# Patient Record
Sex: Female | Born: 1935 | Race: White | Hispanic: No | Smoking: Former smoker
Health system: Southern US, Community
[De-identification: ages and names within clinical notes are randomized; demographics above are authoritative.]

## PROBLEM LIST (undated history)

## (undated) DIAGNOSIS — I639 Cerebral infarction, unspecified: Secondary | ICD-10-CM

## (undated) DIAGNOSIS — E032 Hypothyroidism due to medicaments and other exogenous substances: Secondary | ICD-10-CM

## (undated) DIAGNOSIS — H409 Unspecified glaucoma: Secondary | ICD-10-CM

## (undated) DIAGNOSIS — I10 Essential (primary) hypertension: Secondary | ICD-10-CM

## (undated) DIAGNOSIS — I4891 Unspecified atrial fibrillation: Principal | ICD-10-CM

## (undated) DIAGNOSIS — C801 Malignant (primary) neoplasm, unspecified: Secondary | ICD-10-CM

## (undated) DIAGNOSIS — E079 Disorder of thyroid, unspecified: Secondary | ICD-10-CM

## (undated) HISTORY — PX: THYROIDECTOMY: SHX17

## (undated) HISTORY — DX: Unspecified glaucoma: H40.9

## (undated) HISTORY — PX: ABDOMINAL HYSTERECTOMY: SHX81

## (undated) HISTORY — DX: Unspecified atrial fibrillation: I48.91

## (undated) HISTORY — PX: APPENDECTOMY: SHX54

## (undated) HISTORY — DX: Hypothyroidism due to medicaments and other exogenous substances: E03.2

## (undated) HISTORY — PX: CHOLECYSTECTOMY: SHX55

## (undated) HISTORY — PX: VARICOSE VEIN SURGERY: SHX832

---

## 1998-04-12 ENCOUNTER — Ambulatory Visit (HOSPITAL_COMMUNITY): Admission: RE | Admit: 1998-04-12 | Discharge: 1998-04-12 | Payer: Self-pay | Admitting: Neurosurgery

## 1998-10-30 ENCOUNTER — Emergency Department (HOSPITAL_COMMUNITY): Admission: EM | Admit: 1998-10-30 | Discharge: 1998-10-30 | Payer: Self-pay | Admitting: Emergency Medicine

## 1998-10-30 ENCOUNTER — Encounter: Payer: Self-pay | Admitting: Emergency Medicine

## 1999-03-22 ENCOUNTER — Encounter: Payer: Self-pay | Admitting: Neurosurgery

## 1999-03-22 ENCOUNTER — Ambulatory Visit (HOSPITAL_COMMUNITY): Admission: RE | Admit: 1999-03-22 | Discharge: 1999-03-22 | Payer: Self-pay | Admitting: Neurosurgery

## 2000-05-02 ENCOUNTER — Other Ambulatory Visit: Admission: RE | Admit: 2000-05-02 | Discharge: 2000-05-02 | Payer: Self-pay | Admitting: Internal Medicine

## 2000-05-04 ENCOUNTER — Encounter: Payer: Self-pay | Admitting: Neurosurgery

## 2000-05-04 ENCOUNTER — Ambulatory Visit (HOSPITAL_COMMUNITY): Admission: RE | Admit: 2000-05-04 | Discharge: 2000-05-04 | Payer: Self-pay | Admitting: Neurosurgery

## 2001-03-18 ENCOUNTER — Ambulatory Visit (HOSPITAL_COMMUNITY): Admission: RE | Admit: 2001-03-18 | Discharge: 2001-03-18 | Payer: Self-pay | Admitting: Gastroenterology

## 2001-03-18 ENCOUNTER — Encounter (INDEPENDENT_AMBULATORY_CARE_PROVIDER_SITE_OTHER): Payer: Self-pay | Admitting: Specialist

## 2001-05-28 ENCOUNTER — Encounter: Payer: Self-pay | Admitting: Neurosurgery

## 2001-05-28 ENCOUNTER — Ambulatory Visit (HOSPITAL_COMMUNITY): Admission: RE | Admit: 2001-05-28 | Discharge: 2001-05-28 | Payer: Self-pay | Admitting: Neurosurgery

## 2001-10-14 DIAGNOSIS — I639 Cerebral infarction, unspecified: Secondary | ICD-10-CM

## 2001-10-14 HISTORY — DX: Cerebral infarction, unspecified: I63.9

## 2002-02-18 ENCOUNTER — Inpatient Hospital Stay (HOSPITAL_COMMUNITY)
Admission: AD | Admit: 2002-02-18 | Discharge: 2002-03-24 | Payer: Self-pay | Admitting: Physical Medicine & Rehabilitation

## 2002-02-27 ENCOUNTER — Encounter: Payer: Self-pay | Admitting: Physical Medicine & Rehabilitation

## 2002-03-03 ENCOUNTER — Encounter: Payer: Self-pay | Admitting: Physical Medicine & Rehabilitation

## 2002-03-17 ENCOUNTER — Encounter: Payer: Self-pay | Admitting: Physical Medicine & Rehabilitation

## 2002-03-18 ENCOUNTER — Encounter: Payer: Self-pay | Admitting: Physical Medicine & Rehabilitation

## 2002-04-02 ENCOUNTER — Encounter: Payer: Self-pay | Admitting: Physical Medicine & Rehabilitation

## 2002-04-02 ENCOUNTER — Ambulatory Visit (HOSPITAL_COMMUNITY)
Admission: RE | Admit: 2002-04-02 | Discharge: 2002-04-02 | Payer: Self-pay | Admitting: Physical Medicine & Rehabilitation

## 2002-05-21 ENCOUNTER — Ambulatory Visit (HOSPITAL_COMMUNITY): Admission: RE | Admit: 2002-05-21 | Discharge: 2002-05-21 | Payer: Self-pay | Admitting: Neurosurgery

## 2002-05-21 ENCOUNTER — Encounter: Payer: Self-pay | Admitting: Neurosurgery

## 2002-09-16 ENCOUNTER — Encounter: Admission: RE | Admit: 2002-09-16 | Discharge: 2002-09-16 | Payer: Self-pay | Admitting: Internal Medicine

## 2002-09-16 ENCOUNTER — Encounter: Payer: Self-pay | Admitting: Internal Medicine

## 2003-06-06 ENCOUNTER — Encounter: Payer: Self-pay | Admitting: Neurosurgery

## 2003-06-06 ENCOUNTER — Ambulatory Visit (HOSPITAL_COMMUNITY): Admission: RE | Admit: 2003-06-06 | Discharge: 2003-06-06 | Payer: Self-pay | Admitting: Neurosurgery

## 2005-06-05 ENCOUNTER — Ambulatory Visit (HOSPITAL_COMMUNITY): Admission: RE | Admit: 2005-06-05 | Discharge: 2005-06-05 | Payer: Self-pay | Admitting: Neurosurgery

## 2007-06-02 ENCOUNTER — Encounter: Admission: RE | Admit: 2007-06-02 | Discharge: 2007-06-02 | Payer: Self-pay | Admitting: General Surgery

## 2009-09-12 ENCOUNTER — Ambulatory Visit (HOSPITAL_COMMUNITY): Admission: RE | Admit: 2009-09-12 | Discharge: 2009-09-13 | Payer: Self-pay | Admitting: Ophthalmology

## 2010-11-04 ENCOUNTER — Encounter: Payer: Self-pay | Admitting: Neurosurgery

## 2011-01-16 LAB — CBC
HCT: 42.9 % (ref 36.0–46.0)
MCV: 92.5 fL (ref 78.0–100.0)
Platelets: 247 10*3/uL (ref 150–400)
RDW: 12.8 % (ref 11.5–15.5)

## 2011-01-16 LAB — BASIC METABOLIC PANEL
BUN: 22 mg/dL (ref 6–23)
GFR calc non Af Amer: >60 mL/min (ref >60)
Glucose, Bld: 107 mg/dL — ABNORMAL HIGH (ref 70–99)
Potassium: 4.4 mEq/L (ref 3.5–5.1)

## 2011-03-01 NOTE — Consult Note (Signed)
[place]. [facility]  Patient:    Jillian Cantrell, Jillian Cantrell Visit Number: [id number] MRN: [id number]          Service Type: [facility] Location: 4000 [id number] Attending Physician:  Herold Harms Dictated by:   Georges Lynch Darrelyn Hillock, M.D. Proc. Date: 02/28/02 Admit Date:  02/18/2002                            Consultation Report  REASON FOR CONSULTATION:  I was called to see this patient today by Dr. Thomasena Edis from rehab on Feb 28, 2002. Apparently, she had a CVA that involved her right side. She had pain in her right shoulder. An x-ray was taken that showed a probable nondisplaced fracture of the humeral neck.  PAST MEDICAL HISTORY:  The past history is all well documented on the chart.  PHYSICAL EXAMINATION:  GENERAL:  From the orthopedic standpoint, she is unable to speak. Her husband is there to help [place] interpret as far as her problems.  RIGHT UPPER EXTREMITY:  She complained of pain in her right shoulder. She was unable to move her right upper extremity about. Her circulation in her hand is fine. In the right shoulder exam per se, there are no masses or tumors noted. No signs of any gross hematomas or swelling.  IMAGING STUDIES:  X-rays were suspicious for a nondisplaced humeral neck fracture on the right.  IMPRESSION:  A nondisplaced humeral neck fracture on the right.  TREATMENT:  Keep her in a shoulder mobilizer that she will need to be kept in for a few weeks, and we will follow her. Dictated by:   Georges Lynch Darrelyn Hillock, M.D. Attending Physician:  Herold Harms DD:  02/28/02 TD:  03/02/02 Job: [id number]

## 2011-03-01 NOTE — Discharge Summary (Signed)
[place]. [facility]  Patient:    Jillian Cantrell, Jillian Cantrell Visit Number: [id number] MRN: [id number]          Service Type: [facility] Location: 4000 [id number] Attending Physician:  Herold Harms Dictated by:   Mcarthur Rossetti. Angiulli, P.A. Admit Date:  02/18/2002 Disc. Date: 03/24/02   CC:         Georges Lynch. Darrelyn Hillock, M.D.   Discharge Summary  DISCHARGE DIAGNOSES: 1. 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 2. Aphasia with dysphasia secondary to cerebrovascular accident. 3. Hypertension. 4. Right humeral neck fracture. 5. Urinary tract infection, resolved. 6. Hypothyroidism. 7. Gastroesophageal reflux disease. 8. History of meningioma of the brain. 9. Osteoarthritis.  HISTORY OF PRESENT ILLNESS:  A 77 year old white female from [place], [place] admitted May 3 to [facility] in [place] with right-sided weakness.  The patient and husband had been repairing a beach home to rent for the summer.  On evaluation, cranial CT scan was negative for infarction or hemorrhage.  There was a question of an old stroke.  Follow-up cranial CT scan with left basal ganglia infarction, thalamic infarction.  She was placed on intravenous heparin.  Carotid duplex with 40% ICA stenosis. Echocardiogram with no thrombus.  Maintained on Plavix and subcutaneous Lovenox.  Modified Barium swallow showed aspiration of liquids and a nasogastric feeding was placed.  She was also on a pureed, honey-thick liquid diet.  She was placed on intravenous Cipro for a positive urinalysis.  Blood pressures monitored.  She was maximum assistance for sit to stand.  Latest chemistry was unremarkable.  She did have some complaints of right shoulder pain, where initial x-rays by report were negative.  She was admitted for comprehensive rehabilitation program.  PAST MEDICAL HISTORY: 1. Hypertension. 2. Hypothyroidism. 3. Gastroesophageal reflux disease. 4. History of  thyroid carcinoma, which she had no treatment per her son. 5. History of meningioma of the brain per son.  There was no surgery. She had    been seen by Dr. Ronaldo Miyamoto L. Cabbell of Neurosurgery at [facility] in    the past. 6. Osteoarthritis.  ALLERGIES:  IVP dye.  SOCIAL HISTORY:  She quit smoking 40 years ago, no alcohol.  Lives with husband in [place], [place].  Independent prior to admission.  One level home, four steps to entry.  Local son and daughter, can assist as needed on discharge.  MEDICATIONS:  (Prior to admission.) 1. Synthroid. 2. Hydrochlorothiazide. 3. Celebrex. 4. Remeron. 5. Ziac. 6. Aciphex.  HOSPITAL COURSE:  The patient with progressive ______ and rehabilitation services with therapies initiated on a b.i.d. basis.  The following issues were followed during patients ______ .  Pertaining to [name] left cerebrovascular accident remains stable.  Right-sided weakness with slow progress.  She continued on Plavix therapy.  Subcutaneous Lovenox was used for deep vein thrombosis throughout her rehabilitation course.  This was discontinued at time of discharge.  She was aphasic due to cerebrovascular accident.  She was able to use some simple words, answer yes, no appropriately.  She was essentially showing expressive aphasia.  She was followed by speech therapy for both her speech difficulties, as well as swallowing difficulties.  She was initially on nasogastric feeding tubes for aspiration secondary to cerebrovascular accident.  Her diet was slowly advanced.  Her nasogastric tube had been removed. She was on intravenous fluids for a short time to maintain hydration.  The latest modified Barium swallow on June 5 as her diet  was now advanced to a regular consistency with a nectar-thick liquids.  Intravenous have since been discontinued.  Follow-up chemistry showed the patient to be maintained with hydration.  She would receive follow-up speech  therapy for ongoing swallowing assessments.  Her blood pressures remained controlled throughout her rehabilitation course on a combination of Ziac and Vasotec.  There was no orthostatic changes.  She had completed a course of Cipro upon her admission to rehabilitation service for an E.coli urinary tract infection.  Follow-up urinalysis was negative. Ongoing complaints of right shoulder pain.  Initial x-rays at [facility] were negative.  Follow-up films at [facility] did reveal a nondisplaced right humeral neck fracture.  Orthopedic services were consulted on May 18, Dr. Windy Fast A. Gioffre, advisement of conservative care with shoulder sling.  Follow-up films March 17, 2002 showed healing of nondisplaced right humeral neck fracture.  She would follow up with orthopedic services for consideration of gentle range of motion.  This would be assessed as outpatient.  She continued her hormone supplement for hypothyroidism.  She had no gastrointestinal complaints.  She was maintained on Protonix with a history of gastroesophageal reflux disease.  Ongoing osteoarthritis, rheumatoid arthritis, low doses of Vioxx had been resumed.  Overall, for her functional mobility, she was minimum to moderate assist in all areas of activities of daily living of dressing and grooming.  She was maximum assist with a large base quad cane for her ambulation.  Supervision to minimal assist for her wheelchair mobility.  Full family teaching was completed.  Day passes went well with family.  Home therapies would continue to be addressed, as well as follow-up at the outpatient rehabilitation center with Dr. Rande Brunt. Collins.  A Foley catheter tube had remained in place until patients mobility was improved.  This was later removed with routine voiding schedules.  It was advised to family the need for routine skin checks.  Latest laboratories June 6 showed a sodium 141, potassium 4.0, BUN 17, creatinine 0.7,  hemoglobin 13.4, hematocrit 38.2.  DISCHARGE MEDICATIONS:  1. Plavix 75 mg daily. 2. Ziac 2.5-6.25 daily. 3. Vasotec 5 mg daily. 4. Synthroid 175 mcg daily. 5. Protonix 40 mg daily. 6. Zocor 20 mg daily. 7. Vioxx 12.5 mg daily. 8. Ensure pudding three times daily. 9. Tylenol as needed.  ACTIVITY:  As tolerated with assistance for safety.  DIET:  Regular consistency, nectar-thick liquids with chin tuck.  SPECIAL INSTRUCTIONS:  The patient should follow up with Dr. Georges Lynch. Gioffre of Orthopedic Services for nondisplaced right humeral neck fracture with shoulder sling in place for consideration for gentle range of motion.  The patient will see Dr. Reuel Boom L. Collins at the Outpatient Rehabilitation Service, [facility] [phone] with appointment to be made.  She should be maintained on current diet as noted above with follow-up speech therapy for repeat swallowing evaluation.  The patient was discharged to a skilled nursing facility on March 24, 2002. Dictated by:   Mcarthur Rossetti. Angiulli, P.A. Attending Physician:  Herold Harms DD:  03/23/02 TD:  03/23/02 Job: [id number]

## 2011-07-01 ENCOUNTER — Encounter (INDEPENDENT_AMBULATORY_CARE_PROVIDER_SITE_OTHER): Payer: Medicare Other | Admitting: Ophthalmology

## 2011-07-01 DIAGNOSIS — H35039 Hypertensive retinopathy, unspecified eye: Secondary | ICD-10-CM

## 2011-07-01 DIAGNOSIS — H35349 Macular cyst, hole, or pseudohole, unspecified eye: Secondary | ICD-10-CM

## 2011-07-01 DIAGNOSIS — H43819 Vitreous degeneration, unspecified eye: Secondary | ICD-10-CM

## 2011-10-22 DIAGNOSIS — H409 Unspecified glaucoma: Secondary | ICD-10-CM | POA: Diagnosis not present

## 2011-10-22 DIAGNOSIS — H4011X0 Primary open-angle glaucoma, stage unspecified: Secondary | ICD-10-CM | POA: Diagnosis not present

## 2011-12-13 DIAGNOSIS — E039 Hypothyroidism, unspecified: Secondary | ICD-10-CM | POA: Diagnosis not present

## 2011-12-13 DIAGNOSIS — E785 Hyperlipidemia, unspecified: Secondary | ICD-10-CM | POA: Diagnosis not present

## 2011-12-13 DIAGNOSIS — I69959 Hemiplegia and hemiparesis following unspecified cerebrovascular disease affecting unspecified side: Secondary | ICD-10-CM | POA: Diagnosis not present

## 2011-12-13 DIAGNOSIS — I10 Essential (primary) hypertension: Secondary | ICD-10-CM | POA: Diagnosis not present

## 2012-02-26 DIAGNOSIS — H409 Unspecified glaucoma: Secondary | ICD-10-CM | POA: Diagnosis not present

## 2012-02-26 DIAGNOSIS — H4011X0 Primary open-angle glaucoma, stage unspecified: Secondary | ICD-10-CM | POA: Diagnosis not present

## 2012-03-30 ENCOUNTER — Encounter (INDEPENDENT_AMBULATORY_CARE_PROVIDER_SITE_OTHER): Payer: Medicare Other | Admitting: Ophthalmology

## 2012-03-30 DIAGNOSIS — H43819 Vitreous degeneration, unspecified eye: Secondary | ICD-10-CM

## 2012-03-30 DIAGNOSIS — H35379 Puckering of macula, unspecified eye: Secondary | ICD-10-CM | POA: Diagnosis not present

## 2012-03-30 DIAGNOSIS — I10 Essential (primary) hypertension: Secondary | ICD-10-CM

## 2012-03-30 DIAGNOSIS — H35359 Cystoid macular degeneration, unspecified eye: Secondary | ICD-10-CM | POA: Diagnosis not present

## 2012-03-30 DIAGNOSIS — H35039 Hypertensive retinopathy, unspecified eye: Secondary | ICD-10-CM | POA: Diagnosis not present

## 2012-05-06 DIAGNOSIS — H16229 Keratoconjunctivitis sicca, not specified as Sjogren's, unspecified eye: Secondary | ICD-10-CM | POA: Diagnosis not present

## 2012-05-06 DIAGNOSIS — H5200 Hypermetropia, unspecified eye: Secondary | ICD-10-CM | POA: Diagnosis not present

## 2012-05-06 DIAGNOSIS — H04129 Dry eye syndrome of unspecified lacrimal gland: Secondary | ICD-10-CM | POA: Diagnosis not present

## 2012-05-06 DIAGNOSIS — H52229 Regular astigmatism, unspecified eye: Secondary | ICD-10-CM | POA: Diagnosis not present

## 2012-05-14 DIAGNOSIS — N6489 Other specified disorders of breast: Secondary | ICD-10-CM | POA: Diagnosis not present

## 2012-06-05 DIAGNOSIS — C73 Malignant neoplasm of thyroid gland: Secondary | ICD-10-CM | POA: Diagnosis not present

## 2012-06-05 DIAGNOSIS — E785 Hyperlipidemia, unspecified: Secondary | ICD-10-CM | POA: Diagnosis not present

## 2012-06-05 DIAGNOSIS — I10 Essential (primary) hypertension: Secondary | ICD-10-CM | POA: Diagnosis not present

## 2012-06-05 DIAGNOSIS — R82998 Other abnormal findings in urine: Secondary | ICD-10-CM | POA: Diagnosis not present

## 2012-06-12 DIAGNOSIS — E785 Hyperlipidemia, unspecified: Secondary | ICD-10-CM | POA: Diagnosis not present

## 2012-06-12 DIAGNOSIS — I10 Essential (primary) hypertension: Secondary | ICD-10-CM | POA: Diagnosis not present

## 2012-06-12 DIAGNOSIS — E039 Hypothyroidism, unspecified: Secondary | ICD-10-CM | POA: Diagnosis not present

## 2012-06-12 DIAGNOSIS — Z0000 Encounter for general adult medical examination without abnormal findings: Secondary | ICD-10-CM | POA: Diagnosis not present

## 2012-06-16 DIAGNOSIS — Z23 Encounter for immunization: Secondary | ICD-10-CM | POA: Diagnosis not present

## 2012-06-16 DIAGNOSIS — Z1212 Encounter for screening for malignant neoplasm of rectum: Secondary | ICD-10-CM | POA: Diagnosis not present

## 2012-07-01 DIAGNOSIS — Z23 Encounter for immunization: Secondary | ICD-10-CM | POA: Diagnosis not present

## 2012-07-27 DIAGNOSIS — L02619 Cutaneous abscess of unspecified foot: Secondary | ICD-10-CM | POA: Diagnosis not present

## 2012-08-07 DIAGNOSIS — L84 Corns and callosities: Secondary | ICD-10-CM | POA: Diagnosis not present

## 2012-08-12 DIAGNOSIS — H52229 Regular astigmatism, unspecified eye: Secondary | ICD-10-CM | POA: Diagnosis not present

## 2012-08-12 DIAGNOSIS — Z961 Presence of intraocular lens: Secondary | ICD-10-CM | POA: Diagnosis not present

## 2012-08-12 DIAGNOSIS — H524 Presbyopia: Secondary | ICD-10-CM | POA: Diagnosis not present

## 2012-08-12 DIAGNOSIS — H5231 Anisometropia: Secondary | ICD-10-CM | POA: Diagnosis not present

## 2012-08-14 ENCOUNTER — Ambulatory Visit (INDEPENDENT_AMBULATORY_CARE_PROVIDER_SITE_OTHER): Payer: Medicare Other | Admitting: *Deleted

## 2012-08-14 DIAGNOSIS — I6350 Cerebral infarction due to unspecified occlusion or stenosis of unspecified cerebral artery: Secondary | ICD-10-CM

## 2012-08-14 DIAGNOSIS — Z8673 Personal history of transient ischemic attack (TIA), and cerebral infarction without residual deficits: Secondary | ICD-10-CM

## 2012-08-14 DIAGNOSIS — R9389 Abnormal findings on diagnostic imaging of other specified body structures: Secondary | ICD-10-CM

## 2012-10-01 DIAGNOSIS — H52229 Regular astigmatism, unspecified eye: Secondary | ICD-10-CM | POA: Diagnosis not present

## 2012-10-01 DIAGNOSIS — H5200 Hypermetropia, unspecified eye: Secondary | ICD-10-CM | POA: Diagnosis not present

## 2012-10-01 DIAGNOSIS — H524 Presbyopia: Secondary | ICD-10-CM | POA: Diagnosis not present

## 2012-10-01 DIAGNOSIS — H4011X0 Primary open-angle glaucoma, stage unspecified: Secondary | ICD-10-CM | POA: Diagnosis not present

## 2012-12-09 DIAGNOSIS — E039 Hypothyroidism, unspecified: Secondary | ICD-10-CM | POA: Diagnosis not present

## 2012-12-09 DIAGNOSIS — E785 Hyperlipidemia, unspecified: Secondary | ICD-10-CM | POA: Diagnosis not present

## 2012-12-09 DIAGNOSIS — I10 Essential (primary) hypertension: Secondary | ICD-10-CM | POA: Diagnosis not present

## 2012-12-09 DIAGNOSIS — Z1331 Encounter for screening for depression: Secondary | ICD-10-CM | POA: Diagnosis not present

## 2012-12-28 ENCOUNTER — Ambulatory Visit (INDEPENDENT_AMBULATORY_CARE_PROVIDER_SITE_OTHER): Payer: Medicare Other | Admitting: Ophthalmology

## 2012-12-28 DIAGNOSIS — H35379 Puckering of macula, unspecified eye: Secondary | ICD-10-CM

## 2012-12-28 DIAGNOSIS — H35359 Cystoid macular degeneration, unspecified eye: Secondary | ICD-10-CM

## 2012-12-28 DIAGNOSIS — H43819 Vitreous degeneration, unspecified eye: Secondary | ICD-10-CM

## 2012-12-28 DIAGNOSIS — I10 Essential (primary) hypertension: Secondary | ICD-10-CM

## 2012-12-28 DIAGNOSIS — H35039 Hypertensive retinopathy, unspecified eye: Secondary | ICD-10-CM

## 2013-02-02 DIAGNOSIS — H409 Unspecified glaucoma: Secondary | ICD-10-CM | POA: Diagnosis not present

## 2013-02-02 DIAGNOSIS — H4011X0 Primary open-angle glaucoma, stage unspecified: Secondary | ICD-10-CM | POA: Diagnosis not present

## 2013-05-05 DIAGNOSIS — I10 Essential (primary) hypertension: Secondary | ICD-10-CM | POA: Diagnosis not present

## 2013-05-05 DIAGNOSIS — Z6826 Body mass index (BMI) 26.0-26.9, adult: Secondary | ICD-10-CM | POA: Diagnosis not present

## 2013-05-05 DIAGNOSIS — R599 Enlarged lymph nodes, unspecified: Secondary | ICD-10-CM | POA: Diagnosis not present

## 2013-05-17 DIAGNOSIS — N6459 Other signs and symptoms in breast: Secondary | ICD-10-CM | POA: Diagnosis not present

## 2013-06-01 DIAGNOSIS — H409 Unspecified glaucoma: Secondary | ICD-10-CM | POA: Diagnosis not present

## 2013-06-01 DIAGNOSIS — H4011X0 Primary open-angle glaucoma, stage unspecified: Secondary | ICD-10-CM | POA: Diagnosis not present

## 2013-06-09 DIAGNOSIS — E785 Hyperlipidemia, unspecified: Secondary | ICD-10-CM | POA: Diagnosis not present

## 2013-06-09 DIAGNOSIS — I10 Essential (primary) hypertension: Secondary | ICD-10-CM | POA: Diagnosis not present

## 2013-06-09 DIAGNOSIS — R82998 Other abnormal findings in urine: Secondary | ICD-10-CM | POA: Diagnosis not present

## 2013-06-09 DIAGNOSIS — E039 Hypothyroidism, unspecified: Secondary | ICD-10-CM | POA: Diagnosis not present

## 2013-06-16 DIAGNOSIS — Z1212 Encounter for screening for malignant neoplasm of rectum: Secondary | ICD-10-CM | POA: Diagnosis not present

## 2013-06-16 DIAGNOSIS — I10 Essential (primary) hypertension: Secondary | ICD-10-CM | POA: Diagnosis not present

## 2013-06-16 DIAGNOSIS — M1990 Unspecified osteoarthritis, unspecified site: Secondary | ICD-10-CM | POA: Diagnosis not present

## 2013-06-16 DIAGNOSIS — E785 Hyperlipidemia, unspecified: Secondary | ICD-10-CM | POA: Diagnosis not present

## 2013-06-16 DIAGNOSIS — E039 Hypothyroidism, unspecified: Secondary | ICD-10-CM | POA: Diagnosis not present

## 2013-06-16 DIAGNOSIS — I69959 Hemiplegia and hemiparesis following unspecified cerebrovascular disease affecting unspecified side: Secondary | ICD-10-CM | POA: Diagnosis not present

## 2013-06-16 DIAGNOSIS — Z6826 Body mass index (BMI) 26.0-26.9, adult: Secondary | ICD-10-CM | POA: Diagnosis not present

## 2013-06-16 DIAGNOSIS — Z0000 Encounter for general adult medical examination without abnormal findings: Secondary | ICD-10-CM | POA: Diagnosis not present

## 2013-06-24 DIAGNOSIS — Z1212 Encounter for screening for malignant neoplasm of rectum: Secondary | ICD-10-CM | POA: Diagnosis not present

## 2013-07-29 DIAGNOSIS — Z23 Encounter for immunization: Secondary | ICD-10-CM | POA: Diagnosis not present

## 2013-10-09 DIAGNOSIS — L259 Unspecified contact dermatitis, unspecified cause: Secondary | ICD-10-CM | POA: Diagnosis not present

## 2013-10-19 DIAGNOSIS — H4010X0 Unspecified open-angle glaucoma, stage unspecified: Secondary | ICD-10-CM | POA: Diagnosis not present

## 2013-10-19 DIAGNOSIS — Z961 Presence of intraocular lens: Secondary | ICD-10-CM | POA: Diagnosis not present

## 2013-10-19 DIAGNOSIS — H4011X0 Primary open-angle glaucoma, stage unspecified: Secondary | ICD-10-CM | POA: Diagnosis not present

## 2013-11-22 DIAGNOSIS — I10 Essential (primary) hypertension: Secondary | ICD-10-CM | POA: Diagnosis not present

## 2013-11-22 DIAGNOSIS — R040 Epistaxis: Secondary | ICD-10-CM | POA: Diagnosis not present

## 2013-12-22 DIAGNOSIS — E039 Hypothyroidism, unspecified: Secondary | ICD-10-CM | POA: Diagnosis not present

## 2013-12-22 DIAGNOSIS — I10 Essential (primary) hypertension: Secondary | ICD-10-CM | POA: Diagnosis not present

## 2013-12-22 DIAGNOSIS — I69959 Hemiplegia and hemiparesis following unspecified cerebrovascular disease affecting unspecified side: Secondary | ICD-10-CM | POA: Diagnosis not present

## 2013-12-22 DIAGNOSIS — M1990 Unspecified osteoarthritis, unspecified site: Secondary | ICD-10-CM | POA: Diagnosis not present

## 2013-12-22 DIAGNOSIS — Z6825 Body mass index (BMI) 25.0-25.9, adult: Secondary | ICD-10-CM | POA: Diagnosis not present

## 2013-12-22 DIAGNOSIS — E785 Hyperlipidemia, unspecified: Secondary | ICD-10-CM | POA: Diagnosis not present

## 2013-12-22 DIAGNOSIS — Z1331 Encounter for screening for depression: Secondary | ICD-10-CM | POA: Diagnosis not present

## 2013-12-29 ENCOUNTER — Ambulatory Visit (INDEPENDENT_AMBULATORY_CARE_PROVIDER_SITE_OTHER): Payer: Self-pay | Admitting: Ophthalmology

## 2014-01-04 DIAGNOSIS — H52229 Regular astigmatism, unspecified eye: Secondary | ICD-10-CM | POA: Diagnosis not present

## 2014-01-04 DIAGNOSIS — H35379 Puckering of macula, unspecified eye: Secondary | ICD-10-CM | POA: Diagnosis not present

## 2014-01-04 DIAGNOSIS — H524 Presbyopia: Secondary | ICD-10-CM | POA: Diagnosis not present

## 2014-01-04 DIAGNOSIS — Z961 Presence of intraocular lens: Secondary | ICD-10-CM | POA: Diagnosis not present

## 2014-01-04 DIAGNOSIS — H5210 Myopia, unspecified eye: Secondary | ICD-10-CM | POA: Diagnosis not present

## 2014-02-17 ENCOUNTER — Ambulatory Visit (INDEPENDENT_AMBULATORY_CARE_PROVIDER_SITE_OTHER): Payer: Medicare Other | Admitting: Ophthalmology

## 2014-02-17 DIAGNOSIS — H35379 Puckering of macula, unspecified eye: Secondary | ICD-10-CM | POA: Diagnosis not present

## 2014-02-17 DIAGNOSIS — H43819 Vitreous degeneration, unspecified eye: Secondary | ICD-10-CM | POA: Diagnosis not present

## 2014-02-17 DIAGNOSIS — H35039 Hypertensive retinopathy, unspecified eye: Secondary | ICD-10-CM

## 2014-02-17 DIAGNOSIS — I10 Essential (primary) hypertension: Secondary | ICD-10-CM | POA: Diagnosis not present

## 2014-03-25 DIAGNOSIS — Z6826 Body mass index (BMI) 26.0-26.9, adult: Secondary | ICD-10-CM | POA: Diagnosis not present

## 2014-03-25 DIAGNOSIS — R0789 Other chest pain: Secondary | ICD-10-CM | POA: Diagnosis not present

## 2014-03-25 DIAGNOSIS — I69959 Hemiplegia and hemiparesis following unspecified cerebrovascular disease affecting unspecified side: Secondary | ICD-10-CM | POA: Diagnosis not present

## 2014-04-28 DIAGNOSIS — H409 Unspecified glaucoma: Secondary | ICD-10-CM | POA: Diagnosis not present

## 2014-04-28 DIAGNOSIS — H4011X0 Primary open-angle glaucoma, stage unspecified: Secondary | ICD-10-CM | POA: Diagnosis not present

## 2014-05-18 DIAGNOSIS — Z803 Family history of malignant neoplasm of breast: Secondary | ICD-10-CM | POA: Diagnosis not present

## 2014-05-18 DIAGNOSIS — Z1231 Encounter for screening mammogram for malignant neoplasm of breast: Secondary | ICD-10-CM | POA: Diagnosis not present

## 2014-06-01 DIAGNOSIS — M1990 Unspecified osteoarthritis, unspecified site: Secondary | ICD-10-CM | POA: Diagnosis not present

## 2014-06-01 DIAGNOSIS — H9190 Unspecified hearing loss, unspecified ear: Secondary | ICD-10-CM | POA: Diagnosis not present

## 2014-06-01 DIAGNOSIS — I10 Essential (primary) hypertension: Secondary | ICD-10-CM | POA: Diagnosis not present

## 2014-06-01 DIAGNOSIS — H6120 Impacted cerumen, unspecified ear: Secondary | ICD-10-CM | POA: Diagnosis not present

## 2014-06-01 DIAGNOSIS — Z6826 Body mass index (BMI) 26.0-26.9, adult: Secondary | ICD-10-CM | POA: Diagnosis not present

## 2014-06-14 DIAGNOSIS — R82998 Other abnormal findings in urine: Secondary | ICD-10-CM | POA: Diagnosis not present

## 2014-06-14 DIAGNOSIS — E785 Hyperlipidemia, unspecified: Secondary | ICD-10-CM | POA: Diagnosis not present

## 2014-06-14 DIAGNOSIS — E039 Hypothyroidism, unspecified: Secondary | ICD-10-CM | POA: Diagnosis not present

## 2014-06-14 DIAGNOSIS — I10 Essential (primary) hypertension: Secondary | ICD-10-CM | POA: Diagnosis not present

## 2014-06-24 DIAGNOSIS — I10 Essential (primary) hypertension: Secondary | ICD-10-CM | POA: Diagnosis not present

## 2014-06-24 DIAGNOSIS — I69959 Hemiplegia and hemiparesis following unspecified cerebrovascular disease affecting unspecified side: Secondary | ICD-10-CM | POA: Diagnosis not present

## 2014-06-24 DIAGNOSIS — Z0000 Encounter for general adult medical examination without abnormal findings: Secondary | ICD-10-CM | POA: Diagnosis not present

## 2014-06-24 DIAGNOSIS — E039 Hypothyroidism, unspecified: Secondary | ICD-10-CM | POA: Diagnosis not present

## 2014-06-24 DIAGNOSIS — Z6826 Body mass index (BMI) 26.0-26.9, adult: Secondary | ICD-10-CM | POA: Diagnosis not present

## 2014-06-24 DIAGNOSIS — E785 Hyperlipidemia, unspecified: Secondary | ICD-10-CM | POA: Diagnosis not present

## 2014-06-24 DIAGNOSIS — M1990 Unspecified osteoarthritis, unspecified site: Secondary | ICD-10-CM | POA: Diagnosis not present

## 2014-06-24 DIAGNOSIS — Z23 Encounter for immunization: Secondary | ICD-10-CM | POA: Diagnosis not present

## 2014-07-12 DIAGNOSIS — Z23 Encounter for immunization: Secondary | ICD-10-CM | POA: Diagnosis not present

## 2014-08-30 DIAGNOSIS — K432 Incisional hernia without obstruction or gangrene: Secondary | ICD-10-CM | POA: Diagnosis not present

## 2014-08-30 DIAGNOSIS — Z6826 Body mass index (BMI) 26.0-26.9, adult: Secondary | ICD-10-CM | POA: Diagnosis not present

## 2014-08-30 DIAGNOSIS — W19XXXA Unspecified fall, initial encounter: Secondary | ICD-10-CM | POA: Diagnosis not present

## 2014-11-14 ENCOUNTER — Ambulatory Visit: Payer: Medicare Other | Admitting: Podiatry

## 2014-11-23 DIAGNOSIS — H4011X2 Primary open-angle glaucoma, moderate stage: Secondary | ICD-10-CM | POA: Diagnosis not present

## 2014-12-21 DIAGNOSIS — Z6826 Body mass index (BMI) 26.0-26.9, adult: Secondary | ICD-10-CM | POA: Diagnosis not present

## 2014-12-21 DIAGNOSIS — M1990 Unspecified osteoarthritis, unspecified site: Secondary | ICD-10-CM | POA: Diagnosis not present

## 2014-12-21 DIAGNOSIS — E785 Hyperlipidemia, unspecified: Secondary | ICD-10-CM | POA: Diagnosis not present

## 2014-12-21 DIAGNOSIS — I69959 Hemiplegia and hemiparesis following unspecified cerebrovascular disease affecting unspecified side: Secondary | ICD-10-CM | POA: Diagnosis not present

## 2014-12-21 DIAGNOSIS — I10 Essential (primary) hypertension: Secondary | ICD-10-CM | POA: Diagnosis not present

## 2014-12-21 DIAGNOSIS — E039 Hypothyroidism, unspecified: Secondary | ICD-10-CM | POA: Diagnosis not present

## 2014-12-21 DIAGNOSIS — Z1389 Encounter for screening for other disorder: Secondary | ICD-10-CM | POA: Diagnosis not present

## 2015-01-07 ENCOUNTER — Emergency Department (HOSPITAL_COMMUNITY): Payer: Medicare Other

## 2015-01-07 ENCOUNTER — Encounter (HOSPITAL_COMMUNITY): Payer: Self-pay | Admitting: *Deleted

## 2015-01-07 ENCOUNTER — Emergency Department (HOSPITAL_COMMUNITY)
Admission: EM | Admit: 2015-01-07 | Discharge: 2015-01-07 | Disposition: A | Discharge status: Home / Self Care | Payer: Medicare Other | Attending: Emergency Medicine | Admitting: Emergency Medicine

## 2015-01-07 DIAGNOSIS — S59901A Unspecified injury of right elbow, initial encounter: Secondary | ICD-10-CM | POA: Diagnosis not present

## 2015-01-07 DIAGNOSIS — E079 Disorder of thyroid, unspecified: Secondary | ICD-10-CM | POA: Insufficient documentation

## 2015-01-07 DIAGNOSIS — Z87891 Personal history of nicotine dependence: Secondary | ICD-10-CM | POA: Diagnosis not present

## 2015-01-07 DIAGNOSIS — S3991XA Unspecified injury of abdomen, initial encounter: Secondary | ICD-10-CM | POA: Diagnosis not present

## 2015-01-07 DIAGNOSIS — Y9389 Activity, other specified: Secondary | ICD-10-CM | POA: Diagnosis not present

## 2015-01-07 DIAGNOSIS — S3993XA Unspecified injury of pelvis, initial encounter: Secondary | ICD-10-CM | POA: Diagnosis not present

## 2015-01-07 DIAGNOSIS — Z85850 Personal history of malignant neoplasm of thyroid: Secondary | ICD-10-CM | POA: Insufficient documentation

## 2015-01-07 DIAGNOSIS — Z7982 Long term (current) use of aspirin: Secondary | ICD-10-CM | POA: Insufficient documentation

## 2015-01-07 DIAGNOSIS — Y998 Other external cause status: Secondary | ICD-10-CM | POA: Diagnosis not present

## 2015-01-07 DIAGNOSIS — S301XXA Contusion of abdominal wall, initial encounter: Secondary | ICD-10-CM | POA: Insufficient documentation

## 2015-01-07 DIAGNOSIS — Z79899 Other long term (current) drug therapy: Secondary | ICD-10-CM | POA: Insufficient documentation

## 2015-01-07 DIAGNOSIS — Z791 Long term (current) use of non-steroidal anti-inflammatories (NSAID): Secondary | ICD-10-CM | POA: Diagnosis not present

## 2015-01-07 DIAGNOSIS — E876 Hypokalemia: Secondary | ICD-10-CM | POA: Diagnosis not present

## 2015-01-07 DIAGNOSIS — Z8673 Personal history of transient ischemic attack (TIA), and cerebral infarction without residual deficits: Secondary | ICD-10-CM | POA: Insufficient documentation

## 2015-01-07 DIAGNOSIS — Y9289 Other specified places as the place of occurrence of the external cause: Secondary | ICD-10-CM | POA: Insufficient documentation

## 2015-01-07 DIAGNOSIS — I10 Essential (primary) hypertension: Secondary | ICD-10-CM | POA: Insufficient documentation

## 2015-01-07 DIAGNOSIS — R109 Unspecified abdominal pain: Secondary | ICD-10-CM

## 2015-01-07 DIAGNOSIS — Z7902 Long term (current) use of antithrombotics/antiplatelets: Secondary | ICD-10-CM | POA: Insufficient documentation

## 2015-01-07 DIAGNOSIS — S2231XA Fracture of one rib, right side, initial encounter for closed fracture: Secondary | ICD-10-CM | POA: Diagnosis not present

## 2015-01-07 DIAGNOSIS — S299XXA Unspecified injury of thorax, initial encounter: Secondary | ICD-10-CM | POA: Diagnosis present

## 2015-01-07 DIAGNOSIS — W1839XA Other fall on same level, initial encounter: Secondary | ICD-10-CM | POA: Diagnosis not present

## 2015-01-07 DIAGNOSIS — W19XXXA Unspecified fall, initial encounter: Secondary | ICD-10-CM

## 2015-01-07 DIAGNOSIS — R1031 Right lower quadrant pain: Secondary | ICD-10-CM | POA: Diagnosis not present

## 2015-01-07 DIAGNOSIS — M25521 Pain in right elbow: Secondary | ICD-10-CM | POA: Diagnosis not present

## 2015-01-07 HISTORY — DX: Disorder of thyroid, unspecified: E07.9

## 2015-01-07 HISTORY — DX: Essential (primary) hypertension: I10

## 2015-01-07 HISTORY — DX: Cerebral infarction, unspecified: I63.9

## 2015-01-07 HISTORY — DX: Malignant (primary) neoplasm, unspecified: C80.1

## 2015-01-07 LAB — COMPREHENSIVE METABOLIC PANEL
ALT: 14 U/L (ref 0–35)
AST: 18 U/L (ref 0–37)
Albumin: 3.6 g/dL (ref 3.5–5.2)
Alkaline Phosphatase: 105 U/L (ref 39–117)
Anion gap: 10 (ref 5–15)
BUN: 33 mg/dL — ABNORMAL HIGH (ref 6–23)
CHLORIDE: 99 mmol/L (ref 96–112)
CO2: 30 mmol/L (ref 19–32)
CREATININE: 1.08 mg/dL (ref 0.50–1.10)
Calcium: 8.9 mg/dL (ref 8.4–10.5)
GFR calc non Af Amer: 45 mL/min — ABNORMAL LOW (ref >90)
GFR, EST AFRICAN AMERICAN: 53 mL/min — AB (ref >90)
Glucose, Bld: 97 mg/dL (ref 70–99)
POTASSIUM: 3 mmol/L — AB (ref 3.5–5.1)
SODIUM: 139 mmol/L (ref 135–145)
Total Bilirubin: 0.7 mg/dL (ref 0.3–1.2)
Total Protein: 6.4 g/dL (ref 6.0–8.3)

## 2015-01-07 LAB — CBC WITH DIFFERENTIAL/PLATELET
Basophils Absolute: 0 10*3/uL (ref 0.0–0.1)
Basophils Relative: 0 % (ref 0–1)
EOS ABS: 0.2 10*3/uL (ref 0.0–0.7)
Eosinophils Relative: 2 % (ref 0–5)
HCT: 39.5 % (ref 36.0–46.0)
Hemoglobin: 13.4 g/dL (ref 12.0–15.0)
LYMPHS ABS: 1.9 10*3/uL (ref 0.7–4.0)
Lymphocytes Relative: 20 % (ref 12–46)
MCH: 31.5 pg (ref 26.0–34.0)
MCHC: 33.9 g/dL (ref 30.0–36.0)
MCV: 92.9 fL (ref 78.0–100.0)
MONO ABS: 1.1 10*3/uL — AB (ref 0.1–1.0)
Monocytes Relative: 11 % (ref 3–12)
NEUTROS PCT: 67 % (ref 43–77)
Neutro Abs: 6.3 10*3/uL (ref 1.7–7.7)
PLATELETS: 246 10*3/uL (ref 150–400)
RBC: 4.25 MIL/uL (ref 3.87–5.11)
RDW: 13.2 % (ref 11.5–15.5)
WBC: 9.5 10*3/uL (ref 4.0–10.5)

## 2015-01-07 MED ORDER — POTASSIUM CHLORIDE CRYS ER 20 MEQ PO TBCR: 40.0000 meq | EXTENDED_RELEASE_TABLET | Freq: Once | ORAL | Status: DC

## 2015-01-07 MED ORDER — IOHEXOL 300 MG/ML  SOLN
100.0000 mL | Freq: Once | INTRAMUSCULAR | Status: AC | PRN
  Administered 2015-01-07: 80 mL via INTRAVENOUS

## 2015-01-07 MED ORDER — TRAMADOL HCL 50 MG PO TABS: 50.0000 mg | ORAL_TABLET | Freq: Four times a day (QID) | ORAL | Status: DC | PRN

## 2015-01-07 NOTE — ED Provider Notes (Signed)
CSN: [id number]     Arrival date & time 01/07/15  1412 History   First MD Initiated Contact with Patient 01/07/15 1500     Chief Complaint  Patient presents with  . Fall  . Rib Pain      (Consider location/radiation/quality/duration/timing/severity/associated sxs/prior Treatment) HPI Comments: Patient with a history of CVA with some residual right sided weakness presents today with a chief complaint of right lower rib pain.  Husband reports that she fell 10 days ago.  Patient reports that her feet didn't move and got caught causing her to fall.  She hit the right side of her chest and the right side of her abdomen on a piano bench when she fell.  She reports that the pain began 2-3 days after the fall.  Pain is worse with movement.  She does not feel that the pain is any worse with taking a deep breath.  She has not taken anything for pain prior to arrival.  Both patient and husband do not think that she hit her head.  No LOC.   Husband reports that he has not noticed any increased confusion.  No SOB, headache, cough, nausea, vomiting, vision changes, neck pain, back pain, or hematuria.  She is currently on Plavix and 81 mg ASA daily.    The history is provided by the patient.    Past Medical History  Diagnosis Date  . Stroke   . Hypertension   . Thyroid disease   . Cancer     thyroid cancer s/p thyroidectomy   Past Surgical History  Procedure Laterality Date  . Cholecystectomy    . Abdominal hysterectomy    . Appendectomy    . Varicose vein surgery    . Thyroidectomy     No family history on file. History  Substance Use Topics  . Smoking status: Former Smoker    Quit date: 10/14/1968  . Smokeless tobacco: Not on file  . Alcohol Use: No   OB History    No data available     Review of Systems  All other systems reviewed and are negative.     Allergies  Codeine  Home Medications   Prior to Admission medications   Medication Sig Start Date End Date Taking?  Authorizing Provider  aspirin 81 MG EC tablet Take 81 mg by mouth daily. Swallow whole.   Yes Historical Provider, MD  bisoprolol-hydrochlorothiazide (ZIAC) 2.5-6.25 MG per tablet Take 1 tablet by mouth daily.   Yes Historical Provider, MD  celecoxib (CELEBREX) 200 MG capsule Take 200 mg by mouth daily.   Yes Historical Provider, MD  clopidogrel (PLAVIX) 75 MG tablet Take 75 mg by mouth daily.   Yes Historical Provider, MD  enalapril (VASOTEC) 5 MG tablet Take 5 mg by mouth daily.   Yes Historical Provider, MD  furosemide (LASIX) 40 MG tablet Take 40 mg by mouth daily.   Yes Historical Provider, MD  levothyroxine (SYNTHROID, LEVOTHROID) 175 MCG tablet Take 175 mcg by mouth daily before breakfast.   Yes Historical Provider, MD  potassium chloride SA (K-DUR,KLOR-CON) 20 MEQ tablet Take 20 mEq by mouth daily.   Yes Historical Provider, MD  simvastatin (ZOCOR) 20 MG tablet Take 20 mg by mouth daily.   Yes Historical Provider, MD   BP 111/57 mmHg  Pulse 68  Temp(Src) 97.8 F (36.6 C) (Oral)  Resp 16  SpO2 97% Physical Exam  Constitutional: She appears well-developed and well-nourished.  HENT:  Head: Normocephalic and atraumatic.  Mouth/Throat: Oropharynx  is clear and moist.  Eyes: EOM are normal. Pupils are equal, round, and reactive to light.  Neck: Normal range of motion. Neck supple.  Cardiovascular: Normal rate, regular rhythm and normal heart sounds.   Pulmonary/Chest: Effort normal and breath sounds normal. She exhibits tenderness.  Tenderness to palpation of the right lower ribs both anteriorly and posteriorly  Abdominal: Soft. Bowel sounds are normal. She exhibits no distension and no mass. There is no tenderness. There is no rebound and no guarding.  Large ecchymosis of the right flank area extending to the area of the anterior lateral right lower ribs  Musculoskeletal: Normal range of motion.  Large ecchymosis of the right elbow.  Mild pain with movement of the right elbow   Neurological: She is alert. No cranial nerve deficit or sensory deficit.  Right UE weakness, which she reports is baseline.    Skin: Skin is warm and dry.  Psychiatric: She has a normal mood and affect.  Nursing note and vitals reviewed.   ED Course  Procedures (including critical care time) Labs Review Labs Reviewed - No data to display  Imaging Review Dg Elbow Complete Right  01/07/2015   CLINICAL DATA:  81 year old female with history of trauma from a fall on Wednesday or Thursday night (2-3 days ago), with injury to the right arm.  EXAM: RIGHT ELBOW - COMPLETE 3+ VIEW  COMPARISON:  No priors.  FINDINGS: Multiple views of the right elbow demonstrate no acute displaced fracture, subluxation, dislocation, or soft tissue abnormality.  IMPRESSION: No acute radiographic abnormality of the right elbow.   Electronically Signed   By: Vinnie Langton M.D.   On: 01/07/2015 17:03   Ct Chest W Contrast  01/07/2015   CLINICAL DATA:  Fall. Question right rib fracture. Right side hematoma. Fall 4 days ago.  EXAM: CT CHEST, ABDOMEN, AND PELVIS WITH CONTRAST  TECHNIQUE: Multidetector CT imaging of the chest, abdomen and pelvis was performed following the standard protocol during bolus administration of intravenous contrast.  CONTRAST:  57mL OMNIPAQUE IOHEXOL 300 MG/ML  SOLN  COMPARISON:  None.  FINDINGS: CT CHEST FINDINGS  Mediastinum/Nodes: Mildly prominent AP window lymph nodes. Index node has a short axis diameter of 15 mm on image 22. Small and borderline sized left hilar lymph nodes with index node having a short axis diameter of 9 mm on image 29. Heart is upper limits normal in size. Coronary artery and aortic calcifications. No aneurysm. No pericardial effusion.  Lungs/Pleura: Dependent atelectasis bilaterally. Trace right pleural effusion. No pneumothorax.  Musculoskeletal: There is a right lateral ninth rib fracture. No additional acute bony abnormality.  CT ABDOMEN PELVIS FINDINGS  Hepatobiliary: No  biliary ductal dilatation or focal hepatic lesion. Prior cholecystectomy  Pancreas: No focal abnormality or ductal dilatation.  Spleen: Multiple low-density lesions throughout the spleen. These are nonspecific. The largest index lesion measures 2.1 cm posteriorly on image 50.  Adrenals/Urinary Tract: No focal renal or adrenal abnormality. No hydronephrosis. Urinary bladder is unremarkable.  Stomach/Bowel: Diverticulosis in the sigmoid colon and descending colon. No active diverticulitis. Stomach and small bowel are decompressed. 3.5 cm duodenal diverticulum in the region of pancreatic head. Appendix is visualized and unremarkable.  Vascular/Lymphatic: Aortic and iliac calcifications without aneurysm.  Reproductive: Prior hysterectomy.  No adnexal masses.  Other: No free fluid or free air.  Musculoskeletal: No focal bone lesion or acute bony abnormality.  IMPRESSION: Right lateral ninth rib fracture. No associated pneumothorax. Trace right pleural effusion and dependent atelectasis.  Numerous small low-density  lesions throughout the spleen, nonspecific. Differential considerations include benign processes such as hemangiomas, lymphangiomas, hamartomas, as well as malignant lesions such as lymphoma or metastases. In the absence of a cancer history and evidence of disease elsewhere, these are most likely benign.  Left colonic diverticulosis.   Electronically Signed   By: Rolm Baptise M.D.   On: 01/07/2015 17:46   Ct Abdomen Pelvis W Contrast  01/07/2015   CLINICAL DATA:  Fall. Question right rib fracture. Right side hematoma. Fall 4 days ago.  EXAM: CT CHEST, ABDOMEN, AND PELVIS WITH CONTRAST  TECHNIQUE: Multidetector CT imaging of the chest, abdomen and pelvis was performed following the standard protocol during bolus administration of intravenous contrast.  CONTRAST:  21mL OMNIPAQUE IOHEXOL 300 MG/ML  SOLN  COMPARISON:  None.  FINDINGS: CT CHEST FINDINGS  Mediastinum/Nodes: Mildly prominent AP window lymph nodes.  Index node has a short axis diameter of 15 mm on image 22. Small and borderline sized left hilar lymph nodes with index node having a short axis diameter of 9 mm on image 29. Heart is upper limits normal in size. Coronary artery and aortic calcifications. No aneurysm. No pericardial effusion.  Lungs/Pleura: Dependent atelectasis bilaterally. Trace right pleural effusion. No pneumothorax.  Musculoskeletal: There is a right lateral ninth rib fracture. No additional acute bony abnormality.  CT ABDOMEN PELVIS FINDINGS  Hepatobiliary: No biliary ductal dilatation or focal hepatic lesion. Prior cholecystectomy  Pancreas: No focal abnormality or ductal dilatation.  Spleen: Multiple low-density lesions throughout the spleen. These are nonspecific. The largest index lesion measures 2.1 cm posteriorly on image 50.  Adrenals/Urinary Tract: No focal renal or adrenal abnormality. No hydronephrosis. Urinary bladder is unremarkable.  Stomach/Bowel: Diverticulosis in the sigmoid colon and descending colon. No active diverticulitis. Stomach and small bowel are decompressed. 3.5 cm duodenal diverticulum in the region of pancreatic head. Appendix is visualized and unremarkable.  Vascular/Lymphatic: Aortic and iliac calcifications without aneurysm.  Reproductive: Prior hysterectomy.  No adnexal masses.  Other: No free fluid or free air.  Musculoskeletal: No focal bone lesion or acute bony abnormality.  IMPRESSION: Right lateral ninth rib fracture. No associated pneumothorax. Trace right pleural effusion and dependent atelectasis.  Numerous small low-density lesions throughout the spleen, nonspecific. Differential considerations include benign processes such as hemangiomas, lymphangiomas, hamartomas, as well as malignant lesions such as lymphoma or metastases. In the absence of a cancer history and evidence of disease elsewhere, these are most likely benign.  Left colonic diverticulosis.   Electronically Signed   By: Rolm Baptise M.D.    On: 01/07/2015 17:46     EKG Interpretation None      MDM   Final diagnoses:  None   Patient presents today with a chief complaint of right lower rib pain.  She reports that she fell ten days ago and hit her right side on a piano bench.  Fall was mechanical.  No signs of head injury on exam.  No spinal tenderness.   Patient with a large area of ecchymosis of the right flank area extending to the right lower ribs.  CT chest showing fracture of the right lateral 9th rib.  No pneumothorax.  No signs of Pneumonia.  CT ab/pelvis showing small low density lesions throughout the spleen.  Discussed results with the patient.  Elbow xray also negative.  Patient stable for discharge.  Patient instructed to follow up with PCP.  Discharged with pain medications.      Hyman Bible, PA-C 01/07/15 2245  Evelina Bucy, MD  01/07/15 2309 

## 2015-01-07 NOTE — ED Notes (Signed)
Pt reports mechanical fall either Wed or Thurs night. [place] backwards into a piano bench on her R side, pain with movement, no additional pain with deep breathing. Large bruise noted to R flank area. Pt's spouse reports the area appeared swollen this am. Pt denies hitting head with fall. Had to have grandson come over to help her up.

## 2015-01-17 DIAGNOSIS — H4011X1 Primary open-angle glaucoma, mild stage: Secondary | ICD-10-CM | POA: Diagnosis not present

## 2015-01-17 DIAGNOSIS — H3531 Nonexudative age-related macular degeneration: Secondary | ICD-10-CM | POA: Diagnosis not present

## 2015-03-08 ENCOUNTER — Ambulatory Visit (INDEPENDENT_AMBULATORY_CARE_PROVIDER_SITE_OTHER): Payer: Medicare Other | Admitting: Ophthalmology

## 2015-03-08 DIAGNOSIS — H35033 Hypertensive retinopathy, bilateral: Secondary | ICD-10-CM

## 2015-03-08 DIAGNOSIS — I10 Essential (primary) hypertension: Secondary | ICD-10-CM

## 2015-03-08 DIAGNOSIS — H43812 Vitreous degeneration, left eye: Secondary | ICD-10-CM | POA: Diagnosis not present

## 2015-03-08 DIAGNOSIS — H35372 Puckering of macula, left eye: Secondary | ICD-10-CM | POA: Diagnosis not present

## 2015-06-20 DIAGNOSIS — R8290 Unspecified abnormal findings in urine: Secondary | ICD-10-CM | POA: Diagnosis not present

## 2015-06-20 DIAGNOSIS — Q150 Congenital glaucoma: Secondary | ICD-10-CM | POA: Diagnosis not present

## 2015-06-20 DIAGNOSIS — N390 Urinary tract infection, site not specified: Secondary | ICD-10-CM | POA: Diagnosis not present

## 2015-06-20 DIAGNOSIS — H4011X2 Primary open-angle glaucoma, moderate stage: Secondary | ICD-10-CM | POA: Diagnosis not present

## 2015-06-20 DIAGNOSIS — I10 Essential (primary) hypertension: Secondary | ICD-10-CM | POA: Diagnosis not present

## 2015-06-20 DIAGNOSIS — E039 Hypothyroidism, unspecified: Secondary | ICD-10-CM | POA: Diagnosis not present

## 2015-06-26 DIAGNOSIS — E039 Hypothyroidism, unspecified: Secondary | ICD-10-CM | POA: Diagnosis not present

## 2015-06-26 DIAGNOSIS — M1990 Unspecified osteoarthritis, unspecified site: Secondary | ICD-10-CM | POA: Diagnosis not present

## 2015-06-26 DIAGNOSIS — I129 Hypertensive chronic kidney disease with stage 1 through stage 4 chronic kidney disease, or unspecified chronic kidney disease: Secondary | ICD-10-CM | POA: Diagnosis not present

## 2015-06-26 DIAGNOSIS — E785 Hyperlipidemia, unspecified: Secondary | ICD-10-CM | POA: Diagnosis not present

## 2015-06-26 DIAGNOSIS — Z23 Encounter for immunization: Secondary | ICD-10-CM | POA: Diagnosis not present

## 2015-06-26 DIAGNOSIS — Z0000 Encounter for general adult medical examination without abnormal findings: Secondary | ICD-10-CM | POA: Diagnosis not present

## 2015-06-26 DIAGNOSIS — I10 Essential (primary) hypertension: Secondary | ICD-10-CM | POA: Diagnosis not present

## 2015-06-26 DIAGNOSIS — N183 Chronic kidney disease, stage 3 (moderate): Secondary | ICD-10-CM | POA: Diagnosis not present

## 2015-06-26 DIAGNOSIS — I69959 Hemiplegia and hemiparesis following unspecified cerebrovascular disease affecting unspecified side: Secondary | ICD-10-CM | POA: Diagnosis not present

## 2015-06-26 DIAGNOSIS — Z6826 Body mass index (BMI) 26.0-26.9, adult: Secondary | ICD-10-CM | POA: Diagnosis not present

## 2015-08-04 DIAGNOSIS — Z1212 Encounter for screening for malignant neoplasm of rectum: Secondary | ICD-10-CM | POA: Diagnosis not present

## 2015-10-10 DIAGNOSIS — Z872 Personal history of diseases of the skin and subcutaneous tissue: Secondary | ICD-10-CM | POA: Diagnosis not present

## 2015-10-10 DIAGNOSIS — Z6824 Body mass index (BMI) 24.0-24.9, adult: Secondary | ICD-10-CM | POA: Diagnosis not present

## 2015-10-10 DIAGNOSIS — L700 Acne vulgaris: Secondary | ICD-10-CM | POA: Diagnosis not present

## 2015-10-15 DIAGNOSIS — I4891 Unspecified atrial fibrillation: Secondary | ICD-10-CM

## 2015-10-15 HISTORY — DX: Unspecified atrial fibrillation: I48.91

## 2015-11-01 ENCOUNTER — Other Ambulatory Visit (HOSPITAL_COMMUNITY): Payer: Medicare Other

## 2015-11-01 ENCOUNTER — Inpatient Hospital Stay (HOSPITAL_COMMUNITY): Payer: Medicare Other

## 2015-11-01 ENCOUNTER — Emergency Department (HOSPITAL_COMMUNITY): Payer: Medicare Other

## 2015-11-01 ENCOUNTER — Ambulatory Visit (HOSPITAL_COMMUNITY): Payer: Medicare Other

## 2015-11-01 ENCOUNTER — Encounter (HOSPITAL_COMMUNITY): Payer: Self-pay | Admitting: Emergency Medicine

## 2015-11-01 ENCOUNTER — Inpatient Hospital Stay (HOSPITAL_COMMUNITY)
Admission: EM | Admit: 2015-11-01 | Discharge: 2015-11-07 | DRG: 378 | Disposition: A | Discharge status: Skilled Nursing Facility | Payer: Medicare Other | Attending: Internal Medicine | Admitting: Internal Medicine

## 2015-11-01 DIAGNOSIS — D62 Acute posthemorrhagic anemia: Secondary | ICD-10-CM | POA: Diagnosis not present

## 2015-11-01 DIAGNOSIS — R41841 Cognitive communication deficit: Secondary | ICD-10-CM | POA: Diagnosis not present

## 2015-11-01 DIAGNOSIS — I4891 Unspecified atrial fibrillation: Secondary | ICD-10-CM | POA: Diagnosis not present

## 2015-11-01 DIAGNOSIS — I69311 Memory deficit following cerebral infarction: Secondary | ICD-10-CM | POA: Diagnosis not present

## 2015-11-01 DIAGNOSIS — Z9181 History of falling: Secondary | ICD-10-CM

## 2015-11-01 DIAGNOSIS — E890 Postprocedural hypothyroidism: Secondary | ICD-10-CM | POA: Diagnosis present

## 2015-11-01 DIAGNOSIS — M25512 Pain in left shoulder: Secondary | ICD-10-CM | POA: Diagnosis not present

## 2015-11-01 DIAGNOSIS — Z85850 Personal history of malignant neoplasm of thyroid: Secondary | ICD-10-CM | POA: Diagnosis not present

## 2015-11-01 DIAGNOSIS — R5381 Other malaise: Secondary | ICD-10-CM | POA: Diagnosis present

## 2015-11-01 DIAGNOSIS — W19XXXD Unspecified fall, subsequent encounter: Secondary | ICD-10-CM | POA: Diagnosis not present

## 2015-11-01 DIAGNOSIS — K921 Melena: Secondary | ICD-10-CM | POA: Diagnosis not present

## 2015-11-01 DIAGNOSIS — R29818 Other symptoms and signs involving the nervous system: Secondary | ICD-10-CM | POA: Diagnosis not present

## 2015-11-01 DIAGNOSIS — W19XXXA Unspecified fall, initial encounter: Secondary | ICD-10-CM

## 2015-11-01 DIAGNOSIS — Z806 Family history of leukemia: Secondary | ICD-10-CM | POA: Diagnosis not present

## 2015-11-01 DIAGNOSIS — Z87891 Personal history of nicotine dependence: Secondary | ICD-10-CM | POA: Diagnosis not present

## 2015-11-01 DIAGNOSIS — E785 Hyperlipidemia, unspecified: Secondary | ICD-10-CM | POA: Diagnosis present

## 2015-11-01 DIAGNOSIS — M79672 Pain in left foot: Secondary | ICD-10-CM | POA: Diagnosis not present

## 2015-11-01 DIAGNOSIS — E861 Hypovolemia: Secondary | ICD-10-CM | POA: Diagnosis present

## 2015-11-01 DIAGNOSIS — R29898 Other symptoms and signs involving the musculoskeletal system: Secondary | ICD-10-CM | POA: Diagnosis not present

## 2015-11-01 DIAGNOSIS — I9589 Other hypotension: Secondary | ICD-10-CM | POA: Diagnosis not present

## 2015-11-01 DIAGNOSIS — M25561 Pain in right knee: Secondary | ICD-10-CM | POA: Diagnosis not present

## 2015-11-01 DIAGNOSIS — I69351 Hemiplegia and hemiparesis following cerebral infarction affecting right dominant side: Secondary | ICD-10-CM

## 2015-11-01 DIAGNOSIS — Z8673 Personal history of transient ischemic attack (TIA), and cerebral infarction without residual deficits: Secondary | ICD-10-CM

## 2015-11-01 DIAGNOSIS — K922 Gastrointestinal hemorrhage, unspecified: Secondary | ICD-10-CM | POA: Diagnosis present

## 2015-11-01 DIAGNOSIS — R627 Adult failure to thrive: Secondary | ICD-10-CM | POA: Diagnosis not present

## 2015-11-01 DIAGNOSIS — K625 Hemorrhage of anus and rectum: Principal | ICD-10-CM | POA: Diagnosis present

## 2015-11-01 DIAGNOSIS — B9620 Unspecified Escherichia coli [E. coli] as the cause of diseases classified elsewhere: Secondary | ICD-10-CM | POA: Diagnosis not present

## 2015-11-01 DIAGNOSIS — E039 Hypothyroidism, unspecified: Secondary | ICD-10-CM | POA: Diagnosis not present

## 2015-11-01 DIAGNOSIS — I959 Hypotension, unspecified: Secondary | ICD-10-CM

## 2015-11-01 DIAGNOSIS — R1032 Left lower quadrant pain: Secondary | ICD-10-CM | POA: Diagnosis not present

## 2015-11-01 DIAGNOSIS — D509 Iron deficiency anemia, unspecified: Secondary | ICD-10-CM | POA: Diagnosis not present

## 2015-11-01 DIAGNOSIS — K5730 Diverticulosis of large intestine without perforation or abscess without bleeding: Secondary | ICD-10-CM | POA: Diagnosis present

## 2015-11-01 DIAGNOSIS — I481 Persistent atrial fibrillation: Secondary | ICD-10-CM | POA: Diagnosis not present

## 2015-11-01 DIAGNOSIS — E876 Hypokalemia: Secondary | ICD-10-CM | POA: Diagnosis not present

## 2015-11-01 DIAGNOSIS — M7732 Calcaneal spur, left foot: Secondary | ICD-10-CM | POA: Diagnosis not present

## 2015-11-01 DIAGNOSIS — T148 Other injury of unspecified body region: Secondary | ICD-10-CM | POA: Diagnosis not present

## 2015-11-01 DIAGNOSIS — R1311 Dysphagia, oral phase: Secondary | ICD-10-CM | POA: Diagnosis not present

## 2015-11-01 DIAGNOSIS — I480 Paroxysmal atrial fibrillation: Secondary | ICD-10-CM | POA: Diagnosis not present

## 2015-11-01 DIAGNOSIS — N390 Urinary tract infection, site not specified: Secondary | ICD-10-CM | POA: Diagnosis not present

## 2015-11-01 DIAGNOSIS — I10 Essential (primary) hypertension: Secondary | ICD-10-CM | POA: Diagnosis present

## 2015-11-01 DIAGNOSIS — R51 Headache: Secondary | ICD-10-CM | POA: Diagnosis not present

## 2015-11-01 DIAGNOSIS — M6281 Muscle weakness (generalized): Secondary | ICD-10-CM | POA: Diagnosis not present

## 2015-11-01 LAB — CBC WITH DIFFERENTIAL/PLATELET
Basophils Absolute: 0 10*3/uL (ref 0.0–0.1)
Basophils Relative: 1 %
EOS ABS: 0.1 10*3/uL (ref 0.0–0.7)
Eosinophils Relative: 1 %
HCT: 33.4 % — ABNORMAL LOW (ref 36.0–46.0)
HEMOGLOBIN: 11.5 g/dL — AB (ref 12.0–15.0)
LYMPHS ABS: 1.4 10*3/uL (ref 0.7–4.0)
Lymphocytes Relative: 16 %
MCH: 32.1 pg (ref 26.0–34.0)
MCHC: 34.4 g/dL (ref 30.0–36.0)
MCV: 93.3 fL (ref 78.0–100.0)
Monocytes Absolute: 0.5 10*3/uL (ref 0.1–1.0)
Monocytes Relative: 6 %
NEUTROS PCT: 76 %
Neutro Abs: 6.7 10*3/uL (ref 1.7–7.7)
Platelets: 188 10*3/uL (ref 150–400)
RBC: 3.58 MIL/uL — AB (ref 3.87–5.11)
RDW: 13.5 % (ref 11.5–15.5)
WBC: 8.7 10*3/uL (ref 4.0–10.5)

## 2015-11-01 LAB — MAGNESIUM: MAGNESIUM: 1.7 mg/dL (ref 1.7–2.4)

## 2015-11-01 LAB — CBC
HCT: 33.9 % — ABNORMAL LOW (ref 36.0–46.0)
HCT: 33.7 % — ABNORMAL LOW (ref 36.0–46.0)
HEMOGLOBIN: 11.5 g/dL — AB (ref 12.0–15.0)
Hemoglobin: 11.6 g/dL — ABNORMAL LOW (ref 12.0–15.0)
MCH: 31.7 pg (ref 26.0–34.0)
MCH: 31.1 pg (ref 26.0–34.0)
MCHC: 34.2 g/dL (ref 30.0–36.0)
MCHC: 34.1 g/dL (ref 30.0–36.0)
MCV: 92.6 fL (ref 78.0–100.0)
MCV: 91.1 fL (ref 78.0–100.0)
PLATELETS: 171 10*3/uL (ref 150–400)
PLATELETS: 181 10*3/uL (ref 150–400)
RBC: 3.66 MIL/uL — ABNORMAL LOW (ref 3.87–5.11)
RBC: 3.7 MIL/uL — AB (ref 3.87–5.11)
RDW: 13.9 % (ref 11.5–15.5)
RDW: 14.3 % (ref 11.5–15.5)
WBC: 11 10*3/uL — AB (ref 4.0–10.5)
WBC: 10.9 10*3/uL — ABNORMAL HIGH (ref 4.0–10.5)

## 2015-11-01 LAB — TROPONIN I: Troponin I: <0.03 ng/mL (ref <0.031)

## 2015-11-01 LAB — COMPREHENSIVE METABOLIC PANEL
ALBUMIN: 2.9 g/dL — AB (ref 3.5–5.0)
ALK PHOS: 72 U/L (ref 38–126)
ALT: 9 U/L — AB (ref 14–54)
AST: 17 U/L (ref 15–41)
Anion gap: 11 (ref 5–15)
BUN: 27 mg/dL — AB (ref 6–20)
CALCIUM: 9.3 mg/dL (ref 8.9–10.3)
CO2: 26 mmol/L (ref 22–32)
CREATININE: 0.95 mg/dL (ref 0.44–1.00)
Chloride: 106 mmol/L (ref 101–111)
GFR calc non Af Amer: 53 mL/min — ABNORMAL LOW (ref >60)
GLUCOSE: 122 mg/dL — AB (ref 65–99)
Potassium: 3.9 mmol/L (ref 3.5–5.1)
SODIUM: 143 mmol/L (ref 135–145)
Total Bilirubin: 0.4 mg/dL (ref 0.3–1.2)
Total Protein: 5.4 g/dL — ABNORMAL LOW (ref 6.5–8.1)

## 2015-11-01 LAB — T4, FREE: FREE T4: 1.41 ng/dL — AB (ref 0.61–1.12)

## 2015-11-01 LAB — TSH: TSH: 0.03 u[IU]/mL — AB (ref 0.350–4.500)

## 2015-11-01 LAB — I-STAT CG4 LACTIC ACID, ED: LACTIC ACID, VENOUS: 1.51 mmol/L (ref 0.5–2.0)

## 2015-11-01 LAB — MRSA PCR SCREENING: MRSA BY PCR: NEGATIVE

## 2015-11-01 LAB — POC OCCULT BLOOD, ED: Fecal Occult Bld: POSITIVE — AB

## 2015-11-01 LAB — HEMOGLOBIN AND HEMATOCRIT, BLOOD
HEMATOCRIT: 31.3 % — AB (ref 36.0–46.0)
Hemoglobin: 10.5 g/dL — ABNORMAL LOW (ref 12.0–15.0)

## 2015-11-01 LAB — GLUCOSE, CAPILLARY: GLUCOSE-CAPILLARY: 122 mg/dL — AB (ref 65–99)

## 2015-11-01 LAB — ABO/RH: ABO/RH(D): B POS

## 2015-11-01 LAB — PREPARE RBC (CROSSMATCH)

## 2015-11-01 MED ORDER — ACETAMINOPHEN 650 MG RE SUPP: 650.0000 mg | Freq: Four times a day (QID) | RECTAL | Status: DC | PRN

## 2015-11-01 MED ORDER — ONDANSETRON HCL 4 MG/2ML IJ SOLN: 4.0000 mg | Freq: Four times a day (QID) | INTRAMUSCULAR | Status: DC | PRN

## 2015-11-01 MED ORDER — SODIUM CHLORIDE 0.9 % IV SOLN: INTRAVENOUS | Status: DC

## 2015-11-01 MED ORDER — PANTOPRAZOLE SODIUM 40 MG IV SOLR: 40.0000 mg | Freq: Two times a day (BID) | INTRAVENOUS | Status: DC

## 2015-11-01 MED ORDER — SODIUM CHLORIDE 0.9 % IV BOLUS (SEPSIS)
500.0000 mL | Freq: Once | INTRAVENOUS | Status: AC
  Administered 2015-11-01: 500 mL via INTRAVENOUS

## 2015-11-01 MED ORDER — SODIUM CHLORIDE 0.9 % IJ SOLN
3.0000 mL | Freq: Two times a day (BID) | INTRAMUSCULAR | Status: DC
  Administered 2015-11-01 – 2015-11-06 (×10): 3 mL via INTRAVENOUS

## 2015-11-01 MED ORDER — SODIUM CHLORIDE 0.9 % IV SOLN
Freq: Once | INTRAVENOUS | Status: AC
  Administered 2015-11-01: 12:00:00 via INTRAVENOUS

## 2015-11-01 MED ORDER — ONDANSETRON HCL 4 MG PO TABS: 4.0000 mg | ORAL_TABLET | Freq: Four times a day (QID) | ORAL | Status: DC | PRN

## 2015-11-01 MED ORDER — SODIUM CHLORIDE 0.9 % IV SOLN
8.0000 mg/h | INTRAVENOUS | Status: DC
  Administered 2015-11-01 – 2015-11-02 (×2): 8 mg/h via INTRAVENOUS
  Filled 2015-11-01 (×5): qty 80

## 2015-11-01 MED ORDER — IOHEXOL 300 MG/ML  SOLN
100.0000 mL | Freq: Once | INTRAMUSCULAR | Status: AC | PRN
  Administered 2015-11-01: 100 mL via INTRAVENOUS

## 2015-11-01 MED ORDER — ACETAMINOPHEN 325 MG PO TABS
650.0000 mg | ORAL_TABLET | Freq: Four times a day (QID) | ORAL | Status: DC | PRN
  Administered 2015-11-01 – 2015-11-04 (×2): 650 mg via ORAL
  Filled 2015-11-01 (×3): qty 2

## 2015-11-01 MED ORDER — IOHEXOL 300 MG/ML  SOLN
25.0000 mL | Freq: Once | INTRAMUSCULAR | Status: AC | PRN
  Administered 2015-11-01: 25 mL via ORAL

## 2015-11-01 MED ORDER — SODIUM CHLORIDE 0.9 % IV SOLN
Freq: Once | INTRAVENOUS | Status: AC
  Administered 2015-11-01: 09:00:00 via INTRAVENOUS

## 2015-11-01 MED ORDER — NYSTATIN 100000 UNIT/GM EX POWD
Freq: Three times a day (TID) | CUTANEOUS | Status: DC
  Administered 2015-11-02 (×2): via TOPICAL
  Administered 2015-11-02: 1 g via TOPICAL
  Administered 2015-11-02 – 2015-11-07 (×10): via TOPICAL
  Filled 2015-11-01 (×2): qty 15

## 2015-11-01 MED ORDER — PANTOPRAZOLE SODIUM 40 MG IV SOLR
80.0000 mg | Freq: Once | INTRAVENOUS | Status: AC
  Administered 2015-11-01: 80 mg via INTRAVENOUS
  Filled 2015-11-01: qty 80

## 2015-11-01 NOTE — ED Notes (Signed)
Patient reports that she has been experiencing blood in stool since yesterday, but denies blood in urine.

## 2015-11-01 NOTE — ED Notes (Signed)
Dr. Merrell MD at bedside. 

## 2015-11-01 NOTE — ED Notes (Signed)
Dr. Betsey Holiday made aware of BP 88/61.  New order received for 58mL NS bolus.

## 2015-11-01 NOTE — Progress Notes (Signed)
Patient requested to use the bathroom. Patient informs writer that she is unable to use the bedpan stating, "nothing happens when I get on there. They have been letting me use the bathroom all day". Patient was educated on her current v/s and the risk of her blood pressure lowering and her rate increasing. Patient Son and Daughter are at the bedside and adamantly   Requesting she be allowed to at least try the bed bedside commode. When patient sat on the side of the bed her heart rate immediately increased to 140's, 150's and 160's. Patient was educated on the dangers and risk of her rate elevating as high as it was especially in her current condition. Patient then agreed to try and use bed pan. No stool noted. Baltazar Najjar NP phoned and updated. Will continue to monitor.

## 2015-11-01 NOTE — ED Notes (Signed)
Dr. Marily Memos made aware of BP 83/44.  Per Dr. Marily Memos continue to monitor pt.  Pt AOx4, denies pain, NAD noted at this time.

## 2015-11-01 NOTE — ED Notes (Signed)
Dr. Reather Converse made aware of output.

## 2015-11-01 NOTE — Progress Notes (Addendum)
NP to reassess pt to see if still appropriate for SDU. Discussed case prior to assessment with Dr. Marily Memos, [facility] admitting MD. Dr. Marily Memos reviewed chart/VS and thought pt was safe for SDU as her BP and HR were now improved.  NP to bedside. S: Pt says she feels better than when she arrived in ED. Denies CP, palpitations or dizziness. Family states last BM was around 3pm and RN concurs. Per RN, BPs have been 110-120s for past hour.  O: Frail, elderly WF in NAD, lying in bed. Non toxic appearing. Last temp 98.87F. BP 110/70. HR 100 Afib. RR 18. SaO2 97% on RA. She is alert. Card: irreg irreg rhythm, rate 95-100. Tele with Afib. Lungs: CTA with normal respiratory effort. Skin: pink, warm and dry. Neuro: non focal.  A/P: 1. GIB with hypotension on admission-BP has stabilized. No BM in 5 hours. Going to CT now. Asked RN to go with pt to CT and monitor BP/HR. RN to paged this NP should BP lower or HR > 120. RN also to page me vitals after CT is finished. Holding Plavix/ASA. q6hr CBCs or more often if rebleeds. Last Hgb 11.9.  2. New Afib-rate controlled. Cards has seen.  3. Remote CVA-plavix and asa on hold.  4. Hypotension on admit-improved. Holding antihypertensives.  RN, Herbert Spires, paged this NP with vital signs after CT finished. BP 106-120. HR 102.  Pt is stable for SDU bed. This NP called PCCM and discussed case and they agree pt is not an ICU candidate at this time. This information was relayed to Dr. Collene Mares of GI as well. Family updated. Will monitor closely.  Clance Boll, NP [facility] Update: CTA results back. Dr. Laurence Ferrari of IR paged. Discussed results. Per him, no bleed is seen at this time. Should pt rebleed or become unstable, we will get a RBC tag study and repage Dr. Laurence Ferrari. Pt is still free of bloody BM since around 3pm today.  KJKG, NP [facility] Update: Pt continues to be BM free. BP and HR have remained stable. O2 sat 97% on RA. Next Hgb due soon. KJKG, NP [facility] Update: BP 1 teens. HR <  100. No rectal bleeding or BMs this shift. Appears that lab didn't draw last CBC so ordered stat and is now in process. Reported to oncoming attending. KJKG, NP [facility]

## 2015-11-01 NOTE — Consult Note (Addendum)
UNASSIGNED PATIENT Reason for Consult: Rectal bleeding with hypotension. Referring Physician: ER MD  Jillian Cantrell is an 82 y.o. female.  HPI: Jillian Cantrell is a 82 year old white female, who was in her usual state of health till about 2 PM yesterday, when she started noticing bright red bleeding per rectum. Patient felt weak and dizzy and contacted her daughter and together they decided to see her PCP this morning. However, at about 3:30 this morning, after multiple episodes of rectal bleeding, sustained a fall thought to be a syncopal event and pressed her medical alert alarm to get the EMS to help her. She was hypotensive with a blood pressure of 80/40 when she was brought to the emergency room. She received 2 units of packed red blood cells and blood pressure has improved to 110/70. She's had some left lower quadrant crampy abdominal pain. She denies having any nausea vomiting fever chills dysphagia or odynophagia. She denies having any such symptoms in the past. She remotely remembers having a colonoscopy about 10 years ago but cannot remember who did her procedure. She also does not remember the findings of the colonoscopy. She's been on Plavix and Aspirin since her stroke in 2003 and also takes Celebrex for arthritis. She also has atrial fibrillation.Her husband continues to take care of her medications until he died in 04-Aug-2015. One of her daughters is trying to take over her medical care. She's had a headache since her fall but there is no injuries noted to her head   Past Medical History  Diagnosis Date  . Stroke ([place])   . Hypertension   . Thyroid disease   . Cancer [facility])     thyroid cancer s/p thyroidectomy   Past Surgical History  Procedure Laterality Date  . Cholecystectomy    . Abdominal hysterectomy    . Appendectomy    . Varicose vein surgery    . Thyroidectomy     Family History  Problem Relation Age of Onset  . Cancer Father     leukemia   Social History:   reports that she quit smoking about 47 years ago. She does not have any smokeless tobacco history on file. She reports that she does not drink alcohol or use illicit drugs.  Allergies:  Allergies  Allergen Reactions  . Codeine Hives   Medications: I have reviewed the patient's current medications.  Results for orders placed or performed during the hospital encounter of 11/01/15 (from the past 48 hour(s))  POC occult blood, ED     Status: Abnormal   Collection Time: 11/01/15  6:19 AM  Result Value Ref Range   Fecal Occult Bld POSITIVE (A) NEGATIVE  CBC with Differential/Platelet     Status: Abnormal   Collection Time: 11/01/15  6:51 AM  Result Value Ref Range   WBC 8.7 4.0 - 10.5 K/uL   RBC 3.58 (L) 3.87 - 5.11 MIL/uL   Hemoglobin 11.5 (L) 12.0 - 15.0 g/dL   HCT 33.4 (L) 36.0 - 46.0 %   MCV 93.3 78.0 - 100.0 fL   MCH 32.1 26.0 - 34.0 pg   MCHC 34.4 30.0 - 36.0 g/dL   RDW 13.5 11.5 - 15.5 %   Platelets 188 150 - 400 K/uL   Neutrophils Relative % 76 %   Neutro Abs 6.7 1.7 - 7.7 K/uL   Lymphocytes Relative 16 %   Lymphs Abs 1.4 0.7 - 4.0 K/uL   Monocytes Relative 6 %   Monocytes Absolute 0.5 0.1 -  1.0 K/uL   Eosinophils Relative 1 %   Eosinophils Absolute 0.1 0.0 - 0.7 K/uL   Basophils Relative 1 %   Basophils Absolute 0.0 0.0 - 0.1 K/uL  Comprehensive metabolic panel     Status: Abnormal   Collection Time: 11/01/15  6:51 AM  Result Value Ref Range   Sodium 143 135 - 145 mmol/L   Potassium 3.9 3.5 - 5.1 mmol/L   Chloride 106 101 - 111 mmol/L   CO2 26 22 - 32 mmol/L   Glucose, Bld 122 (H) 65 - 99 mg/dL   BUN 27 (H) 6 - 20 mg/dL   Creatinine, Ser 0.95 0.44 - 1.00 mg/dL   Calcium 9.3 8.9 - 10.3 mg/dL   Total Protein 5.4 (L) 6.5 - 8.1 g/dL   Albumin 2.9 (L) 3.5 - 5.0 g/dL   AST 17 15 - 41 U/L   ALT 9 (L) 14 - 54 U/L   Alkaline Phosphatase 72 38 - 126 U/L   Total Bilirubin 0.4 0.3 - 1.2 mg/dL   GFR calc non Af Amer 53 (L) >60 mL/min   GFR calc Af Amer >60 >60 mL/min     Comment: (NOTE) The eGFR has been calculated using the CKD EPI equation. This calculation has not been validated in all clinical situations. eGFR's persistently <60 mL/min signify possible Chronic Kidney Disease.    Anion gap 11 5 - 15  Type and screen     Status: None (Preliminary result)   Collection Time: 11/01/15  6:51 AM  Result Value Ref Range   ABO/RH(D) B POS    Antibody Screen NEG    Sample Expiration 11/04/2015    Unit Number [id number]    Blood Component Type RED CELLS,LR    Unit division 00    Status of Unit ISSUED    Transfusion Status OK TO TRANSFUSE    Crossmatch Result Compatible    Unit Number [id number]    Blood Component Type RED CELLS,LR    Unit division 00    Status of Unit ISSUED    Transfusion Status OK TO TRANSFUSE    Crossmatch Result Compatible   Troponin I     Status: None   Collection Time: 11/01/15  6:51 AM  Result Value Ref Range   Troponin I <0.03 <0.031 ng/mL    Comment:        NO INDICATION OF MYOCARDIAL INJURY.   ABO/Rh     Status: None   Collection Time: 11/01/15  6:51 AM  Result Value Ref Range   ABO/RH(D) B POS   I-Stat CG4 Lactic Acid, ED     Status: None   Collection Time: 11/01/15  9:30 AM  Result Value Ref Range   Lactic Acid, Venous 1.51 0.5 - 2.0 mmol/L  Prepare RBC     Status: None   Collection Time: 11/01/15 10:09 AM  Result Value Ref Range   Order Confirmation ORDER PROCESSED BY BLOOD BANK   Hemoglobin and hematocrit, blood     Status: Abnormal   Collection Time: 11/01/15 10:15 AM  Result Value Ref Range   Hemoglobin 10.5 (L) 12.0 - 15.0 g/dL   HCT 31.3 (L) 36.0 - 46.0 %  TSH     Status: Abnormal   Collection Time: 11/01/15 10:15 AM  Result Value Ref Range   TSH 0.030 (L) 0.350 - 4.500 uIU/mL  Magnesium     Status: None   Collection Time: 11/01/15 10:15 AM  Result Value Ref Range   Magnesium 1.7 1.7 -  2.4 mg/dL  T4, free     Status: Abnormal   Collection Time: 11/01/15 10:15 AM  Result Value Ref Range    Free T4 1.41 (H) 0.61 - 1.12 ng/dL   Ct Head Wo Contrast  11/01/2015  CLINICAL DATA:  Pain following fall EXAM: CT HEAD WITHOUT CONTRAST TECHNIQUE: Contiguous axial images were obtained from the base of the skull through the vertex without intravenous contrast. COMPARISON:  Brain MRI June 05, 2005 FINDINGS: Moderate diffuse atrophy is stable. There is a stable superior right frontal calcified meningioma without surrounding edema measuring 1.3 x 1.2 cm. There is a stable high left frontal calcified meningioma measuring 1.5 x 1.5 cm without surrounding edema. There is no new mass. There is no hemorrhage, extra-axial fluid collection, or midline shift. 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 This infarct extends to the posterior inferior left centrum semiovale, stable. There is patchy small vessel disease in the centra semiovale bilaterally, stable. There is evidence of prior infarct in the superomedial left basal ganglia region. No acute infarct evident. Bony calvarium appears intact. The mastoid air cells are clear. No intraorbital lesions. There is a retention cyst in the anterior left sphenoid sinus region. IMPRESSION: Stable bilateral vertex meningiomas, calcified, without surrounding edema. There is atrophy with prior infarct at the left frontal-parietal junction. There is small vessel disease in the centra semiovale bilaterally as well as prior infarct in the medial left basal ganglia region. No acute infarct evident. No hemorrhage. No edema. Small retention cyst left sphenoid sinus region. Electronically Signed   By: Lowella Grip III M.D.   On: 11/01/2015 09:35   Ct Abdomen Pelvis W Contrast  11/01/2015  CLINICAL DATA:  Fall this morning. Left lower quadrant pain. Rectal bleeding. Initial encounter. EXAM: CT ABDOMEN AND PELVIS WITH CONTRAST TECHNIQUE: Multidetector CT imaging of the abdomen and pelvis was performed using the standard protocol following  bolus administration of intravenous contrast. CONTRAST:  178m OMNIPAQUE IOHEXOL 300 MG/ML  SOLN COMPARISON:  01/07/2015 FINDINGS: Lower chest:  No acute findings. Hepatobiliary: No liver masses identified. Gallbladder is surgically absent. Mild dilatation of extrahepatic common bile duct is stable and likely related to prior cholecystectomy. Pancreas: No mass, inflammatory changes, or other significant abnormality. Spleen: No evidence of splenomegaly. Multiple low-attenuation lesions throughout the spleen show no significant change, with index lesion in the posterior splenic dome measuring 2.2 cm on image 19/series 201. Adrenals/Urinary Tract: No masses identified. No evidence of hydronephrosis. Stomach/Bowel: No evidence of obstruction, inflammatory process, or abnormal fluid collections. Severe diverticulosis again seen involving the descending and sigmoid colon, however there is no evidence of diverticulitis. Vascular/Lymphatic: No pathologically enlarged lymph nodes. No evidence of abdominal aortic aneurysm. Aortic atherosclerotic plaque noted. Reproductive: Prior hysterectomy noted. Adnexal regions are unremarkable in appearance. Other: No evidence of abdominal parenchymal organ injury or hemoperitoneum. Musculoskeletal: No suspicious bone lesions identified. No fractures identified. Advanced lumbar spine degenerative changes noted. IMPRESSION: No evidence of visceral injury, hemoperitoneum, or other acute findings. Stable multiple nonspecific low-attenuation splenic lesions, most likely benign in etiology. No evidence of splenomegaly or lymphadenopathy. Colonic diverticulosis. No radiographic evidence of diverticulitis. Electronically Signed   By: JEarle GellM.D.   On: 11/01/2015 09:42   Dg Knee Complete 4 Views Right  11/01/2015  CLINICAL DATA:  Pain following fall EXAM: RIGHT KNEE - COMPLETE 4+ VIEW COMPARISON:  None. FINDINGS: Frontal, lateral, and bilateral oblique views were obtained. There is no  demonstrable fracture or  dislocation. There is a small joint effusion. There is moderate narrowing of the patellofemoral joint. There is spurring in all compartments. No erosive change. IMPRESSION: Small joint effusion. 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 No fracture or dislocation. Electronically Signed   By: Lowella Grip III M.D.   On: 11/01/2015 08:32   Dg Foot Complete Left  11/01/2015  CLINICAL DATA:  Pain following fall EXAM: LEFT FOOT - COMPLETE 3+ VIEW COMPARISON:  None. FINDINGS: Frontal, oblique, and lateral views obtained. There is no apparent fracture or dislocation. There is narrowing of all MTP, PIP, and DIP joints. There is a cystic area in the lateral proximal fourth metatarsal which appears benign. There is spurring in the dorsal midfoot. There are posterior and inferior calcaneal spurs. There is mild pes cavus. IMPRESSION: Multilevel osteoarthritic change. Calcaneal spurs. Benign appearing cystic area in the lateral proximal fourth metatarsal. No acute fracture or dislocation. Mild pes cavus. Electronically Signed   By: Lowella Grip III M.D.   On: 11/01/2015 08:35   Review of Systems  Constitutional: Positive for malaise/fatigue and diaphoresis. Negative for fever, chills and weight loss.  HENT: Negative for congestion, ear discharge, ear pain, hearing loss, nosebleeds, sore throat and tinnitus.   Eyes: Negative.   Respiratory: Negative.   Cardiovascular: Negative.   Gastrointestinal: Positive for abdominal pain and blood in stool. Negative for heartburn, nausea, vomiting, diarrhea and constipation.  Genitourinary: Negative.   Musculoskeletal: Positive for joint pain.  Skin: Negative.   Neurological: Positive for dizziness, weakness and headaches.  Endo/Heme/Allergies: Bruises/bleeds easily.  Psychiatric/Behavioral: The patient is nervous/anxious.    Blood pressure 102/66, pulse 98, temperature 98.5 F (36.9 C), temperature source Oral, resp.  rate 20, height _0  (1.727 m), weight 74.844 kg (165 lb), SpO2 96 %. Physical Exam  Constitutional: She is oriented to person, place, and time. She appears well-developed and well-nourished.  HENT:  Head: Normocephalic and atraumatic.  Eyes: EOM are normal.  Neck: Normal range of motion. Neck supple.  Cardiovascular: Normal rate and regular rhythm.   Respiratory: Effort normal and breath sounds normal.  GI: Soft. Bowel sounds are normal. She exhibits no distension and no mass. There is tenderness. There is no rebound and no guarding.  Neurological: She is oriented to person, place, and time.  Skin: Skin is warm and dry.  Psychiatric: She has a normal mood and affect. Her behavior is normal. Judgment and thought content normal.   Assessment/Plan: 1) Massive GI hemorrhage-rectal bleeding severe hyportension and anemia resulting in a syncopal event; colonic diverticulosis on CT-Patient has had over 2500 mL of bleeding while in the ER. As per my discussion with Dr. Jacqulynn Cadet in interventional radiology plans are to proceed with a CTA first followed by an arteriogram to localize the source of bleeding and possibly plugged the site of bleeding. This has been discussed with the patient's family in great detail who are at her bedside. We will continue to monitor her symptoms serial CBCs and transfuse as needed. As per my discussion with the family medicine teaching service Dr. Payton Emerald, plans are to transfer to the ICU and get this ICU team involved in her care as well. All anticoagulants to be held for now.  2) History of cerebrovascular accident in the past/Atrial fibrillation-on Plavix and aspirin. 3) Hyperlipidemia/Hypertension.  4) Memory loss s/p stroke in 2003.,   Maritta Kief 11/01/2015, 4:15 PM

## 2015-11-01 NOTE — ED Notes (Signed)
Called CT, stated they will get her in before going upstairs

## 2015-11-01 NOTE — ED Provider Notes (Signed)
Patient's care signed out to follow-up CT scan and reassess. Patient is doing fairly well in the ER until she had a very large episode of dark bloody stool. Patient's heart rate increased, blood pressure borderline. Discussed with hospitalist regarding stepdown versus ICU. Plan to start with stepdown and less patient clinically worsens. Plan for GI consult once patient stable. 2 units of blood ordered discussed risk and benefits with the patient. Patient pale, general weak on exam, dry mucous membranes, no significant abdominal pain. AO. Spoke with GI consult.  Filed Vitals:   11/01/15 1200 11/01/15 1215  BP: 103/53 109/65  Pulse: 101 96  Temp:    Resp: 19 14    Labs Reviewed  CBC WITH DIFFERENTIAL/PLATELET - Abnormal; Notable for the following:    RBC 3.58 (*)    Hemoglobin 11.5 (*)    HCT 33.4 (*)    All other components within normal limits  COMPREHENSIVE METABOLIC PANEL - Abnormal; Notable for the following:    Glucose, Bld 122 (*)    BUN 27 (*)    Total Protein 5.4 (*)    Albumin 2.9 (*)    ALT 9 (*)    GFR calc non Af Amer 53 (*)    All other components within normal limits  POC OCCULT BLOOD, ED - Abnormal; Notable for the following:    Fecal Occult Bld POSITIVE (*)    All other components within normal limits  TROPONIN I  HEMOGLOBIN AND HEMATOCRIT, BLOOD  I-STAT CG4 LACTIC ACID, ED  TYPE AND SCREEN  ABO/RH  PREPARE RBC (CROSSMATCH)    CRITICAL CARE Performed by: Mariea Clonts   Total critical care time: 35 minutes  Critical care time was exclusive of separately billable procedures and treating other patients.  Critical care was necessary to treat or prevent imminent or life-threatening deterioration.  Critical care was time spent personally by me on the following activities: development of treatment plan with patient and/or surrogate as well as nursing, discussions with consultants, evaluation of patient's response to treatment, examination of patient,  obtaining history from patient or surrogate, ordering and performing treatments and interventions, ordering and review of laboratory studies, ordering and review of radiographic studies, pulse oximetry and re-evaluation of patient's condition.  GI bleed   Elnora Morrison, MD 11/01/15 1228

## 2015-11-01 NOTE — ED Notes (Signed)
Patient transported to CT 

## 2015-11-01 NOTE — Consult Note (Signed)
Reason for Consult: NEW a fib with acute GI bleed.    Referring Physician: Dr.  Marily Memos  PCP:  Geoffery Lyons, MD  Primary Cardiologist:NEW  Jillian Cantrell is an 82 y.o. female.    Chief Complaint: admitted after presented with fall today   HPI:  82 year old female, with hx of stroke, HTN and thyroid cancer- we have been asked to see for new a fib, rate is controlled.  Pt had been in usual state of health until yesterday and began with bloody bowel movements.  When she fell she was weak all over.  She states she did not pass out.  Her family found her on her back.  She denies any chest pain today or any day.  No history of atrial fib.  She lives alone but has help with ADLs and someone else does housekeeping.   Currently with aches in rt groin ? From fall.    H/H 11.5/33.4, troponin <0.03 CT of head and abd without acute process. Stools Heme+ ; knee and foot xrays without fracture.  EKG a fib rate controlled, old ant MI with Q waves in V1-2,  No acute process EKG from Dr. Jacquiline Doe office from 06/12/12 with Glade Stanford at 59-Q waves in ant leads present then.  Past Medical History  Diagnosis Date  . Stroke ([place])   . Hypertension   . Thyroid disease   . Cancer [facility])     thyroid cancer s/p thyroidectomy    Past Surgical History  Procedure Laterality Date  . Cholecystectomy    . Abdominal hysterectomy    . Appendectomy    . Varicose vein surgery    . Thyroidectomy      Family History  Problem Relation Age of Onset  . Cancer Father     leukemia   Social History:  reports that she quit smoking about 47 years ago. She does not have any smokeless tobacco history on file. She reports that she does not drink alcohol or use illicit drugs.  Allergies:  Allergies  Allergen Reactions  . Codeine Hives    OUTPATIENT MEDICATIONS: No current facility-administered medications on file prior to encounter.   Current Outpatient Prescriptions on File Prior to Encounter    Medication Sig Dispense Refill  . aspirin 81 MG EC tablet Take 81 mg by mouth daily. Swallow whole.    . bisoprolol-hydrochlorothiazide (ZIAC) 2.5-6.25 MG per tablet Take 1 tablet by mouth daily.    . celecoxib (CELEBREX) 200 MG capsule Take 200 mg by mouth daily.    . clopidogrel (PLAVIX) 75 MG tablet Take 75 mg by mouth daily.    . enalapril (VASOTEC) 5 MG tablet Take 5 mg by mouth daily.    . furosemide (LASIX) 40 MG tablet Take 40 mg by mouth as directed. Take 1 tablet daily for 3 days with potassium, then off for 3 days, then repeat with potassium    . levothyroxine (SYNTHROID, LEVOTHROID) 175 MCG tablet Take 175 mcg by mouth daily before breakfast.    . potassium chloride SA (K-DUR,KLOR-CON) 20 MEQ tablet Take 40 mEq by mouth as directed. Take 1 tablets daily for 3 days, then off for 3 days, then repeat    . simvastatin (ZOCOR) 20 MG tablet Take 20 mg by mouth daily.    . traMADol (ULTRAM) 50 MG tablet Take 1 tablet (50 mg total) by mouth every 6 (six) hours as needed. (Patient not taking: Reported on 11/01/2015) 12  tablet 0     Results for orders placed or performed during the hospital encounter of 11/01/15 (from the past 48 hour(s))  POC occult blood, ED     Status: Abnormal   Collection Time: 11/01/15  6:19 AM  Result Value Ref Range   Fecal Occult Bld POSITIVE (A) NEGATIVE  CBC with Differential/Platelet     Status: Abnormal   Collection Time: 11/01/15  6:51 AM  Result Value Ref Range   WBC 8.7 4.0 - 10.5 K/uL   RBC 3.58 (L) 3.87 - 5.11 MIL/uL   Hemoglobin 11.5 (L) 12.0 - 15.0 g/dL   HCT 33.4 (L) 36.0 - 46.0 %   MCV 93.3 78.0 - 100.0 fL   MCH 32.1 26.0 - 34.0 pg   MCHC 34.4 30.0 - 36.0 g/dL   RDW 13.5 11.5 - 15.5 %   Platelets 188 150 - 400 K/uL   Neutrophils Relative % 76 %   Neutro Abs 6.7 1.7 - 7.7 K/uL   Lymphocytes Relative 16 %   Lymphs Abs 1.4 0.7 - 4.0 K/uL   Monocytes Relative 6 %   Monocytes Absolute 0.5 0.1 - 1.0 K/uL   Eosinophils Relative 1 %    Eosinophils Absolute 0.1 0.0 - 0.7 K/uL   Basophils Relative 1 %   Basophils Absolute 0.0 0.0 - 0.1 K/uL  Comprehensive metabolic panel     Status: Abnormal   Collection Time: 11/01/15  6:51 AM  Result Value Ref Range   Sodium 143 135 - 145 mmol/L   Potassium 3.9 3.5 - 5.1 mmol/L   Chloride 106 101 - 111 mmol/L   CO2 26 22 - 32 mmol/L   Glucose, Bld 122 (H) 65 - 99 mg/dL   BUN 27 (H) 6 - 20 mg/dL   Creatinine, Ser 0.95 0.44 - 1.00 mg/dL   Calcium 9.3 8.9 - 10.3 mg/dL   Total Protein 5.4 (L) 6.5 - 8.1 g/dL   Albumin 2.9 (L) 3.5 - 5.0 g/dL   AST 17 15 - 41 U/L   ALT 9 (L) 14 - 54 U/L   Alkaline Phosphatase 72 38 - 126 U/L   Total Bilirubin 0.4 0.3 - 1.2 mg/dL   GFR calc non Af Amer 53 (L) >60 mL/min   GFR calc Af Amer >60 >60 mL/min    Comment: (NOTE) The eGFR has been calculated using the CKD EPI equation. This calculation has not been validated in all clinical situations. eGFR's persistently <60 mL/min signify possible Chronic Kidney Disease.    Anion gap 11 5 - 15  Type and screen     Status: None (Preliminary result)   Collection Time: 11/01/15  6:51 AM  Result Value Ref Range   ABO/RH(D) B POS    Antibody Screen NEG    Sample Expiration 11/04/2015    Unit Number [id number]    Blood Component Type RED CELLS,LR    Unit division 00    Status of Unit ISSUED    Transfusion Status OK TO TRANSFUSE    Crossmatch Result Compatible    Unit Number [id number]    Blood Component Type RED CELLS,LR    Unit division 00    Status of Unit ALLOCATED    Transfusion Status OK TO TRANSFUSE    Crossmatch Result Compatible   Troponin I     Status: None   Collection Time: 11/01/15  6:51 AM  Result Value Ref Range   Troponin I <0.03 <0.031 ng/mL    Comment:  NO INDICATION OF MYOCARDIAL INJURY.   ABO/Rh     Status: None   Collection Time: 11/01/15  6:51 AM  Result Value Ref Range   ABO/RH(D) B POS   I-Stat CG4 Lactic Acid, ED     Status: None   Collection Time:  11/01/15  9:30 AM  Result Value Ref Range   Lactic Acid, Venous 1.51 0.5 - 2.0 mmol/L  Prepare RBC     Status: None   Collection Time: 11/01/15 10:09 AM  Result Value Ref Range   Order Confirmation ORDER PROCESSED BY BLOOD BANK   Hemoglobin and hematocrit, blood     Status: Abnormal   Collection Time: 11/01/15 10:15 AM  Result Value Ref Range   Hemoglobin 10.5 (L) 12.0 - 15.0 g/dL   HCT 31.3 (L) 36.0 - 46.0 %   Ct Head Wo Contrast  11/01/2015  CLINICAL DATA:  Pain following fall EXAM: CT HEAD WITHOUT CONTRAST TECHNIQUE: Contiguous axial images were obtained from the base of the skull through the vertex without intravenous contrast. COMPARISON:  Brain MRI June 05, 2005 FINDINGS: Moderate diffuse atrophy is stable. There is a stable superior right frontal calcified meningioma without surrounding edema measuring 1.3 x 1.2 cm. There is a stable high left frontal calcified meningioma measuring 1.5 x 1.5 cm without surrounding edema. There is no new mass. There is no hemorrhage, extra-axial fluid collection, or midline shift. 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 This infarct extends to the posterior inferior left centrum semiovale, stable. There is patchy small vessel disease in the centra semiovale bilaterally, stable. There is evidence of prior infarct in the superomedial left basal ganglia region. No acute infarct evident. Bony calvarium appears intact. The mastoid air cells are clear. No intraorbital lesions. There is a retention cyst in the anterior left sphenoid sinus region. IMPRESSION: Stable bilateral vertex meningiomas, calcified, without surrounding edema. There is atrophy with prior infarct at the left frontal-parietal junction. There is small vessel disease in the centra semiovale bilaterally as well as prior infarct in the medial left basal ganglia region. No acute infarct evident. No hemorrhage. No edema. Small retention cyst left sphenoid  sinus region. Electronically Signed   By: Lowella Grip III M.D.   On: 11/01/2015 09:35   Ct Abdomen Pelvis W Contrast  11/01/2015  CLINICAL DATA:  Fall this morning. Left lower quadrant pain. Rectal bleeding. Initial encounter. EXAM: CT ABDOMEN AND PELVIS WITH CONTRAST TECHNIQUE: Multidetector CT imaging of the abdomen and pelvis was performed using the standard protocol following bolus administration of intravenous contrast. CONTRAST:  183m OMNIPAQUE IOHEXOL 300 MG/ML  SOLN COMPARISON:  01/07/2015 FINDINGS: Lower chest:  No acute findings. Hepatobiliary: No liver masses identified. Gallbladder is surgically absent. Mild dilatation of extrahepatic common bile duct is stable and likely related to prior cholecystectomy. Pancreas: No mass, inflammatory changes, or other significant abnormality. Spleen: No evidence of splenomegaly. Multiple low-attenuation lesions throughout the spleen show no significant change, with index lesion in the posterior splenic dome measuring 2.2 cm on image 19/series 201. Adrenals/Urinary Tract: No masses identified. No evidence of hydronephrosis. Stomach/Bowel: No evidence of obstruction, inflammatory process, or abnormal fluid collections. Severe diverticulosis again seen involving the descending and sigmoid colon, however there is no evidence of diverticulitis. Vascular/Lymphatic: No pathologically enlarged lymph nodes. No evidence of abdominal aortic aneurysm. Aortic atherosclerotic plaque noted. Reproductive: Prior hysterectomy noted. Adnexal regions are unremarkable in appearance. Other: No evidence of abdominal parenchymal organ injury or hemoperitoneum.  Musculoskeletal: No suspicious bone lesions identified. No fractures identified. Advanced lumbar spine degenerative changes noted. IMPRESSION: No evidence of visceral injury, hemoperitoneum, or other acute findings. Stable multiple nonspecific low-attenuation splenic lesions, most likely benign in etiology. No evidence of  splenomegaly or lymphadenopathy. Colonic diverticulosis. No radiographic evidence of diverticulitis. Electronically Signed   By: Earle Gell M.D.   On: 11/01/2015 09:42   Dg Knee Complete 4 Views Right  11/01/2015  CLINICAL DATA:  Pain following fall EXAM: RIGHT KNEE - COMPLETE 4+ VIEW COMPARISON:  None. FINDINGS: Frontal, lateral, and bilateral oblique views were obtained. There is no demonstrable fracture or dislocation. There is a small joint effusion. There is moderate narrowing of the patellofemoral joint. There is spurring in all compartments. No erosive change. IMPRESSION: Small joint effusion. 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 No fracture or dislocation. Electronically Signed   By: Lowella Grip III M.D.   On: 11/01/2015 08:32   Dg Foot Complete Left  11/01/2015  CLINICAL DATA:  Pain following fall EXAM: LEFT FOOT - COMPLETE 3+ VIEW COMPARISON:  None. FINDINGS: Frontal, oblique, and lateral views obtained. There is no apparent fracture or dislocation. There is narrowing of all MTP, PIP, and DIP joints. There is a cystic area in the lateral proximal fourth metatarsal which appears benign. There is spurring in the dorsal midfoot. There are posterior and inferior calcaneal spurs. There is mild pes cavus. IMPRESSION: Multilevel osteoarthritic change. Calcaneal spurs. Benign appearing cystic area in the lateral proximal fourth metatarsal. No acute fracture or dislocation. Mild pes cavus. Electronically Signed   By: Lowella Grip III M.D.   On: 11/01/2015 08:35    ROS: General:no colds or fevers, no weight changes Skin:no rashes or ulcers HEENT:no blurred vision, no congestion CV:see HPI PUL:see HPI GI:no diarrhea constipation or melena, no indigestion GU:no hematuria, no dysuria MS:no joint pain, no claudication Neuro:no syncope, no lightheadedness, + weakness yesterday evening and today Endo:no diabetes, + thyroid disease   Blood pressure 109/65, pulse 96,  temperature 97.8 F (36.6 C), temperature source Oral, resp. rate 14, height _0  (1.727 m), weight 165 lb (74.844 kg), SpO2 99 %.  Wt Readings from Last 3 Encounters:  11/01/15 165 lb (74.844 kg)    PE: General:Pleasant affect, NAD but obviously not feeling well Skin:Warm and dry, brisk capillary refill HEENT:normocephalic, sclera clear, mucus membranes moist Neck:supple, no JVD, no bruits  Heart:irreg irreg without murmur, gallup, rub or click Lungs:clear, ant. without rales, rhonchi, or wheezes YNW:GNFA, non tender, + BS, do not palpate liver spleen or masses Ext:no to trace lower ext edema, 2+ pedal pulses, 2+ radial pulses Neuro:alert and oriented X 3, MAE, follows commands, + facial symmetry    Assessment/Plan Principal Problem:   GI bleed Active Problems:   Fall   Physical deconditioning   Atrial fibrillation (HCC)   Hypotension   History of CVA (cerebrovascular accident)  Atrial fibrillation New per pt and family. CHA2DS2VASc 6 but now with acute symptomatic GI bleed - has put out 1500cc bloody stool since arrival.  No anticoagulation - would be life threatening at this point.  Being transfused.  Rate is controlled.  Will follow   Agree with Echo.   Acute GI bleed with 7 or more bloody stools and weakness.  Fall- general aches and pains  Hypotension. Improving with blood and fluids.   Hx of CVA  [facility] R  Nurse Practitioner Certified [place] Pager [phone] or after 5pm or weekends call [phone]  11/01/2015, 12:58 PM   History and all data above reviewed.  Patient examined.  I agree with the findings as above.  The patient exam reveals YOM:[id number]  ,  Lungs: Clear  ,  Abd: Positive bowel sounds, no rebound no guarding, Ext No edema  .  All available labs, radiology testing, previous records reviewed. Agree with documented assessment and plan. Atrial fib:  Unclear onset.  Not an anticoagulation candidate obviously.  Ms. Jillian Cantrell has a CHA2DS2 - VASc score of 9 with a risk of stroke of 9.7%.  Rate is actually well controlled.  Syncope probably related to bleeding rather than fib.    Jillian Cantrell  4:00 PM  11/01/2015

## 2015-11-01 NOTE — ED Notes (Signed)
Chrislyn Catering manager gave report to [facility], pt will go upstairs after CT Angio

## 2015-11-01 NOTE — Progress Notes (Signed)
  Echocardiogram 2D Echocardiogram has been performed.  Bobbye Charleston 11/01/2015, 4:02 PM

## 2015-11-01 NOTE — ED Notes (Signed)
Echo at bedside

## 2015-11-01 NOTE — ED Notes (Signed)
MD at bedside. 

## 2015-11-01 NOTE — ED Provider Notes (Addendum)
CSN: [id number]     Arrival date & time 11/01/15  [id number] History   First MD Initiated Contact with Patient 11/01/15 0546     Chief Complaint  Patient presents with  . Fall  . Rectal Bleeding     (Consider location/radiation/quality/duration/timing/severity/associated sxs/prior Treatment) HPI Comments: Patient presents to the emergency by referring evaluation of a fall. Patient reports that she was attempting to walk, became weak and fell. She did hit her head on the ground, no loss of consciousness. She reports mild headache. She does take Plavix. Patient not expecting neck or back pain. There is no reported hip or extremity pain.  Patient reports that she has had at least 7 episodes of dark red blood per rectum since yesterday. She has been noticing mild pain in her lower abdomen, predominantly on the left side. She has not had fever. No history of GI bleeding or diverticulitis.  Patient is a 82 y.o. female presenting with fall and hematochezia.  Fall Associated symptoms include abdominal pain and headaches.  Rectal Bleeding Associated symptoms: abdominal pain     Past Medical History  Diagnosis Date  . Stroke ([place])   . Hypertension   . Thyroid disease   . Cancer [facility])     thyroid cancer s/p thyroidectomy   Past Surgical History  Procedure Laterality Date  . Cholecystectomy    . Abdominal hysterectomy    . Appendectomy    . Varicose vein surgery    . Thyroidectomy     No family history on file. Social History  Substance Use Topics  . Smoking status: Former Smoker    Quit date: 10/14/1968  . Smokeless tobacco: None  . Alcohol Use: No   OB History    No data available     Review of Systems  Gastrointestinal: Positive for abdominal pain, hematochezia and anal bleeding.  Neurological: Positive for weakness and headaches.  All other systems reviewed and are negative.     Allergies  Codeine  Home Medications   Prior to Admission medications   Medication Sig  Start Date End Date Taking? Authorizing Provider  aspirin 81 MG EC tablet Take 81 mg by mouth daily. Swallow whole.    Historical Provider, MD  bisoprolol-hydrochlorothiazide [facility]) 2.5-6.25 MG per tablet Take 1 tablet by mouth daily.    Historical Provider, MD  celecoxib (CELEBREX) 200 MG capsule Take 200 mg by mouth daily.    Historical Provider, MD  clopidogrel (PLAVIX) 75 MG tablet Take 75 mg by mouth daily.    Historical Provider, MD  enalapril (VASOTEC) 5 MG tablet Take 5 mg by mouth daily.    Historical Provider, MD  furosemide (LASIX) 40 MG tablet Take 40 mg by mouth daily.    Historical Provider, MD  levothyroxine (SYNTHROID, LEVOTHROID) 175 MCG tablet Take 175 mcg by mouth daily before breakfast.    Historical Provider, MD  potassium chloride SA (K-DUR,KLOR-CON) 20 MEQ tablet Take 20 mEq by mouth daily.    Historical Provider, MD  simvastatin (ZOCOR) 20 MG tablet Take 20 mg by mouth daily.    Historical Provider, MD  traMADol (ULTRAM) 50 MG tablet Take 1 tablet (50 mg total) by mouth every 6 (six) hours as needed. 01/07/15   Heather Laisure, PA-C   BP 88/61 mmHg  Pulse 93  Temp(Src) 98.5 F (36.9 C) (Oral)  Resp 15  Ht 5\' 8"  (1.727 m)  Wt 165 lb (74.844 kg)  BMI 25.09 kg/m2  SpO2 96% Physical Exam  Constitutional: She is  oriented to person, place, and time. She appears well-developed and well-nourished. No distress.  HENT:  Head: Normocephalic and atraumatic.  Right Ear: Hearing normal.  Left Ear: Hearing normal.  Nose: Nose normal.  Mouth/Throat: Oropharynx is clear and moist and mucous membranes are normal.  Eyes: Conjunctivae and EOM are normal. Pupils are equal, round, and reactive to light.  Neck: Normal range of motion. Neck supple.  Cardiovascular: Regular rhythm, S1 normal and S2 normal.  Exam reveals no gallop and no friction rub.   No murmur heard. Pulmonary/Chest: Effort normal and breath sounds normal. No respiratory distress. She exhibits no tenderness.   Abdominal: Soft. Normal appearance and bowel sounds are normal. There is no hepatosplenomegaly. There is no tenderness. There is no rebound, no guarding, no tenderness at McBurney's point and negative Murphy's sign. No hernia.  Genitourinary: Rectal exam shows external hemorrhoid. Rectal exam shows no fissure, no mass and no tenderness. Guaiac positive stool (gross blood present).  Musculoskeletal: Normal range of motion.  Neurological: She is alert and oriented to person, place, and time. She has normal strength. No cranial nerve deficit or sensory deficit. Coordination normal. GCS eye subscore is 4. GCS verbal subscore is 5. GCS motor subscore is 6.  Skin: Skin is warm, dry and intact. No rash noted. No cyanosis.  Psychiatric: She has a normal mood and affect. Her speech is normal and behavior is normal. Thought content normal.  Nursing note and vitals reviewed.   ED Course  Procedures (including critical care time) Labs Review Labs Reviewed  CBC WITH DIFFERENTIAL/PLATELET - Abnormal; Notable for the following:    RBC 3.58 (*)    Hemoglobin 11.5 (*)    HCT 33.4 (*)    All other components within normal limits  POC OCCULT BLOOD, ED - Abnormal; Notable for the following:    Fecal Occult Bld POSITIVE (*)    All other components within normal limits  COMPREHENSIVE METABOLIC PANEL  TROPONIN I  I-STAT CG4 LACTIC ACID, ED  TYPE AND SCREEN  ABO/RH    Imaging Review No results found. I have personally reviewed and evaluated these images and lab results as part of my medical decision-making.   EKG Interpretation   Date/Time:  Wednesday November 01 2015 07:46:11 EST Ventricular Rate:  93 PR Interval:    QRS Duration: 89 QT Interval:  360 QTC Calculation: 448 R Axis:   -3 Text Interpretation:  Atrial fibrillation Anterior infarct, old new onset  afib Confirmed by Dawson Hollman  MD, [place] [phone]) on 11/01/2015 7:55:07 AM      MDM   Final diagnoses:  Rectal bleeding  Atrial  fibrillation, unspecified type ([place])   rectal bleeding  She brought to the emergency part for evaluation after a fall. Will perform CT head rule out intracranial bleeding because she takes Plavix, but she has normal neurologic examination.  Other questioning and examination, however, reveals that she has had ongoing rectal bleeding since yesterday. She did have mild left lower quadrant tenderness as well. Will require labs to determine level of anemia. Cardiac evaluation as well. Patient will have CT abdomen and pelvis to further evaluate the abdominal pain and tenderness.  Sign out to oncoming ER physician to follow-up on results.    Orpah Greek, MD 11/01/15 [place], MD 11/01/15 [phone]

## 2015-11-01 NOTE — ED Notes (Addendum)
Patient arrived to ED via [facility]. EMS reports that patient fell at home at approx 0400. No LOC. No apparent injuries. C/o feet hurting. EMS also reported that patient c/o blood in urine since yesterday and frequent urination. VSS. BP 126/68, Pulse ranging 70s-90s (A Fib), Temp 98.9 Ax. CBG 185. Patient is taking Plavix.

## 2015-11-01 NOTE — ED Notes (Signed)
Cardiologist NP at bedside

## 2015-11-01 NOTE — ED Notes (Signed)
Jillian Holstein, NP,  at bedside.

## 2015-11-01 NOTE — H&P (Signed)
[facility] History and Physical  Jillian Cantrell [id number] DOB: 25-Jul-1927 DOA: 11/01/2015  Referring physician: Dr.  PCP: Geoffery Lyons, MD   Chief Complaint: Falls and bloody stools  HPI: Jillian Cantrell is a 82 y.o. female  Fall. Patient states that she fell this morning after she became weak all over. When she fell she hit her head. Denies any chest pains, palpitations, dizziness, lightheadedness, vertigo, palpitations, LOC. Persistent headache since that time. Denies any loss of bowel or bladder function or tongue biting. Complete recollection of the entire event.  Patient also reports multiple bloody bowel movements. Started 1 day ago. 7 episodes in past 24 hours. Lower abdominal crampy pain.Marland Kitchen Has not tried anything for her symptoms. Nothing makes her symptoms better or worse. Problem is intermittent.  After patient's fall she pressed her life alert button was called EMS to her home for evaluation   Review of Systems:  Constitutional:  No weight loss, night sweats, Fevers, chills, fatigue.  HEENT:  No headaches, Difficulty swallowing,Tooth/dental problems,Sore throat, Cardio-vascular:  No chest pain, Orthopnea, PND, swelling in lower extremities, anasarca, dizziness, palpitations  GI:  No heartburn, indigestion, nausea, vomiting, Resp:   No shortness of breath with exertion or at rest. No excess mucus, no productive cough, No non-productive cough, No coughing up of blood.No change in color of mucus.No wheezing.No chest wall deformity  Skin:  no rash or lesions.  GU:  no dysuria, change in color of urine, no urgency or frequency. No flank pain.  Musculoskeletal:   No joint pain or swelling. No decreased range of motion. No back pain.  Psych:  No change in mood or affect. No depression or anxiety. No memory loss.  Neuro:  No change in sensation, unilateral strength, or cognitive abilities  All other systems were reviewed and are negative.  Past Medical  History  Diagnosis Date  . Stroke ([place])   . Hypertension   . Thyroid disease   . Cancer [facility])     thyroid cancer s/p thyroidectomy   Past Surgical History  Procedure Laterality Date  . Cholecystectomy    . Abdominal hysterectomy    . Appendectomy    . Varicose vein surgery    . Thyroidectomy     Social History:  reports that she quit smoking about 47 years ago. She does not have any smokeless tobacco history on file. She reports that she does not drink alcohol or use illicit drugs.  Allergies  Allergen Reactions  . Codeine Hives    Family History  Problem Relation Age of Onset  . Cancer Father     leukemia     Prior to Admission medications   Medication Sig Start Date End Date Taking? Authorizing Provider  aspirin 81 MG EC tablet Take 81 mg by mouth daily. Swallow whole.    Historical Provider, MD  bisoprolol-hydrochlorothiazide [facility]) 2.5-6.25 MG per tablet Take 1 tablet by mouth daily.    Historical Provider, MD  celecoxib (CELEBREX) 200 MG capsule Take 200 mg by mouth daily.    Historical Provider, MD  clopidogrel (PLAVIX) 75 MG tablet Take 75 mg by mouth daily.    Historical Provider, MD  enalapril (VASOTEC) 5 MG tablet Take 5 mg by mouth daily.    Historical Provider, MD  furosemide (LASIX) 40 MG tablet Take 40 mg by mouth daily.    Historical Provider, MD  levothyroxine (SYNTHROID, LEVOTHROID) 175 MCG tablet Take 175 mcg by mouth daily before breakfast.    Historical Provider, MD  potassium chloride SA (K-DUR,KLOR-CON) 20 MEQ tablet Take 20 mEq by mouth daily.    Historical Provider, MD  simvastatin (ZOCOR) 20 MG tablet Take 20 mg by mouth daily.    Historical Provider, MD  traMADol (ULTRAM) 50 MG tablet Take 1 tablet (50 mg total) by mouth every 6 (six) hours as needed. 01/07/15   Hyman Bible, PA-C   Physical Exam: Filed Vitals:   11/01/15 1015 11/01/15 1030 11/01/15 1045 11/01/15 1105  BP: 96/56 108/64 107/76 83/44  Pulse: 99 105 48 96  Temp:  98.5 F (36.9  C)  97.8 F (36.6 C)  TempSrc:  Oral  Oral  Resp: 18 23 21 14   Height:      Weight:      SpO2: 99% 98% 94% 98%    Wt Readings from Last 3 Encounters:  11/01/15 74.844 kg (165 lb)    General: Elderly and frail appearing Eyes:  PERRL, EOMI, normal lids, iris ENT:  grossly normal hearing, lips & tongue Neck:  no LAD, masses or thyromegaly Cardiovascular: irregularly irregular no m/r/g. No LE edema.  Respiratory:  CTA bilaterally, no w/r/r. Normal respiratory effort. Abdomen:  soft, ntnd Skin:  no rash or induration seen on limited exam Musculoskeletal:  grossly normal tone BUE/BLE Psychiatric:  grossly normal mood and affect, speech fluent and appropriate Neurologic:  CN 2-12 grossly intact, moves all extremities in coordinated fashion. 4/5 strength in RUE and RLE w/ 5/5 strength in LUE and LLE.           Labs on Admission:  Basic Metabolic Panel:  Recent Labs Lab 11/01/15 0651  NA 143  K 3.9  CL 106  CO2 26  GLUCOSE 122*  BUN 27*  CREATININE 0.95  CALCIUM 9.3   Liver Function Tests:  Recent Labs Lab 11/01/15 0651  AST 17  ALT 9*  ALKPHOS 72  BILITOT 0.4  PROT 5.4*  ALBUMIN 2.9*   No results for input(s): LIPASE, AMYLASE in the last 168 hours. No results for input(s): AMMONIA in the last 168 hours. CBC:  Recent Labs Lab 11/01/15 0651 11/01/15 1015  WBC 8.7  --   NEUTROABS 6.7  --   HGB 11.5* 10.5*  HCT 33.4* 31.3*  MCV 93.3  --   PLT 188  --    Cardiac Enzymes:  Recent Labs Lab 11/01/15 0651  TROPONINI <0.03    BNP (last 3 results) No results for input(s): BNP in the last 8760 hours.  ProBNP (last 3 results) No results for input(s): PROBNP in the last 8760 hours.   CREATININE: 0.95 (11/01/15 0651) Estimated creatinine clearance - 42.9 mL/min  CBG: No results for input(s): GLUCAP in the last 168 hours.  Radiological Exams on Admission: Ct Head Wo Contrast  11/01/2015  CLINICAL DATA:  Pain following fall EXAM: CT HEAD WITHOUT  CONTRAST TECHNIQUE: Contiguous axial images were obtained from the base of the skull through the vertex without intravenous contrast. COMPARISON:  Brain MRI June 05, 2005 FINDINGS: Moderate diffuse atrophy is stable. There is a stable superior right frontal calcified meningioma without surrounding edema measuring 1.3 x 1.2 cm. There is a stable high left frontal calcified meningioma measuring 1.5 x 1.5 cm without surrounding edema. There is no new mass. There is no hemorrhage, extra-axial fluid collection, or midline shift. 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 This infarct extends to the posterior inferior left centrum semiovale, stable. There is patchy small vessel disease in the centra  semiovale bilaterally, stable. There is evidence of prior infarct in the superomedial left basal ganglia region. No acute infarct evident. Bony calvarium appears intact. The mastoid air cells are clear. No intraorbital lesions. There is a retention cyst in the anterior left sphenoid sinus region. IMPRESSION: Stable bilateral vertex meningiomas, calcified, without surrounding edema. There is atrophy with prior infarct at the left frontal-parietal junction. There is small vessel disease in the centra semiovale bilaterally as well as prior infarct in the medial left basal ganglia region. No acute infarct evident. No hemorrhage. No edema. Small retention cyst left sphenoid sinus region. Electronically Signed   By: Lowella Grip III M.D.   On: 11/01/2015 09:35   Ct Abdomen Pelvis W Contrast  11/01/2015  CLINICAL DATA:  Fall this morning. Left lower quadrant pain. Rectal bleeding. Initial encounter. EXAM: CT ABDOMEN AND PELVIS WITH CONTRAST TECHNIQUE: Multidetector CT imaging of the abdomen and pelvis was performed using the standard protocol following bolus administration of intravenous contrast. CONTRAST:  123mL OMNIPAQUE IOHEXOL 300 MG/ML  SOLN COMPARISON:  01/07/2015 FINDINGS:  Lower chest:  No acute findings. Hepatobiliary: No liver masses identified. Gallbladder is surgically absent. Mild dilatation of extrahepatic common bile duct is stable and likely related to prior cholecystectomy. Pancreas: No mass, inflammatory changes, or other significant abnormality. Spleen: No evidence of splenomegaly. Multiple low-attenuation lesions throughout the spleen show no significant change, with index lesion in the posterior splenic dome measuring 2.2 cm on image 19/series 201. Adrenals/Urinary Tract: No masses identified. No evidence of hydronephrosis. Stomach/Bowel: No evidence of obstruction, inflammatory process, or abnormal fluid collections. Severe diverticulosis again seen involving the descending and sigmoid colon, however there is no evidence of diverticulitis. Vascular/Lymphatic: No pathologically enlarged lymph nodes. No evidence of abdominal aortic aneurysm. Aortic atherosclerotic plaque noted. Reproductive: Prior hysterectomy noted. Adnexal regions are unremarkable in appearance. Other: No evidence of abdominal parenchymal organ injury or hemoperitoneum. Musculoskeletal: No suspicious bone lesions identified. No fractures identified. Advanced lumbar spine degenerative changes noted. IMPRESSION: No evidence of visceral injury, hemoperitoneum, or other acute findings. Stable multiple nonspecific low-attenuation splenic lesions, most likely benign in etiology. No evidence of splenomegaly or lymphadenopathy. Colonic diverticulosis. No radiographic evidence of diverticulitis. Electronically Signed   By: Earle Gell M.D.   On: 11/01/2015 09:42   Dg Knee Complete 4 Views Right  11/01/2015  CLINICAL DATA:  Pain following fall EXAM: RIGHT KNEE - COMPLETE 4+ VIEW COMPARISON:  None. FINDINGS: Frontal, lateral, and bilateral oblique views were obtained. There is no demonstrable fracture or dislocation. There is a small joint effusion. There is moderate narrowing of the patellofemoral joint. There  is spurring in all compartments. No erosive change. IMPRESSION: Small joint effusion. 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 No fracture or dislocation. Electronically Signed   By: Lowella Grip III M.D.   On: 11/01/2015 08:32   Dg Foot Complete Left  11/01/2015  CLINICAL DATA:  Pain following fall EXAM: LEFT FOOT - COMPLETE 3+ VIEW COMPARISON:  None. FINDINGS: Frontal, oblique, and lateral views obtained. There is no apparent fracture or dislocation. There is narrowing of all MTP, PIP, and DIP joints. There is a cystic area in the lateral proximal fourth metatarsal which appears benign. There is spurring in the dorsal midfoot. There are posterior and inferior calcaneal spurs. There is mild pes cavus. IMPRESSION: Multilevel osteoarthritic change. Calcaneal spurs. Benign appearing cystic area in the lateral proximal fourth metatarsal. No acute fracture or dislocation. Mild pes cavus. Electronically Signed   By: Lowella Grip  III M.D.   On: 11/01/2015 08:35    EKG: Independently reviewed. Afib. Low voltage. No sign of ACS.  Assessment/Plan Principal Problem:   GI bleed Active Problems:   Fall   Physical deconditioning   Atrial fibrillation (HCC)   Hypotension   History of CVA (cerebrovascular accident)   GI bleed: Hgb 10.5 (previous 14). 1g/dL drop since coming to ED. crampy abdominal pain and large volume bloody BMs in ED. Suspect diverticular bleed vs upper. GI consulted and will evaluate. Unsure of last colonoscopy but likely >60yrs ago.  - Stepdown - 2 Units PRBC (per ED) - trend Hgb Q6 - f/u GI recs - consider nuclear bleed study - NPO - stop ASA and plavix (family aware of increased strok risk) - PPI (DC if determined lower GI)  Fall: mechanical though w/ r/o from arrhythmia with new-onset A. Fib. Obvious physical deconditioning.  - PT/OT - Nutrition consult  Afib: New onset. CHA2DS2-VASc  - 7 (11.2% year risk stroke). HAS-BLED - 5 (9.1% year bleed  risk). Anticoagulation discussion had with family and feel that she is not an anticoagulation candidate at this time. Aware of risk of repeat stroke. Troponin nml.  - Tele - Cardiology consult - Echo  Hypotension: ~BP 90/60s. - Hold home antihypertensives - blood and IVF  H/o CVA: R sided deficits since stroke. On ASA and plavix since that time ~74yrs ago.  - Stop ASA and Plavix due to brisk GI bleed - PT/OT as above  Hypothyroid: - continue synthroid when taking PO  HLD: - continue simvastatin when taking PO   Code Status: FULL - considering to changing to DNR  DVT Prophylaxis: SCD Family Communication: son and daughter in law Disposition Plan: Pending Improvement    MERRELL, DAVID Lenna Sciara, MD Family Medicine [facility] [email or web address] Password [email or web address]

## 2015-11-02 DIAGNOSIS — R627 Adult failure to thrive: Secondary | ICD-10-CM

## 2015-11-02 DIAGNOSIS — I481 Persistent atrial fibrillation: Secondary | ICD-10-CM

## 2015-11-02 DIAGNOSIS — D62 Acute posthemorrhagic anemia: Secondary | ICD-10-CM | POA: Diagnosis present

## 2015-11-02 LAB — TYPE AND SCREEN
ABO/RH(D): B POS
Antibody Screen: NEGATIVE
Unit division: 0
Unit division: 0

## 2015-11-02 LAB — CBC
HCT: 30.8 % — ABNORMAL LOW (ref 36.0–46.0)
HCT: 30 % — ABNORMAL LOW (ref 36.0–46.0)
HEMOGLOBIN: 10.5 g/dL — AB (ref 12.0–15.0)
Hemoglobin: 10 g/dL — ABNORMAL LOW (ref 12.0–15.0)
MCH: 30.9 pg (ref 26.0–34.0)
MCH: 30.9 pg (ref 26.0–34.0)
MCHC: 34.1 g/dL (ref 30.0–36.0)
MCHC: 33.3 g/dL (ref 30.0–36.0)
MCV: 90.6 fL (ref 78.0–100.0)
MCV: 92.6 fL (ref 78.0–100.0)
PLATELETS: 174 10*3/uL (ref 150–400)
PLATELETS: 170 10*3/uL (ref 150–400)
RBC: 3.4 MIL/uL — AB (ref 3.87–5.11)
RBC: 3.24 MIL/uL — AB (ref 3.87–5.11)
RDW: 14.6 % (ref 11.5–15.5)
RDW: 14.7 % (ref 11.5–15.5)
WBC: 7.4 10*3/uL (ref 4.0–10.5)
WBC: 7.4 10*3/uL (ref 4.0–10.5)

## 2015-11-02 LAB — BASIC METABOLIC PANEL
ANION GAP: 5 (ref 5–15)
BUN: 24 mg/dL — ABNORMAL HIGH (ref 6–20)
CALCIUM: 8.8 mg/dL — AB (ref 8.9–10.3)
CO2: 25 mmol/L (ref 22–32)
CREATININE: 0.93 mg/dL (ref 0.44–1.00)
Chloride: 113 mmol/L — ABNORMAL HIGH (ref 101–111)
GFR, EST NON AFRICAN AMERICAN: 54 mL/min — AB (ref >60)
GLUCOSE: 105 mg/dL — AB (ref 65–99)
Potassium: 3.6 mmol/L (ref 3.5–5.1)
Sodium: 143 mmol/L (ref 135–145)

## 2015-11-02 MED ORDER — DILTIAZEM HCL 30 MG PO TABS
30.0000 mg | ORAL_TABLET | Freq: Three times a day (TID) | ORAL | Status: DC
  Administered 2015-11-02: 30 mg via ORAL
  Filled 2015-11-02: qty 1

## 2015-11-02 MED ORDER — DILTIAZEM HCL 30 MG PO TABS
30.0000 mg | ORAL_TABLET | Freq: Three times a day (TID) | ORAL | Status: DC
  Administered 2015-11-02 – 2015-11-04 (×6): 30 mg via ORAL
  Filled 2015-11-02 (×7): qty 1

## 2015-11-02 NOTE — Progress Notes (Signed)
[facility] PROGRESS NOTE  Jillian Cantrell [id number] DOB: 11-01-1926 DOA: 11/01/2015 PCP: Geoffery Lyons, MD  Assessment/Plan:  Principal Problem:   GI bleed, likely diverticular. No hematochezia since yesterday afternoon and h/h fairly stable. Will start clears. GI following and recommending supportive care. Family wondering if and when colonoscopy would be performed. Defer to GI. Start clear liquids  Active Problems:   Fall   Physical deconditioning: PT eval pending. Lives alone with an aide coming in the mornings. Has history of previous stroke and right hemiparesis. Suspect might need home health versus placement.   Atrial fibrillation [facility]): Not a candidate for anticoagulation due to GI bleed. Aspirin and Plavix held. CHADSVASC 7. Cardiology starting cardizem for better rate control. Continue SDU for now. BPs labile   Hypotension resolving   History of CVA (cerebrovascular accident)   Acute blood loss anemia s/p 2 units pRBC Chaplain consult for recent loss of husband  Code Status:  full Family Communication:  Son and daughter-in-law at bedside Disposition Plan:  ? Depending on PT recommendations  Consultants:  GI  cardiology  Procedures:     Antibiotics:    HPI/Subjective: Recently lost her husband. Family reports that since then, her appetite has not been good.  Objective: Filed Vitals:   11/02/15 0800 11/02/15 1004  BP: 116/70 125/69  Pulse: 86   Temp: 98.4 F (36.9 C)   Resp: 7     Intake/Output Summary (Last 24 hours) at 11/02/15 1005 Last data filed at 11/02/15 0616  Gross per 24 hour  Intake 982.67 ml  Output   1250 ml  Net -267.33 ml   Filed Weights   11/01/15 0552 11/01/15 2020  Weight: 74.844 kg (165 lb) 78.4 kg (172 lb 13.5 oz)   Telemetry: Atrial fibrillation rate above 100 Exam:   General:  Flat affect. Occasionally tearful. Cooperative and comfortable.  Cardiovascular: Irregularly irregular. Fast. No murmurs gallops  rubs.  Respiratory: Clear to auscultation bilaterally without wheezes rhonchi or rales  Abdomen: Soft nontender nondistended  Ext: No clubbing cyanosis or edema  Neurologic: Right upper extremity strength 2/5. Right lower extremity strength 4/5  Basic Metabolic Panel:  Recent Labs Lab 11/01/15 0651 11/01/15 1015 11/02/15 0558  NA 143  --  143  K 3.9  --  3.6  CL 106  --  113*  CO2 26  --  25  GLUCOSE 122*  --  105*  BUN 27*  --  24*  CREATININE 0.95  --  0.93  CALCIUM 9.3  --  8.8*  MG  --  1.7  --    Liver Function Tests:  Recent Labs Lab 11/01/15 0651  AST 17  ALT 9*  ALKPHOS 72  BILITOT 0.4  PROT 5.4*  ALBUMIN 2.9*   No results for input(s): LIPASE, AMYLASE in the last 168 hours. No results for input(s): AMMONIA in the last 168 hours. CBC:  Recent Labs Lab 11/01/15 0651 11/01/15 1015 11/01/15 1703 11/01/15 2150 11/02/15 0558  WBC 8.7  --  11.0* 10.9* 7.4  NEUTROABS 6.7  --   --   --   --   HGB 11.5* 10.5* 11.6* 11.5* 10.5*  HCT 33.4* 31.3* 33.9* 33.7* 30.8*  MCV 93.3  --  92.6 91.1 90.6  PLT 188  --  171 181 174   Cardiac Enzymes:  Recent Labs Lab 11/01/15 0651  TROPONINI <0.03   BNP (last 3 results) No results for input(s): BNP in the last 8760 hours.  ProBNP (last 3 results)  No results for input(s): PROBNP in the last 8760 hours.  CBG:  Recent Labs Lab 11/01/15 2230  GLUCAP 122*    Recent Results (from the past 240 hour(s))  MRSA PCR Screening     Status: None   Collection Time: 11/01/15  8:51 PM  Result Value Ref Range Status   MRSA by PCR NEGATIVE NEGATIVE Final    Comment:        The GeneXpert MRSA Assay (FDA approved for NASAL specimens only), is one component of a comprehensive MRSA colonization surveillance program. It is not intended to diagnose MRSA infection nor to guide or monitor treatment for MRSA infections.      Studies: Ct Head Wo Contrast  11/01/2015  CLINICAL DATA:  Pain following fall EXAM: CT  HEAD WITHOUT CONTRAST TECHNIQUE: Contiguous axial images were obtained from the base of the skull through the vertex without intravenous contrast. COMPARISON:  Brain MRI June 05, 2005 FINDINGS: Moderate diffuse atrophy is stable. There is a stable superior right frontal calcified meningioma without surrounding edema measuring 1.3 x 1.2 cm. There is a stable high left frontal calcified meningioma measuring 1.5 x 1.5 cm without surrounding edema. There is no new mass. There is no hemorrhage, extra-axial fluid collection, or midline shift. 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 This infarct extends to the posterior inferior left centrum semiovale, stable. There is patchy small vessel disease in the centra semiovale bilaterally, stable. There is evidence of prior infarct in the superomedial left basal ganglia region. No acute infarct evident. Bony calvarium appears intact. The mastoid air cells are clear. No intraorbital lesions. There is a retention cyst in the anterior left sphenoid sinus region. IMPRESSION: Stable bilateral vertex meningiomas, calcified, without surrounding edema. There is atrophy with prior infarct at the left frontal-parietal junction. There is small vessel disease in the centra semiovale bilaterally as well as prior infarct in the medial left basal ganglia region. No acute infarct evident. No hemorrhage. No edema. Small retention cyst left sphenoid sinus region. Electronically Signed   By: Lowella Grip III M.D.   On: 11/01/2015 09:35   Ct Abdomen Pelvis W Contrast  11/01/2015  CLINICAL DATA:  Fall this morning. Left lower quadrant pain. Rectal bleeding. Initial encounter. EXAM: CT ABDOMEN AND PELVIS WITH CONTRAST TECHNIQUE: Multidetector CT imaging of the abdomen and pelvis was performed using the standard protocol following bolus administration of intravenous contrast. CONTRAST:  119mL OMNIPAQUE IOHEXOL 300 MG/ML  SOLN COMPARISON:   01/07/2015 FINDINGS: Lower chest:  No acute findings. Hepatobiliary: No liver masses identified. Gallbladder is surgically absent. Mild dilatation of extrahepatic common bile duct is stable and likely related to prior cholecystectomy. Pancreas: No mass, inflammatory changes, or other significant abnormality. Spleen: No evidence of splenomegaly. Multiple low-attenuation lesions throughout the spleen show no significant change, with index lesion in the posterior splenic dome measuring 2.2 cm on image 19/series 201. Adrenals/Urinary Tract: No masses identified. No evidence of hydronephrosis. Stomach/Bowel: No evidence of obstruction, inflammatory process, or abnormal fluid collections. Severe diverticulosis again seen involving the descending and sigmoid colon, however there is no evidence of diverticulitis. Vascular/Lymphatic: No pathologically enlarged lymph nodes. No evidence of abdominal aortic aneurysm. Aortic atherosclerotic plaque noted. Reproductive: Prior hysterectomy noted. Adnexal regions are unremarkable in appearance. Other: No evidence of abdominal parenchymal organ injury or hemoperitoneum. Musculoskeletal: No suspicious bone lesions identified. No fractures identified. Advanced lumbar spine degenerative changes noted. IMPRESSION: No evidence of visceral injury, hemoperitoneum, or other acute  findings. Stable multiple nonspecific low-attenuation splenic lesions, most likely benign in etiology. No evidence of splenomegaly or lymphadenopathy. Colonic diverticulosis. No radiographic evidence of diverticulitis. Electronically Signed   By: Earle Gell M.D.   On: 11/01/2015 09:42   Dg Knee Complete 4 Views Right  11/01/2015  CLINICAL DATA:  Pain following fall EXAM: RIGHT KNEE - COMPLETE 4+ VIEW COMPARISON:  None. FINDINGS: Frontal, lateral, and bilateral oblique views were obtained. There is no demonstrable fracture or dislocation. There is a small joint effusion. There is moderate narrowing of the  patellofemoral joint. There is spurring in all compartments. No erosive change. IMPRESSION: Small joint effusion. 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 No fracture or dislocation. Electronically Signed   By: Lowella Grip III M.D.   On: 11/01/2015 08:32   Dg Foot Complete Left  11/01/2015  CLINICAL DATA:  Pain following fall EXAM: LEFT FOOT - COMPLETE 3+ VIEW COMPARISON:  None. FINDINGS: Frontal, oblique, and lateral views obtained. There is no apparent fracture or dislocation. There is narrowing of all MTP, PIP, and DIP joints. There is a cystic area in the lateral proximal fourth metatarsal which appears benign. There is spurring in the dorsal midfoot. There are posterior and inferior calcaneal spurs. There is mild pes cavus. IMPRESSION: Multilevel osteoarthritic change. Calcaneal spurs. Benign appearing cystic area in the lateral proximal fourth metatarsal. No acute fracture or dislocation. Mild pes cavus. Electronically Signed   By: Lowella Grip III M.D.   On: 11/01/2015 08:35   Ct Angio Abd/pel W/ And/or W/o  11/01/2015  CLINICAL DATA:  Acute onset of bright red blood per rectum. Generalized weakness and dizziness. Syncope and fall, with hypotension. Left lower quadrant abdominal pain. Initial encounter. EXAM: CTA ABDOMEN AND PELVIS wITHOUT AND WITH CONTRAST TECHNIQUE: Multidetector CT imaging of the abdomen and pelvis was performed using the standard protocol during bolus administration of intravenous contrast. Multiplanar reconstructed images and MIPs were obtained and reviewed to evaluate the vascular anatomy. CONTRAST:  129mL OMNIPAQUE IOHEXOL 300 MG/ML  SOLN COMPARISON:  CT of the abdomen and pelvis from 01/07/2015, and recent CT of the abdomen and pelvis performed earlier today at 9:19 a.m. FINDINGS: The visualized lung bases are clear. There is no evidence of aortic dissection. There is no evidence of aneurysmal dilatation. Note is made of relatively severe luminal  narrowing along the proximal celiac trunk, and moderate to severe luminal narrowing along the proximal superior mesenteric artery, both due to soft plaque. However, the celiac trunk and superior mesenteric artery remain patent. The inferior mesenteric artery also appears patent. Note is made of diminutive bilateral renal arteries, with dense calcification at the origins of the renal arteries; two right-sided renal arteries are seen. Scattered calcification is noted along the abdominal aorta and its branches. The inferior vena cava is unremarkable in appearance. The liver is unremarkable in appearance. Multiple low-attenuation lesions are again noted throughout the spleen, relatively stable from 2016. The patient is status post cholecystectomy, with clips noted at the gallbladder fossa. The pancreas and adrenal glands are unremarkable. The kidneys are unremarkable in appearance. There is no evidence of hydronephrosis. No renal or ureteral stones are seen. Minimal right-sided perinephric stranding is noted. No free fluid is identified. The small bowel is unremarkable in appearance. The stomach is within normal limits. No acute vascular abnormalities are seen. The patient is status post appendectomy. Residual contrast is noted within much of the colon, limiting evaluation for intraluminal hemorrhage. There does appear to be new mild diffuse  mucosal thickening along the distal transverse, descending and sigmoid colon, raising question for a mild infectious or inflammatory process. Trace surrounding soft tissue inflammation is noted. Scattered diverticulosis is noted along the distal transverse, descending and sigmoid colon, without evidence of diverticulitis. The bladder is mildly distended and filled with contrast. The patient is status post hysterectomy. No suspicious adnexal masses are seen. No inguinal lymphadenopathy is seen. No acute osseous abnormalities are identified. There is chronic osseous fusion at L4-L5.  Multilevel vacuum phenomenon is noted along the lumbar spine. Review of the MIP images confirms the above findings. IMPRESSION: 1. No evidence of aortic dissection. No evidence of aneurysmal dilatation. 2. Evaluation for intraluminal hemorrhage is markedly suboptimal given residual contrast within the colon. 3. Relatively severe luminal narrowing along the proximal celiac trunk, and moderate to severe luminal narrowing along the proximal superior mesenteric artery, both due to soft plaque. However, the celiac trunk and superior mesenteric artery remain patent. No definite evidence for bowel ischemia. 4. Apparent mild diffuse mucosal thickening along the distal transverse, descending and sigmoid colon, raising question for a mild infectious or inflammatory process; this is new from this morning. Trace surrounding soft tissue inflammation noted. Alternately, this could reflect reactive change secondary to contrast within the colon. 5. Diminutive bilateral renal arteries, with dense calcification at the origins of the renal arteries. Inferior mesenteric artery appears patent. Scattered calcification along the abdominal aorta and its branches. 6. Multiple low-attenuation lesions again noted throughout the spleen, relatively stable from 2016. 7. Scattered diverticulosis along the distal transverse, descending and sigmoid colon, without evidence of diverticulitis. 8. Chronic osseous fusion at L4-L5, with mild diffuse underlying degenerative change along the lumbar spine. Electronically Signed   By: Garald Balding M.D.   On: 11/01/2015 21:11    Scheduled Meds: . diltiazem  30 mg Oral 3 times per day  . nystatin   Topical TID  . [START ON 11/04/2015] pantoprazole (PROTONIX) IV  40 mg Intravenous Q12H  . sodium chloride  3 mL Intravenous Q12H   Continuous Infusions: . pantoprozole (PROTONIX) infusion 8 mg/hr (11/02/15 0000)    Time spent: 35 minutes  [facility] [email or web address],  password [facility] 11/02/2015, 10:05 AM  LOS: 1 day

## 2015-11-02 NOTE — Progress Notes (Signed)
SUBJECTIVE:  No complaints.  No SOB.  No pain   PHYSICAL EXAM Filed Vitals:   11/02/15 0530 11/02/15 0600 11/02/15 0700 11/02/15 0800  BP: 101/61 114/71 118/66 116/70  Pulse: 105 91 94 86  Temp:    98.4 F (36.9 C)  TempSrc:    Oral  Resp: 12 6 14 7   Height:      Weight:      SpO2:    95%   General:  No distress Lungs:  Clear Heart:  Irregular Abdomen:  Positive bowel sounds, no rebound no guarding Extremities:  No edema   LABS: Lab Results  Component Value Date   TROPONINI <0.03 11/01/2015   Results for orders placed or performed during the hospital encounter of 11/01/15 (from the past 24 hour(s))  I-Stat CG4 Lactic Acid, ED     Status: None   Collection Time: 11/01/15  9:30 AM  Result Value Ref Range   Lactic Acid, Venous 1.51 0.5 - 2.0 mmol/L  Prepare RBC     Status: None   Collection Time: 11/01/15 10:09 AM  Result Value Ref Range   Order Confirmation ORDER PROCESSED BY BLOOD BANK   Hemoglobin and hematocrit, blood     Status: Abnormal   Collection Time: 11/01/15 10:15 AM  Result Value Ref Range   Hemoglobin 10.5 (L) 12.0 - 15.0 g/dL   HCT 31.3 (L) 36.0 - 46.0 %  TSH     Status: Abnormal   Collection Time: 11/01/15 10:15 AM  Result Value Ref Range   TSH 0.030 (L) 0.350 - 4.500 uIU/mL  Magnesium     Status: None   Collection Time: 11/01/15 10:15 AM  Result Value Ref Range   Magnesium 1.7 1.7 - 2.4 mg/dL  T4, free     Status: Abnormal   Collection Time: 11/01/15 10:15 AM  Result Value Ref Range   Free T4 1.41 (H) 0.61 - 1.12 ng/dL  CBC     Status: Abnormal   Collection Time: 11/01/15  5:03 PM  Result Value Ref Range   WBC 11.0 (H) 4.0 - 10.5 K/uL   RBC 3.66 (L) 3.87 - 5.11 MIL/uL   Hemoglobin 11.6 (L) 12.0 - 15.0 g/dL   HCT 33.9 (L) 36.0 - 46.0 %   MCV 92.6 78.0 - 100.0 fL   MCH 31.7 26.0 - 34.0 pg   MCHC 34.2 30.0 - 36.0 g/dL   RDW 13.9 11.5 - 15.5 %   Platelets 171 150 - 400 K/uL  MRSA PCR Screening     Status: None   Collection Time:  11/01/15  8:51 PM  Result Value Ref Range   MRSA by PCR NEGATIVE NEGATIVE  CBC     Status: Abnormal   Collection Time: 11/01/15  9:50 PM  Result Value Ref Range   WBC 10.9 (H) 4.0 - 10.5 K/uL   RBC 3.70 (L) 3.87 - 5.11 MIL/uL   Hemoglobin 11.5 (L) 12.0 - 15.0 g/dL   HCT 33.7 (L) 36.0 - 46.0 %   MCV 91.1 78.0 - 100.0 fL   MCH 31.1 26.0 - 34.0 pg   MCHC 34.1 30.0 - 36.0 g/dL   RDW 14.3 11.5 - 15.5 %   Platelets 181 150 - 400 K/uL  Glucose, capillary     Status: Abnormal   Collection Time: 11/01/15 10:30 PM  Result Value Ref Range   Glucose-Capillary 122 (H) 65 - 99 mg/dL  Basic metabolic panel     Status: Abnormal   Collection  Time: 11/02/15  5:58 AM  Result Value Ref Range   Sodium 143 135 - 145 mmol/L   Potassium 3.6 3.5 - 5.1 mmol/L   Chloride 113 (H) 101 - 111 mmol/L   CO2 25 22 - 32 mmol/L   Glucose, Bld 105 (H) 65 - 99 mg/dL   BUN 24 (H) 6 - 20 mg/dL   Creatinine, Ser 0.93 0.44 - 1.00 mg/dL   Calcium 8.8 (L) 8.9 - 10.3 mg/dL   GFR calc non Af Amer 54 (L) >60 mL/min   GFR calc Af Amer >60 >60 mL/min   Anion gap 5 5 - 15  CBC     Status: Abnormal   Collection Time: 11/02/15  5:58 AM  Result Value Ref Range   WBC 7.4 4.0 - 10.5 K/uL   RBC 3.40 (L) 3.87 - 5.11 MIL/uL   Hemoglobin 10.5 (L) 12.0 - 15.0 g/dL   HCT 30.8 (L) 36.0 - 46.0 %   MCV 90.6 78.0 - 100.0 fL   MCH 30.9 26.0 - 34.0 pg   MCHC 34.1 30.0 - 36.0 g/dL   RDW 14.6 11.5 - 15.5 %   Platelets 174 150 - 400 K/uL    Intake/Output Summary (Last 24 hours) at 11/02/15 0905 Last data filed at 11/02/15 [id number]  Gross per 24 hour  Intake 982.67 ml  Output   2750 ml  Net -1767.33 ml     ASSESSMENT AND PLAN:  Atrial fib:  Rate has been elevated in the face of a GI bleed.   BP is labile.  Med titration will be difficult.  I will try Cardizem 30 mg tid.    Echo has been ordered.    Jeneen Rinks [facility] 11/02/2015 9:05 AM

## 2015-11-02 NOTE — Progress Notes (Signed)
PT Cancellation Note  Patient Details Name: Jillian Cantrell MRN: [id number] DOB: 12-22-1926   Cancelled Treatment:    Reason Eval/Treat Not Completed: Medical issues which prohibited therapy.  Per RN, pt on bed rest with foley placed last night due to tachycardia. Cardizem pill form ordered per MD and nursing states PT/OT to hold today. Will check back as appropriate and schedule allows. Thank you for the order.   Irwin Brakeman F 11/02/2015, 12:40 PM  [facility] [phone] (pager)

## 2015-11-02 NOTE — Progress Notes (Signed)
Utilization Review Completed.  

## 2015-11-02 NOTE — Progress Notes (Signed)
Subjective: No further bleeding since admission.  Feeling well.  Objective: Vital signs in last 24 hours: Temp:  [97.8 F (36.6 C)-98.7 F (37.1 C)] 98.7 F (37.1 C) (01/19 0400) Pulse Rate:  [48-144] 91 (01/19 0600) Resp:  [6-26] 6 (01/19 0600) BP: (80-134)/(44-84) 114/71 mmHg (01/19 0600) SpO2:  [94 %-100 %] 97 % (01/19 0400) Weight:  [78.4 kg (172 lb 13.5 oz)] 78.4 kg (172 lb 13.5 oz) (01/18 2020)    Intake/Output from previous day: 01/18 0701 - 01/19 0700 In: 1482.7 [I.V.:812.7; Blood:670] Out: 2750 [Urine:450; [id number] Intake/Output this shift:    General appearance: alert and no distress GI: soft, non-tender; bowel sounds normal; no masses,  no organomegaly  Lab Results:  Recent Labs  11/01/15 1703 11/01/15 2150 11/02/15 0558  WBC 11.0* 10.9* 7.4  HGB 11.6* 11.5* 10.5*  HCT 33.9* 33.7* 30.8*  PLT 171 181 174   BMET  Recent Labs  11/01/15 0651 11/02/15 0558  NA 143 143  K 3.9 3.6  CL 106 113*  CO2 26 25  GLUCOSE 122* 105*  BUN 27* 24*  CREATININE 0.95 0.93  CALCIUM 9.3 8.8*   LFT  Recent Labs  11/01/15 0651  PROT 5.4*  ALBUMIN 2.9*  AST 17  ALT 9*  ALKPHOS 72  BILITOT 0.4   PT/INR No results for input(s): LABPROT, INR in the last 72 hours. Hepatitis Panel No results for input(s): HEPBSAG, HCVAB, HEPAIGM, HEPBIGM in the last 72 hours. C-Diff No results for input(s): CDIFFTOX in the last 72 hours. Fecal Lactopherrin No results for input(s): FECLLACTOFRN in the last 72 hours.  Studies/Results: Ct Head Wo Contrast  11/01/2015  CLINICAL DATA:  Pain following fall EXAM: CT HEAD WITHOUT CONTRAST TECHNIQUE: Contiguous axial images were obtained from the base of the skull through the vertex without intravenous contrast. COMPARISON:  Brain MRI June 05, 2005 FINDINGS: Moderate diffuse atrophy is stable. There is a stable superior right frontal calcified meningioma without surrounding edema measuring 1.3 x 1.2 cm. There is a stable high left  frontal calcified meningioma measuring 1.5 x 1.5 cm without surrounding edema. There is no new mass. There is no hemorrhage, extra-axial fluid collection, or midline shift. 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 This infarct extends to the posterior inferior left centrum semiovale, stable. There is patchy small vessel disease in the centra semiovale bilaterally, stable. There is evidence of prior infarct in the superomedial left basal ganglia region. No acute infarct evident. Bony calvarium appears intact. The mastoid air cells are clear. No intraorbital lesions. There is a retention cyst in the anterior left sphenoid sinus region. IMPRESSION: Stable bilateral vertex meningiomas, calcified, without surrounding edema. There is atrophy with prior infarct at the left frontal-parietal junction. There is small vessel disease in the centra semiovale bilaterally as well as prior infarct in the medial left basal ganglia region. No acute infarct evident. No hemorrhage. No edema. Small retention cyst left sphenoid sinus region. Electronically Signed   By: Lowella Grip III M.D.   On: 11/01/2015 09:35   Ct Abdomen Pelvis W Contrast  11/01/2015  CLINICAL DATA:  Fall this morning. Left lower quadrant pain. Rectal bleeding. Initial encounter. EXAM: CT ABDOMEN AND PELVIS WITH CONTRAST TECHNIQUE: Multidetector CT imaging of the abdomen and pelvis was performed using the standard protocol following bolus administration of intravenous contrast. CONTRAST:  156mL OMNIPAQUE IOHEXOL 300 MG/ML  SOLN COMPARISON:  01/07/2015 FINDINGS: Lower chest:  No acute findings. Hepatobiliary: No liver  masses identified. Gallbladder is surgically absent. Mild dilatation of extrahepatic common bile duct is stable and likely related to prior cholecystectomy. Pancreas: No mass, inflammatory changes, or other significant abnormality. Spleen: No evidence of splenomegaly. Multiple low-attenuation lesions  throughout the spleen show no significant change, with index lesion in the posterior splenic dome measuring 2.2 cm on image 19/series 201. Adrenals/Urinary Tract: No masses identified. No evidence of hydronephrosis. Stomach/Bowel: No evidence of obstruction, inflammatory process, or abnormal fluid collections. Severe diverticulosis again seen involving the descending and sigmoid colon, however there is no evidence of diverticulitis. Vascular/Lymphatic: No pathologically enlarged lymph nodes. No evidence of abdominal aortic aneurysm. Aortic atherosclerotic plaque noted. Reproductive: Prior hysterectomy noted. Adnexal regions are unremarkable in appearance. Other: No evidence of abdominal parenchymal organ injury or hemoperitoneum. Musculoskeletal: No suspicious bone lesions identified. No fractures identified. Advanced lumbar spine degenerative changes noted. IMPRESSION: No evidence of visceral injury, hemoperitoneum, or other acute findings. Stable multiple nonspecific low-attenuation splenic lesions, most likely benign in etiology. No evidence of splenomegaly or lymphadenopathy. Colonic diverticulosis. No radiographic evidence of diverticulitis. Electronically Signed   By: Earle Gell M.D.   On: 11/01/2015 09:42   Dg Knee Complete 4 Views Right  11/01/2015  CLINICAL DATA:  Pain following fall EXAM: RIGHT KNEE - COMPLETE 4+ VIEW COMPARISON:  None. FINDINGS: Frontal, lateral, and bilateral oblique views were obtained. There is no demonstrable fracture or dislocation. There is a small joint effusion. There is moderate narrowing of the patellofemoral joint. There is spurring in all compartments. No erosive change. IMPRESSION: Small joint effusion. 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 No fracture or dislocation. Electronically Signed   By: Lowella Grip III M.D.   On: 11/01/2015 08:32   Dg Foot Complete Left  11/01/2015  CLINICAL DATA:  Pain following fall EXAM: LEFT FOOT - COMPLETE 3+  VIEW COMPARISON:  None. FINDINGS: Frontal, oblique, and lateral views obtained. There is no apparent fracture or dislocation. There is narrowing of all MTP, PIP, and DIP joints. There is a cystic area in the lateral proximal fourth metatarsal which appears benign. There is spurring in the dorsal midfoot. There are posterior and inferior calcaneal spurs. There is mild pes cavus. IMPRESSION: Multilevel osteoarthritic change. Calcaneal spurs. Benign appearing cystic area in the lateral proximal fourth metatarsal. No acute fracture or dislocation. Mild pes cavus. Electronically Signed   By: Lowella Grip III M.D.   On: 11/01/2015 08:35   Ct Angio Abd/pel W/ And/or W/o  11/01/2015  CLINICAL DATA:  Acute onset of bright red blood per rectum. Generalized weakness and dizziness. Syncope and fall, with hypotension. Left lower quadrant abdominal pain. Initial encounter. EXAM: CTA ABDOMEN AND PELVIS wITHOUT AND WITH CONTRAST TECHNIQUE: Multidetector CT imaging of the abdomen and pelvis was performed using the standard protocol during bolus administration of intravenous contrast. Multiplanar reconstructed images and MIPs were obtained and reviewed to evaluate the vascular anatomy. CONTRAST:  159mL OMNIPAQUE IOHEXOL 300 MG/ML  SOLN COMPARISON:  CT of the abdomen and pelvis from 01/07/2015, and recent CT of the abdomen and pelvis performed earlier today at 9:19 a.m. FINDINGS: The visualized lung bases are clear. There is no evidence of aortic dissection. There is no evidence of aneurysmal dilatation. Note is made of relatively severe luminal narrowing along the proximal celiac trunk, and moderate to severe luminal narrowing along the proximal superior mesenteric artery, both due to soft plaque. However, the celiac trunk and superior mesenteric artery remain patent. The inferior mesenteric artery also appears patent. Note  is made of diminutive bilateral renal arteries, with dense calcification at the origins of the renal  arteries; two right-sided renal arteries are seen. Scattered calcification is noted along the abdominal aorta and its branches. The inferior vena cava is unremarkable in appearance. The liver is unremarkable in appearance. Multiple low-attenuation lesions are again noted throughout the spleen, relatively stable from 2016. The patient is status post cholecystectomy, with clips noted at the gallbladder fossa. The pancreas and adrenal glands are unremarkable. The kidneys are unremarkable in appearance. There is no evidence of hydronephrosis. No renal or ureteral stones are seen. Minimal right-sided perinephric stranding is noted. No free fluid is identified. The small bowel is unremarkable in appearance. The stomach is within normal limits. No acute vascular abnormalities are seen. The patient is status post appendectomy. Residual contrast is noted within much of the colon, limiting evaluation for intraluminal hemorrhage. There does appear to be new mild diffuse mucosal thickening along the distal transverse, descending and sigmoid colon, raising question for a mild infectious or inflammatory process. Trace surrounding soft tissue inflammation is noted. Scattered diverticulosis is noted along the distal transverse, descending and sigmoid colon, without evidence of diverticulitis. The bladder is mildly distended and filled with contrast. The patient is status post hysterectomy. No suspicious adnexal masses are seen. No inguinal lymphadenopathy is seen. No acute osseous abnormalities are identified. There is chronic osseous fusion at L4-L5. Multilevel vacuum phenomenon is noted along the lumbar spine. Review of the MIP images confirms the above findings. IMPRESSION: 1. No evidence of aortic dissection. No evidence of aneurysmal dilatation. 2. Evaluation for intraluminal hemorrhage is markedly suboptimal given residual contrast within the colon. 3. Relatively severe luminal narrowing along the proximal celiac trunk, and  moderate to severe luminal narrowing along the proximal superior mesenteric artery, both due to soft plaque. However, the celiac trunk and superior mesenteric artery remain patent. No definite evidence for bowel ischemia. 4. Apparent mild diffuse mucosal thickening along the distal transverse, descending and sigmoid colon, raising question for a mild infectious or inflammatory process; this is new from this morning. Trace surrounding soft tissue inflammation noted. Alternately, this could reflect reactive change secondary to contrast within the colon. 5. Diminutive bilateral renal arteries, with dense calcification at the origins of the renal arteries. Inferior mesenteric artery appears patent. Scattered calcification along the abdominal aorta and its branches. 6. Multiple low-attenuation lesions again noted throughout the spleen, relatively stable from 2016. 7. Scattered diverticulosis along the distal transverse, descending and sigmoid colon, without evidence of diverticulitis. 8. Chronic osseous fusion at L4-L5, with mild diffuse underlying degenerative change along the lumbar spine. Electronically Signed   By: Garald Balding M.D.   On: 11/01/2015 21:11    Medications:  Scheduled: . nystatin   Topical TID  . [START ON 11/04/2015] pantoprazole (PROTONIX) IV  40 mg Intravenous Q12H  . sodium chloride  3 mL Intravenous Q12H   Continuous: . pantoprozole (PROTONIX) infusion 8 mg/hr (11/02/15 0000)    Assessment/Plan: 1) Probable diverticular bleed. 2) Anemia.   The patient is stable at this time.  No issues with hypotension.  The CT angio was negative for any bleeding site, but there was a remark that the distal transverse colon and the left side of the colon exhibited some swelling.  This was new compared to the admission CT scan.  I do not feel that this is reflective of pathology in that she denies any further issues with bleeding and no abdominal pain to suggest ischemic colitis.  HGB is stable at  10.5 g/dL.  Plan: 1) Follow HGB. 2) Continue with supportive care.  LOS: 1 day   Makynlee Kressin D 11/02/2015, 7:21 AM

## 2015-11-02 NOTE — Progress Notes (Signed)
Patient's did not have any bowel movement or blood form rectum since admitted to unit. Foley placed as ordered with no difficulties.

## 2015-11-02 NOTE — Progress Notes (Signed)
OT Cancellation Note  Patient Details Name: ALINNA BURKHOLDER MRN: [id number] DOB: [birth year]   Cancelled Treatment:    Reason Eval/Treat Not Completed: Patient not medically ready. Per RN, pt on bed rest with foley placed last night due to tachycardia. Cardizem pill form ordered per MD and nursing states PT/OT to hold today. Will check back as appropriate and schedule allows. Thank you for the order.  Chrys Racer , MS, OTR/L, CLT Pager: [phone]  11/02/2015, 9:48 AM

## 2015-11-02 NOTE — Progress Notes (Signed)
Referring Physician(s): Dr Hung/ Dr Collene Mares  Chief Complaint:  GI Bleed  Subjective:  GI Bleed since 2pm 10/31/15 After many minimal episodes of bleeding per rectum; pt did become weak and dizzy and presented to ED 1/18 Admitted with hypotensive event No episode of bleeding since 330 pm 1/18 Hx CVA 2003---using Plavix and ASA daily Hx Afib also Dr Collene Mares contacted IR DR Laurence Ferrari in case need for mesenteric arteriogram and embolization Pt stabilized after 2 units blood No bleeding events in 20 hrs thus far Stable   Allergies: Codeine  Medications: Prior to Admission medications   Medication Sig Start Date End Date Taking? Authorizing Provider  aspirin 81 MG EC tablet Take 81 mg by mouth daily. Swallow whole.   Yes Historical Provider, MD  bisoprolol-hydrochlorothiazide (ZIAC) 2.5-6.25 MG per tablet Take 1 tablet by mouth daily.   Yes Historical Provider, MD  celecoxib (CELEBREX) 200 MG capsule Take 200 mg by mouth daily.   Yes Historical Provider, MD  clopidogrel (PLAVIX) 75 MG tablet Take 75 mg by mouth daily.   Yes Historical Provider, MD  enalapril (VASOTEC) 5 MG tablet Take 5 mg by mouth daily.   Yes Historical Provider, MD  furosemide (LASIX) 40 MG tablet Take 40 mg by mouth as directed. Take 1 tablet daily for 3 days with potassium, then off for 3 days, then repeat with potassium   Yes Historical Provider, MD  levothyroxine (SYNTHROID, LEVOTHROID) 175 MCG tablet Take 175 mcg by mouth daily before breakfast.   Yes Historical Provider, MD  potassium chloride SA (K-DUR,KLOR-CON) 20 MEQ tablet Take 40 mEq by mouth as directed. Take 1 tablets daily for 3 days, then off for 3 days, then repeat   Yes Historical Provider, MD  simvastatin (ZOCOR) 20 MG tablet Take 20 mg by mouth daily.   Yes Historical Provider, MD  traMADol (ULTRAM) 50 MG tablet Take 1 tablet (50 mg total) by mouth every 6 (six) hours as needed. Patient not taking: Reported on 11/01/2015 01/07/15   Hyman Bible,  PA-C     Vital Signs: BP 125/69 mmHg  Pulse 86  Temp(Src) 98.4 F (36.9 C) (Oral)  Resp 7  Ht 5\' 8"  (1.727 m)  Wt 172 lb 13.5 oz (78.4 kg)  BMI 26.29 kg/m2  SpO2 95%  Physical Exam  Constitutional: She is oriented to person, place, and time.  Cardiovascular: Normal rate and regular rhythm.   Pulmonary/Chest: Effort normal and breath sounds normal.  Abdominal: Soft. Bowel sounds are normal. There is tenderness.  Neurological: She is alert and oriented to person, place, and time.  Skin: Skin is warm.  Psychiatric: She has a normal mood and affect. Her behavior is normal. Judgment and thought content normal.  Nursing note and vitals reviewed.   Imaging: Ct Head Wo Contrast  11/01/2015  CLINICAL DATA:  Pain following fall EXAM: CT HEAD WITHOUT CONTRAST TECHNIQUE: Contiguous axial images were obtained from the base of the skull through the vertex without intravenous contrast. COMPARISON:  Brain MRI June 05, 2005 FINDINGS: Moderate diffuse atrophy is stable. There is a stable superior right frontal calcified meningioma without surrounding edema measuring 1.3 x 1.2 cm. There is a stable high left frontal calcified meningioma measuring 1.5 x 1.5 cm without surrounding edema. There is no new mass. There is no hemorrhage, extra-axial fluid collection, or midline shift. 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 This infarct extends to the posterior inferior left centrum semiovale, stable. There  is patchy small vessel disease in the centra semiovale bilaterally, stable. There is evidence of prior infarct in the superomedial left basal ganglia region. No acute infarct evident. Bony calvarium appears intact. The mastoid air cells are clear. No intraorbital lesions. There is a retention cyst in the anterior left sphenoid sinus region. IMPRESSION: Stable bilateral vertex meningiomas, calcified, without surrounding edema. There is atrophy with prior infarct at  the left frontal-parietal junction. There is small vessel disease in the centra semiovale bilaterally as well as prior infarct in the medial left basal ganglia region. No acute infarct evident. No hemorrhage. No edema. Small retention cyst left sphenoid sinus region. Electronically Signed   By: Lowella Grip III M.D.   On: 11/01/2015 09:35   Ct Abdomen Pelvis W Contrast  11/01/2015  CLINICAL DATA:  Fall this morning. Left lower quadrant pain. Rectal bleeding. Initial encounter. EXAM: CT ABDOMEN AND PELVIS WITH CONTRAST TECHNIQUE: Multidetector CT imaging of the abdomen and pelvis was performed using the standard protocol following bolus administration of intravenous contrast. CONTRAST:  152mL OMNIPAQUE IOHEXOL 300 MG/ML  SOLN COMPARISON:  01/07/2015 FINDINGS: Lower chest:  No acute findings. Hepatobiliary: No liver masses identified. Gallbladder is surgically absent. Mild dilatation of extrahepatic common bile duct is stable and likely related to prior cholecystectomy. Pancreas: No mass, inflammatory changes, or other significant abnormality. Spleen: No evidence of splenomegaly. Multiple low-attenuation lesions throughout the spleen show no significant change, with index lesion in the posterior splenic dome measuring 2.2 cm on image 19/series 201. Adrenals/Urinary Tract: No masses identified. No evidence of hydronephrosis. Stomach/Bowel: No evidence of obstruction, inflammatory process, or abnormal fluid collections. Severe diverticulosis again seen involving the descending and sigmoid colon, however there is no evidence of diverticulitis. Vascular/Lymphatic: No pathologically enlarged lymph nodes. No evidence of abdominal aortic aneurysm. Aortic atherosclerotic plaque noted. Reproductive: Prior hysterectomy noted. Adnexal regions are unremarkable in appearance. Other: No evidence of abdominal parenchymal organ injury or hemoperitoneum. Musculoskeletal: No suspicious bone lesions identified. No fractures  identified. Advanced lumbar spine degenerative changes noted. IMPRESSION: No evidence of visceral injury, hemoperitoneum, or other acute findings. Stable multiple nonspecific low-attenuation splenic lesions, most likely benign in etiology. No evidence of splenomegaly or lymphadenopathy. Colonic diverticulosis. No radiographic evidence of diverticulitis. Electronically Signed   By: Earle Gell M.D.   On: 11/01/2015 09:42   Dg Knee Complete 4 Views Right  11/01/2015  CLINICAL DATA:  Pain following fall EXAM: RIGHT KNEE - COMPLETE 4+ VIEW COMPARISON:  None. FINDINGS: Frontal, lateral, and bilateral oblique views were obtained. There is no demonstrable fracture or dislocation. There is a small joint effusion. There is moderate narrowing of the patellofemoral joint. There is spurring in all compartments. No erosive change. IMPRESSION: Small joint effusion. 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 No fracture or dislocation. Electronically Signed   By: Lowella Grip III M.D.   On: 11/01/2015 08:32   Dg Foot Complete Left  11/01/2015  CLINICAL DATA:  Pain following fall EXAM: LEFT FOOT - COMPLETE 3+ VIEW COMPARISON:  None. FINDINGS: Frontal, oblique, and lateral views obtained. There is no apparent fracture or dislocation. There is narrowing of all MTP, PIP, and DIP joints. There is a cystic area in the lateral proximal fourth metatarsal which appears benign. There is spurring in the dorsal midfoot. There are posterior and inferior calcaneal spurs. There is mild pes cavus. IMPRESSION: Multilevel osteoarthritic change. Calcaneal spurs. Benign appearing cystic area in the lateral proximal fourth metatarsal. No acute fracture or dislocation. Mild pes cavus.  Electronically Signed   By: Lowella Grip III M.D.   On: 11/01/2015 08:35   Ct Angio Abd/pel W/ And/or W/o  11/01/2015  CLINICAL DATA:  Acute onset of bright red blood per rectum. Generalized weakness and dizziness. Syncope and fall,  with hypotension. Left lower quadrant abdominal pain. Initial encounter. EXAM: CTA ABDOMEN AND PELVIS wITHOUT AND WITH CONTRAST TECHNIQUE: Multidetector CT imaging of the abdomen and pelvis was performed using the standard protocol during bolus administration of intravenous contrast. Multiplanar reconstructed images and MIPs were obtained and reviewed to evaluate the vascular anatomy. CONTRAST:  189mL OMNIPAQUE IOHEXOL 300 MG/ML  SOLN COMPARISON:  CT of the abdomen and pelvis from 01/07/2015, and recent CT of the abdomen and pelvis performed earlier today at 9:19 a.m. FINDINGS: The visualized lung bases are clear. There is no evidence of aortic dissection. There is no evidence of aneurysmal dilatation. Note is made of relatively severe luminal narrowing along the proximal celiac trunk, and moderate to severe luminal narrowing along the proximal superior mesenteric artery, both due to soft plaque. However, the celiac trunk and superior mesenteric artery remain patent. The inferior mesenteric artery also appears patent. Note is made of diminutive bilateral renal arteries, with dense calcification at the origins of the renal arteries; two right-sided renal arteries are seen. Scattered calcification is noted along the abdominal aorta and its branches. The inferior vena cava is unremarkable in appearance. The liver is unremarkable in appearance. Multiple low-attenuation lesions are again noted throughout the spleen, relatively stable from 2016. The patient is status post cholecystectomy, with clips noted at the gallbladder fossa. The pancreas and adrenal glands are unremarkable. The kidneys are unremarkable in appearance. There is no evidence of hydronephrosis. No renal or ureteral stones are seen. Minimal right-sided perinephric stranding is noted. No free fluid is identified. The small bowel is unremarkable in appearance. The stomach is within normal limits. No acute vascular abnormalities are seen. The patient is  status post appendectomy. Residual contrast is noted within much of the colon, limiting evaluation for intraluminal hemorrhage. There does appear to be new mild diffuse mucosal thickening along the distal transverse, descending and sigmoid colon, raising question for a mild infectious or inflammatory process. Trace surrounding soft tissue inflammation is noted. Scattered diverticulosis is noted along the distal transverse, descending and sigmoid colon, without evidence of diverticulitis. The bladder is mildly distended and filled with contrast. The patient is status post hysterectomy. No suspicious adnexal masses are seen. No inguinal lymphadenopathy is seen. No acute osseous abnormalities are identified. There is chronic osseous fusion at L4-L5. Multilevel vacuum phenomenon is noted along the lumbar spine. Review of the MIP images confirms the above findings. IMPRESSION: 1. No evidence of aortic dissection. No evidence of aneurysmal dilatation. 2. Evaluation for intraluminal hemorrhage is markedly suboptimal given residual contrast within the colon. 3. Relatively severe luminal narrowing along the proximal celiac trunk, and moderate to severe luminal narrowing along the proximal superior mesenteric artery, both due to soft plaque. However, the celiac trunk and superior mesenteric artery remain patent. No definite evidence for bowel ischemia. 4. Apparent mild diffuse mucosal thickening along the distal transverse, descending and sigmoid colon, raising question for a mild infectious or inflammatory process; this is new from this morning. Trace surrounding soft tissue inflammation noted. Alternately, this could reflect reactive change secondary to contrast within the colon. 5. Diminutive bilateral renal arteries, with dense calcification at the origins of the renal arteries. Inferior mesenteric artery appears patent. Scattered calcification along the abdominal aorta  and its branches. 6. Multiple low-attenuation  lesions again noted throughout the spleen, relatively stable from 2016. 7. Scattered diverticulosis along the distal transverse, descending and sigmoid colon, without evidence of diverticulitis. 8. Chronic osseous fusion at L4-L5, with mild diffuse underlying degenerative change along the lumbar spine. Electronically Signed   By: Garald Balding M.D.   On: 11/01/2015 21:11    Labs:  CBC:  Recent Labs  11/01/15 0651 11/01/15 1015 11/01/15 1703 11/01/15 2150 11/02/15 0558  WBC 8.7  --  11.0* 10.9* 7.4  HGB 11.5* 10.5* 11.6* 11.5* 10.5*  HCT 33.4* 31.3* 33.9* 33.7* 30.8*  PLT 188  --  171 181 174    COAGS: No results for input(s): INR, APTT in the last 8760 hours.  BMP:  Recent Labs  01/07/15 1537 11/01/15 0651 11/02/15 0558  NA 139 143 143  K 3.0* 3.9 3.6  CL 99 106 113*  CO2 30 26 25   GLUCOSE 97 122* 105*  BUN 33* 27* 24*  CALCIUM 8.9 9.3 8.8*  CREATININE 1.08 0.95 0.93  GFRNONAA 45* 53* 54*  GFRAA 53* >60 >60    LIVER FUNCTION TESTS:  Recent Labs  01/07/15 1537 11/01/15 0651  BILITOT 0.7 0.4  AST 18 17  ALT 14 9*  ALKPHOS 105 72  PROT 6.4 5.4*  ALBUMIN 3.6 2.9*    Assessment and Plan:  GI bleed No event since 330 pm yesterday Stable VS Stable h/h Plan to hold on IR involvement at this point If further bleeding occurs; would recommend: tagged red blood cell NM study for localization Introduced myself to pt and family; they know we are aware of patient   Electronically Signed: Sena Hoopingarner A 11/02/2015, 11:01 AM   I spent a total of 15 Minutes at the the patient's bedside AND on the patient's hospital floor or unit, greater than 50% of which was counseling/coordinating care for gi bleed

## 2015-11-03 DIAGNOSIS — K922 Gastrointestinal hemorrhage, unspecified: Secondary | ICD-10-CM

## 2015-11-03 DIAGNOSIS — I9589 Other hypotension: Secondary | ICD-10-CM

## 2015-11-03 DIAGNOSIS — W19XXXA Unspecified fall, initial encounter: Secondary | ICD-10-CM

## 2015-11-03 DIAGNOSIS — K625 Hemorrhage of anus and rectum: Principal | ICD-10-CM | POA: Diagnosis present

## 2015-11-03 LAB — HEMOGLOBIN AND HEMATOCRIT, BLOOD
HEMATOCRIT: 27.8 % — AB (ref 36.0–46.0)
HEMOGLOBIN: 9.2 g/dL — AB (ref 12.0–15.0)

## 2015-11-03 NOTE — Progress Notes (Signed)
[facility] PROGRESS NOTE   JOSELINE SMITS [id number] DOB: Mar 08, 1927 DOA: 11/01/2015 PCP: Geoffery Lyons, MD  HPI/Subjective: No bowel movements since the 18th, no evidence of bleeding. Denies any chest pain or shortness of breath.  Assessment/Plan: Principal Problem:   GI bleed Active Problems:   Fall   Physical deconditioning   Atrial fibrillation (HCC)   Hypotension   History of CVA (cerebrovascular accident)   Acute blood loss anemia   GI bleed Patient presented with bright red blood per rectum prior to admission. No bleeding since 18th. Bleeding scan done and showed no evidence of bleeding. GI following, family is wondering if colonoscopy will be done. CT showed mucosal thickening in the transverse and descending colon. On clear liquids.  Atrial fibrillation Has atrial fibrillation, developed rapid ventricular response probably secondary to bleeding/hypovolemia. Started on Cardizem by cardiology. Better rate control, CHA2DS2-VASc of 7, was on aspirin and Plavix both held. Per cardiology needs anticoagulation, will not start until instructed by GI.  Hypertension Secondary to bleeding and hypovolemia, this is resolved after IV fluid hydration.  History of CVA This is chronic, was on aspirin and Plavix likely she will need DOAC.  Physical deconditioning Lives alone at home, recent loss of her husband. Had previous stroke and right-sided hemiparesis, suspect recommendation for a SNF versus home health   Code Status: Full Code Family Communication: Plan discussed with the patient. Disposition Plan: Remains inpatient Diet: Diet clear liquid Room service appropriate?: Yes; Fluid consistency:: Thin  Consultants:  GI  Cardiology  Procedures:  Bleeding scan  Antibiotics:  None   Objective: Filed Vitals:   11/03/15 0829 11/03/15 1000  BP: 125/51 109/66  Pulse:  98  Temp:    Resp:  23    Intake/Output Summary (Last 24 hours) at  11/03/15 1041 Last data filed at 11/03/15 0952  Gross per 24 hour  Intake    360 ml  Output   1575 ml  Net  -1215 ml   Filed Weights   11/01/15 0552 11/01/15 2020  Weight: 74.844 kg (165 lb) 78.4 kg (172 lb 13.5 oz)    Exam: General: Alert and awake, oriented x3, not in any acute distress. HEENT: anicteric sclera, pupils reactive to light and accommodation, EOMI CVS: S1-S2 clear, no murmur rubs or gallops Chest: clear to auscultation bilaterally, no wheezing, rales or rhonchi Abdomen: soft nontender, nondistended, normal bowel sounds, no organomegaly Extremities: no cyanosis, clubbing or edema noted bilaterally Neuro: Cranial nerves II-XII intact, no focal neurological deficits  Data Reviewed: Basic Metabolic Panel:  Recent Labs Lab 11/01/15 0651 11/01/15 1015 11/02/15 0558  NA 143  --  143  K 3.9  --  3.6  CL 106  --  113*  CO2 26  --  25  GLUCOSE 122*  --  105*  BUN 27*  --  24*  CREATININE 0.95  --  0.93  CALCIUM 9.3  --  8.8*  MG  --  1.7  --    Liver Function Tests:  Recent Labs Lab 11/01/15 0651  AST 17  ALT 9*  ALKPHOS 72  BILITOT 0.4  PROT 5.4*  ALBUMIN 2.9*   No results for input(s): LIPASE, AMYLASE in the last 168 hours. No results for input(s): AMMONIA in the last 168 hours. CBC:  Recent Labs Lab 11/01/15 0651  11/01/15 1703 11/01/15 2150 11/02/15 0558 11/02/15 1244 11/03/15 0242  WBC 8.7  --  11.0* 10.9* 7.4 7.4  --   NEUTROABS 6.7  --   --   --   --   --   --  HGB 11.5*  < > 11.6* 11.5* 10.5* 10.0* 9.2*  HCT 33.4*  < > 33.9* 33.7* 30.8* 30.0* 27.8*  MCV 93.3  --  92.6 91.1 90.6 92.6  --   PLT 188  --  171 181 174 170  --   < > = values in this interval not displayed. Cardiac Enzymes:  Recent Labs Lab 11/01/15 0651  TROPONINI <0.03   BNP (last 3 results) No results for input(s): BNP in the last 8760 hours.  ProBNP (last 3 results) No results for input(s): PROBNP in the last 8760 hours.  CBG:  Recent Labs Lab  11/01/15 2230  GLUCAP 122*    Micro Recent Results (from the past 240 hour(s))  MRSA PCR Screening     Status: None   Collection Time: 11/01/15  8:51 PM  Result Value Ref Range Status   MRSA by PCR NEGATIVE NEGATIVE Final    Comment:        The GeneXpert MRSA Assay (FDA approved for NASAL specimens only), is one component of a comprehensive MRSA colonization surveillance program. It is not intended to diagnose MRSA infection nor to guide or monitor treatment for MRSA infections.      Studies: Ct Angio Abd/pel W/ And/or W/o  11/01/2015  CLINICAL DATA:  Acute onset of bright red blood per rectum. Generalized weakness and dizziness. Syncope and fall, with hypotension. Left lower quadrant abdominal pain. Initial encounter. EXAM: CTA ABDOMEN AND PELVIS wITHOUT AND WITH CONTRAST TECHNIQUE: Multidetector CT imaging of the abdomen and pelvis was performed using the standard protocol during bolus administration of intravenous contrast. Multiplanar reconstructed images and MIPs were obtained and reviewed to evaluate the vascular anatomy. CONTRAST:  167mL OMNIPAQUE IOHEXOL 300 MG/ML  SOLN COMPARISON:  CT of the abdomen and pelvis from 01/07/2015, and recent CT of the abdomen and pelvis performed earlier today at 9:19 a.m. FINDINGS: The visualized lung bases are clear. There is no evidence of aortic dissection. There is no evidence of aneurysmal dilatation. Note is made of relatively severe luminal narrowing along the proximal celiac trunk, and moderate to severe luminal narrowing along the proximal superior mesenteric artery, both due to soft plaque. However, the celiac trunk and superior mesenteric artery remain patent. The inferior mesenteric artery also appears patent. Note is made of diminutive bilateral renal arteries, with dense calcification at the origins of the renal arteries; two right-sided renal arteries are seen. Scattered calcification is noted along the abdominal aorta and its branches.  The inferior vena cava is unremarkable in appearance. The liver is unremarkable in appearance. Multiple low-attenuation lesions are again noted throughout the spleen, relatively stable from 2016. The patient is status post cholecystectomy, with clips noted at the gallbladder fossa. The pancreas and adrenal glands are unremarkable. The kidneys are unremarkable in appearance. There is no evidence of hydronephrosis. No renal or ureteral stones are seen. Minimal right-sided perinephric stranding is noted. No free fluid is identified. The small bowel is unremarkable in appearance. The stomach is within normal limits. No acute vascular abnormalities are seen. The patient is status post appendectomy. Residual contrast is noted within much of the colon, limiting evaluation for intraluminal hemorrhage. There does appear to be new mild diffuse mucosal thickening along the distal transverse, descending and sigmoid colon, raising question for a mild infectious or inflammatory process. Trace surrounding soft tissue inflammation is noted. Scattered diverticulosis is noted along the distal transverse, descending and sigmoid colon, without evidence of diverticulitis. The bladder is mildly distended and filled with contrast.  The patient is status post hysterectomy. No suspicious adnexal masses are seen. No inguinal lymphadenopathy is seen. No acute osseous abnormalities are identified. There is chronic osseous fusion at L4-L5. Multilevel vacuum phenomenon is noted along the lumbar spine. Review of the MIP images confirms the above findings. IMPRESSION: 1. No evidence of aortic dissection. No evidence of aneurysmal dilatation. 2. Evaluation for intraluminal hemorrhage is markedly suboptimal given residual contrast within the colon. 3. Relatively severe luminal narrowing along the proximal celiac trunk, and moderate to severe luminal narrowing along the proximal superior mesenteric artery, both due to soft plaque. However, the celiac  trunk and superior mesenteric artery remain patent. No definite evidence for bowel ischemia. 4. Apparent mild diffuse mucosal thickening along the distal transverse, descending and sigmoid colon, raising question for a mild infectious or inflammatory process; this is new from this morning. Trace surrounding soft tissue inflammation noted. Alternately, this could reflect reactive change secondary to contrast within the colon. 5. Diminutive bilateral renal arteries, with dense calcification at the origins of the renal arteries. Inferior mesenteric artery appears patent. Scattered calcification along the abdominal aorta and its branches. 6. Multiple low-attenuation lesions again noted throughout the spleen, relatively stable from 2016. 7. Scattered diverticulosis along the distal transverse, descending and sigmoid colon, without evidence of diverticulitis. 8. Chronic osseous fusion at L4-L5, with mild diffuse underlying degenerative change along the lumbar spine. Electronically Signed   By: Garald Balding M.D.   On: 11/01/2015 21:11    Scheduled Meds: . diltiazem  30 mg Oral 3 times per day  . nystatin   Topical TID  . sodium chloride  3 mL Intravenous Q12H   Continuous Infusions:      Time spent: 35 minutes    [facility] A  [facility] Pager [phone] If 7PM-7AM, please contact night-coverage at [email or web address], password [facility] 11/03/2015, 10:41 AM  LOS: 2 days

## 2015-11-03 NOTE — Progress Notes (Signed)
Chaplain presented to the patient who was awake and alert at the time of this visit.  Chaplain introduced self and inquired  of any needs or concerns she need spiritual support for at this time.  She request a return visit because she received a phone from a family friend from [place].  Chaplain will follow up at another time. Chaplain Yaakov Guthrie [phone]

## 2015-11-03 NOTE — Evaluation (Signed)
Occupational Therapy Evaluation Patient Details Name: Jillian Cantrell MRN: [id number] DOB: 07/01/1927 Today's Date: 11/03/2015    History of Present Illness 82 yo female s/p fall at home with 9th rib fx and GIB. PMH: R hemiplegia, CVA, HTN, thyroid cancer   Clinical Impression   PT admitted s/p fall with GIB and 9th rib fx.. Pt currently with functional limitiations due to the deficits listed below (see OT problem list). PTA patient was from home with family assistance.  Pt will benefit from skilled OT to increase their independence and safety with adls and balance to allow discharge SNF. Pt currently requires incr time due to HR elevated with all mobility. Pt participating and eager to get better. Ot to follow acutely for balance, basic transfer and adls.      Follow Up Recommendations  SNF;Supervision/Assistance - 24 hour    Equipment Recommendations  Other (comment) (defer SNF)    Recommendations for Other Services       Precautions / Restrictions Precautions Precautions: Fall Precaution Comments: watch HR - Rn requesting HR remain 120 or less Restrictions Weight Bearing Restrictions: No      Mobility Bed Mobility Overal bed mobility: Needs Assistance Bed Mobility: Supine to Sit     Supine to sit: Max assist     General bed mobility comments: Pt reaching for therapist with L UE and activating core toward EOB. pt needs (A) to bring trunk up into static long sitting. pt required pad to scoot bil hips to EOB.   Transfers Overall transfer level: Needs assistance Equipment used: 2 person hand held assist Transfers: Stand Pivot Transfers   Stand pivot transfers: +2 physical assistance;Mod assist       General transfer comment: transfer to the L and pt stepping with L toward chair. pt needed (A) to help complete transfer with incr HR    Balance Overall balance assessment: Needs assistance Sitting-balance support: Single extremity supported;Feet  supported Sitting balance-Leahy Scale: Fair     Standing balance support: Single extremity supported;During functional activity Standing balance-Leahy Scale: Poor                              ADL Overall ADL's : Needs assistance/impaired     Grooming: Wash/dry face;Minimal assistance;Bed level Grooming Details (indicate cue type and reason): using L Ue and (A) to doff glasses Upper Body Bathing: Moderate assistance;Bed level   Lower Body Bathing: Bed level;Total assistance   Upper Body Dressing : Moderate assistance;Bed level       Toilet Transfer: +2 for physical assistance;Moderate assistance Toilet Transfer Details (indicate cue type and reason): simulated with OOB to chair with HR incr 153 and immediately with sitting return to 122- 117           General ADL Comments: Session with HR montiored pt resting on arrival 114-117. Pt bathing supine with hr 120- 111. pt sitting eob with HR incr 130 and with rest break for 3 minutes below 120. Pt stand pivot to chair with HR incr 153 and immediately with sitting return to 122. Pt with BIL LE elevated in chair HR 109 and BP 129/74. Pt denies any pain or discomfort.      Vision     Perception     Praxis      Pertinent Vitals/Pain Pain Assessment: No/denies pain     Hand Dominance Left (due to R hemiplegia)   Extremity/Trunk Assessment Upper Extremity Assessment Upper Extremity Assessment: RUE  deficits/detail RUE Deficits / Details: tone noted and hand contracture. pt with a flexed wrist , flexed digits adn adducted shoulder. Pt able to AAROM shoulder into 30 degrees abduction for bathing, elbow extension to 160 degrees, AROM elbow noted. Pt with digits adducted and at risk for skin break down.  RUE Sensation: decreased light touch RUE Coordination: decreased fine motor;decreased gross motor       Cervical / Trunk Assessment Cervical / Trunk Assessment: Kyphotic   Communication  Communication Communication: No difficulties (delayed response)   Cognition Arousal/Alertness: Awake/alert Behavior During Therapy: WFL for tasks assessed/performed Overall Cognitive Status: Within Functional Limits for tasks assessed                     General Comments       Exercises       Shoulder Instructions      Home Living Family/patient expects to be discharged to:: Skilled nursing facility                                 Additional Comments: PTA was living alone and experienced a fall. Son at bedside at this time      Prior Functioning/Environment Level of Independence: Independent with assistive device(s)        Comments: Son reports at baseline needing (A) for bathing/ dressing    OT Diagnosis: Generalized weakness   OT Problem List: Decreased strength;Decreased activity tolerance;Impaired balance (sitting and/or standing);Decreased safety awareness;Decreased knowledge of use of DME or AE;Decreased knowledge of precautions;Impaired UE functional use;Impaired tone   OT Treatment/Interventions: Self-care/ADL training;Therapeutic exercise;DME and/or AE instruction;Therapeutic activities;Patient/family education;Balance training    OT Goals(Current goals can be found in the care plan section) Acute Rehab OT Goals Patient Stated Goal: none stated. Reports feels better after a bath and up in chair OT Goal Formulation: With patient Time For Goal Achievement: 11/17/15 Potential to Achieve Goals: Good  OT Frequency: Min 2X/week   Barriers to D/C:            Co-evaluation              End of Session Equipment Utilized During Treatment: Gait belt Nurse Communication: Mobility status;Precautions  Activity Tolerance: Patient tolerated treatment well Patient left: in chair;with call bell/phone within reach;with family/visitor present   Time: [id number] OT Time Calculation (min): 24 min Charges:  OT General Charges $OT Visit: 1  Procedure OT Evaluation $OT Eval High Complexity: 1 Procedure G-Codes:    Parke Poisson B 11-16-15, 11:07 AM   Jeri Modena   OTR/L PagerOH:[phone] Office: [phone] .

## 2015-11-03 NOTE — Evaluation (Signed)
Physical Therapy Evaluation Patient Details Name: Jillian Cantrell MRN: [id number] DOB: 10-03-1927 Today's Date: 11/03/2015   History of Present Illness  82 yo female s/p fall at home with 9th rib fx and GIB. PMH: R hemiplegia, CVA, HTN, thyroid cancer  Clinical Impression   Patient demonstrates deficits in functional mobility as indicated below. Will need continued skilled PT to address deficits and maximize function. Will see as indicated and progress as tolerated.  OF NOTE: session limited by elevation in HR Afib 156 at peak, monitored and returned to 110s with rest. Spoke with son who is in agreement for ST SNF upon acute discharge (prefer [facility] [place])    Follow Up Recommendations SNF;Supervision/Assistance - 24 hour (prefer [facility] [place])    Equipment Recommendations   (TBD)    Recommendations for Other Services       Precautions / Restrictions Precautions Precautions: Fall Precaution Comments: watch HR - Rn requesting HR remain 120 or less Restrictions Weight Bearing Restrictions: No      Mobility  Bed Mobility               General bed mobility comments: patient received in chair   Transfers Overall transfer level: Needs assistance Equipment used: 2 person hand held assist Transfers: Sit to/from Stand Sit to Stand: +2 physical assistance;Mod assist         General transfer comment: Assist to power up to standing, patient was able to initiate elevation. Elevated HR with activity up to 156 in standing, patient very anxious in presentation. Heavy reliance on LUE support, denied pain during standing trial  Ambulation/Gait             General Gait Details: attempted but could not tolerate due to HR elevation and anxiety  Stairs            Wheelchair Mobility    Modified Rankin (Stroke Patients Only)       Balance Overall balance assessment: Needs assistance Sitting-balance support: Single extremity  supported Sitting balance-Leahy Scale: Fair     Standing balance support: Single extremity supported Standing balance-Leahy Scale: Poor                               Pertinent Vitals/Pain Pain Assessment: No/denies pain    Home Living Family/patient expects to be discharged to:: Skilled nursing facility                 Additional Comments: Prior to admission lived alone with caregiver assist in the mornings    Prior Function Level of Independence: Independent with assistive device(s) (quad cane)         Comments: Son reports at baseline needing (A) for bathing/ dressing     Hand Dominance   Dominant Hand: Left (due to R hemiplegia)    Extremity/Trunk Assessment   Upper Extremity Assessment: Defer to OT evaluation           Lower Extremity Assessment: Generalized weakness;RLE deficits/detail RLE Deficits / Details: decreased LE strength, 3/5 strength grossly     Cervical / Trunk Assessment: Kyphotic  Communication   Communication: No difficulties (delayed response)  Cognition Arousal/Alertness: Awake/alert Behavior During Therapy: Anxious;Flat affect Overall Cognitive Status: Within Functional Limits for tasks assessed                      General Comments      Exercises Other Exercises Other Exercises: performed  LE AROM ankle pumps in recliner, attempted SLR but could not elevate RLE more then 2 inches from recliner surface x3. Performed weight shifts and lateral leans in standing with increased physical assist.      Assessment/Plan    PT Assessment Patient needs continued PT services  PT Diagnosis Difficulty walking;Abnormality of gait;Generalized weakness;Acute pain   PT Problem List Decreased strength;Decreased activity tolerance;Decreased balance;Decreased mobility;Decreased coordination;Decreased knowledge of use of DME  PT Treatment Interventions DME instruction;Gait training;Functional mobility training;Therapeutic  activities;Therapeutic exercise;Balance training;Patient/family education   PT Goals (Current goals can be found in the Care Plan section) Acute Rehab PT Goals Patient Stated Goal: to get some rehab and return home PT Goal Formulation: With patient/family Time For Goal Achievement: 11/17/15 Potential to Achieve Goals: Good    Frequency Min 2X/week   Barriers to discharge        Co-evaluation               End of Session Equipment Utilized During Treatment: Gait belt Activity Tolerance: Treatment limited secondary to medical complications (Comment) (elevated HR 150s with minimal activity) Patient left: in chair;with call bell/phone within reach;with bed alarm set;with family/visitor present Nurse Communication: Mobility status         Time: [id number] PT Time Calculation (min) (ACUTE ONLY): 22 min   Charges:   PT Evaluation $PT Eval High Complexity: 1 Procedure     PT G CodesDuncan Dull November 10, 2015, 2:14 PM Alben Deeds, [place] DPT  [phone]

## 2015-11-03 NOTE — Progress Notes (Signed)
    SUBJECTIVE:  No complaints.  No SOB.  No pain   PHYSICAL EXAM Filed Vitals:   11/03/15 0200 11/03/15 0400 11/03/15 0800 11/03/15 0829  BP: 116/56 119/63 125/51 125/51  Pulse: 98 108 103   Temp:  98.1 F (36.7 C) 98 F (36.7 C)   TempSrc:  Oral Oral   Resp: 21 15 12    Height:      Weight:      SpO2:  94%     General:  No distress Lungs:  Clear Heart:  Irregular Abdomen:  Positive bowel sounds, no rebound no guarding Extremities:  No edema   LABS: Lab Results  Component Value Date   TROPONINI <0.03 11/01/2015   Results for orders placed or performed during the hospital encounter of 11/01/15 (from the past 24 hour(s))  BLOOD TRANSFUSION REPORT - SCANNED     Status: None   Collection Time: 11/02/15 11:20 AM   Narrative   Ordered by an unspecified provider.  CBC     Status: Abnormal   Collection Time: 11/02/15 12:44 PM  Result Value Ref Range   WBC 7.4 4.0 - 10.5 K/uL   RBC 3.24 (L) 3.87 - 5.11 MIL/uL   Hemoglobin 10.0 (L) 12.0 - 15.0 g/dL   HCT 30.0 (L) 36.0 - 46.0 %   MCV 92.6 78.0 - 100.0 fL   MCH 30.9 26.0 - 34.0 pg   MCHC 33.3 30.0 - 36.0 g/dL   RDW 14.7 11.5 - 15.5 %   Platelets 170 150 - 400 K/uL  Hemoglobin and hematocrit, blood     Status: Abnormal   Collection Time: 11/03/15  2:42 AM  Result Value Ref Range   Hemoglobin 9.2 (L) 12.0 - 15.0 g/dL   HCT 27.8 (L) 36.0 - 46.0 %    Intake/Output Summary (Last 24 hours) at 11/03/15 0901 Last data filed at 11/03/15 0600  Gross per 24 hour  Intake    120 ml  Output   1575 ml  Net  -1455 ml   ECHO:  Left ventricle: The cavity size was normal. Wall thickness was increased in a pattern of mild LVH. Systolic function was normal. The estimated ejection fraction was in the range of 55% to 60%. Wall motion was normal; there were no regional wall motion abnormalities. - Aortic valve: Valve mobility was restricted. - Mitral valve: Calcified annulus. Mildly thickened leaflets . - Left atrium: The  atrium was mildly dilated. - Right atrium: The atrium was mildly dilated. - Pulmonary arteries: Systolic pressure was mildly increased. PA peak pressure: 38 mm Hg (S). - Pericardium, extracardiac: A trivial pericardial effusion was identified.   ASSESSMENT AND PLAN:  Atrial fib:  Rate has been elevated in the face of a GI bleed.   Change to Cardizem CD.   Echo as above. At some point in the future I will need to see sign off from a GI doctor before starting [place].    Jeneen Rinks [facility] 11/03/2015 9:01 AM

## 2015-11-03 NOTE — Care Management Important Message (Signed)
Important Message  Patient Details  Name: Jillian Cantrell MRN: [id number] Date of Birth: 08-13-1927   Medicare Important Message Given:  Yes    Nathen May 11/03/2015, 3:13 PM

## 2015-11-03 NOTE — Progress Notes (Signed)
Initial Nutrition Assessment  DOCUMENTATION CODES:   Not applicable  INTERVENTION:   No nutrition intervention at this time --- patient declined  NUTRITION DIAGNOSIS:   Increased nutrient needs related to acute illness as evidenced by estimated needs  GOAL:   Patient will meet greater than or equal to 90% of their needs  MONITOR:   PO intake, Supplement acceptance, Labs, Weight trends, I & O's  REASON FOR ASSESSMENT:   Consult Assessment of nutrition requirement/status  ASSESSMENT:   82 yo Female with PMH of stroke; admitted s/p fall and bloody stools.  Patient reports her appetite is OK.  Typically consumes 2-3 meals per day.  She did reveal she los her husband approximately 6 weeks ago.  Declined addition of oral nutrition supplements.  Nutrition focused physical exam completed.  No muscle or subcutaneous fat depletion noticed.  Diet Order:  Diet clear liquid Room service appropriate?: Yes; Fluid consistency:: Thin  Skin:  Reviewed, no issues  Last BM:  1/18  Height:   Ht Readings from Last 1 Encounters:  11/01/15 5\' 8"  (1.727 m)    Weight:   Wt Readings from Last 1 Encounters:  11/01/15 172 lb 13.5 oz (78.4 kg)    Ideal Body Weight:  63.6 kg  BMI:  Body mass index is 26.29 kg/(m^2).  Estimated Nutritional Needs:   Kcal:  1800-2000  Protein:  90-100 gm  Fluid:  1.8-2.0 L  EDUCATION NEEDS:   No education needs identified at this time  Arthur Holms, RD, LDN Pager #: [phone] After-Hours Pager #: [phone]

## 2015-11-04 DIAGNOSIS — D62 Acute posthemorrhagic anemia: Secondary | ICD-10-CM

## 2015-11-04 LAB — URINALYSIS, ROUTINE W REFLEX MICROSCOPIC
Bilirubin Urine: NEGATIVE
Glucose, UA: NEGATIVE mg/dL
Ketones, ur: NEGATIVE mg/dL
NITRITE: POSITIVE — AB
PH: 5 (ref 5.0–8.0)
Protein, ur: NEGATIVE mg/dL
SPECIFIC GRAVITY, URINE: 1.015 (ref 1.005–1.030)

## 2015-11-04 LAB — CBC
HEMATOCRIT: 24.9 % — AB (ref 36.0–46.0)
HEMOGLOBIN: 8.6 g/dL — AB (ref 12.0–15.0)
MCH: 32 pg (ref 26.0–34.0)
MCHC: 34.5 g/dL (ref 30.0–36.0)
MCV: 92.6 fL (ref 78.0–100.0)
Platelets: 166 10*3/uL (ref 150–400)
RBC: 2.69 MIL/uL — ABNORMAL LOW (ref 3.87–5.11)
RDW: 14.2 % (ref 11.5–15.5)
WBC: 8.5 10*3/uL (ref 4.0–10.5)

## 2015-11-04 LAB — URINE MICROSCOPIC-ADD ON

## 2015-11-04 LAB — BASIC METABOLIC PANEL
ANION GAP: 8 (ref 5–15)
BUN: 15 mg/dL (ref 6–20)
CO2: 24 mmol/L (ref 22–32)
Calcium: 8.4 mg/dL — ABNORMAL LOW (ref 8.9–10.3)
Chloride: 107 mmol/L (ref 101–111)
Creatinine, Ser: 0.83 mg/dL (ref 0.44–1.00)
GFR calc Af Amer: >60 mL/min (ref >60)
GLUCOSE: 102 mg/dL — AB (ref 65–99)
POTASSIUM: 3.3 mmol/L — AB (ref 3.5–5.1)
SODIUM: 139 mmol/L (ref 135–145)

## 2015-11-04 MED ORDER — POTASSIUM CHLORIDE CRYS ER 20 MEQ PO TBCR
40.0000 meq | EXTENDED_RELEASE_TABLET | Freq: Four times a day (QID) | ORAL | Status: AC
Start: 2015-11-04 — End: 2015-11-04
  Administered 2015-11-04 (×2): 40 meq via ORAL
  Filled 2015-11-04 (×2): qty 2

## 2015-11-04 MED ORDER — MAGNESIUM SULFATE 2 GM/50ML IV SOLN
2.0000 g | Freq: Once | INTRAVENOUS | Status: AC
  Administered 2015-11-04: 2 g via INTRAVENOUS
  Filled 2015-11-04: qty 50

## 2015-11-04 MED ORDER — LEVOTHYROXINE SODIUM 75 MCG PO TABS
150.0000 ug | ORAL_TABLET | Freq: Every day | ORAL | Status: DC
  Administered 2015-11-04 – 2015-11-07 (×4): 150 ug via ORAL
  Filled 2015-11-04 (×4): qty 2

## 2015-11-04 MED ORDER — DILTIAZEM HCL 60 MG PO TABS
60.0000 mg | ORAL_TABLET | Freq: Three times a day (TID) | ORAL | Status: DC
  Administered 2015-11-04 – 2015-11-06 (×6): 60 mg via ORAL
  Filled 2015-11-04 (×6): qty 1

## 2015-11-04 NOTE — Progress Notes (Signed)
SUBJECTIVE:  No palpitations.  Feels weak.  OBJECTIVE:   Vitals:   Filed Vitals:   11/04/15 0800 11/04/15 0822 11/04/15 1000 11/04/15 1205  BP: 120/59 120/59 135/91 128/50  Pulse: 112 112 102 90  Temp:  98.8 F (37.1 C)    TempSrc:  Oral    Resp: 25 17 23    Height:      Weight:      SpO2:  93%  93%   I&O's:   Intake/Output Summary (Last 24 hours) at 11/04/15 1329 Last data filed at 11/04/15 1028  Gross per 24 hour  Intake    600 ml  Output   1050 ml  Net   -450 ml   TELEMETRY: Reviewed telemetry pt in rate controlled AFib, HR increases with activity.:     PHYSICAL EXAM General: Well developed, well nourished, in no acute distress Head:   Normal cephalic and atramatic  Lungs: Clear bilaterally to auscultation. Heart:  Irregularly irregular S1 S2  No JVD.   Abdomen: abdomen soft and non-tender Msk:  Back normal,  Normal strength and tone for age. Extremities:   No edema.   Neuro: Alert and oriented. Psych:  Normal affect, responds appropriately Skin: No rash   LABS: Basic Metabolic Panel:  Recent Labs  11/02/15 0558 11/04/15 0320  NA 143 139  K 3.6 3.3*  CL 113* 107  CO2 25 24  GLUCOSE 105* 102*  BUN 24* 15  CREATININE 0.93 0.83  CALCIUM 8.8* 8.4*   Liver Function Tests: No results for input(s): AST, ALT, ALKPHOS, BILITOT, PROT, ALBUMIN in the last 72 hours. No results for input(s): LIPASE, AMYLASE in the last 72 hours. CBC:  Recent Labs  11/02/15 1244 11/03/15 0242 11/04/15 0320  WBC 7.4  --  8.5  HGB 10.0* 9.2* 8.6*  HCT 30.0* 27.8* 24.9*  MCV 92.6  --  92.6  PLT 170  --  166   Cardiac Enzymes: No results for input(s): CKTOTAL, CKMB, CKMBINDEX, TROPONINI in the last 72 hours. BNP: Invalid input(s): POCBNP D-Dimer: No results for input(s): DDIMER in the last 72 hours. Hemoglobin A1C: No results for input(s): HGBA1C in the last 72 hours. Fasting Lipid Panel: No results for input(s): CHOL, HDL, LDLCALC, TRIG, CHOLHDL, LDLDIRECT in  the last 72 hours. Thyroid Function Tests: No results for input(s): TSH, T4TOTAL, T3FREE, THYROIDAB in the last 72 hours.  Invalid input(s): FREET3 Anemia Panel: No results for input(s): VITAMINB12, FOLATE, FERRITIN, TIBC, IRON, RETICCTPCT in the last 72 hours. Coag Panel:   No results found for: INR, PROTIME  RADIOLOGY: Ct Head Wo Contrast  11/01/2015  CLINICAL DATA:  Pain following fall EXAM: CT HEAD WITHOUT CONTRAST TECHNIQUE: Contiguous axial images were obtained from the base of the skull through the vertex without intravenous contrast. COMPARISON:  Brain MRI June 05, 2005 FINDINGS: Moderate diffuse atrophy is stable. There is a stable superior right frontal calcified meningioma without surrounding edema measuring 1.3 x 1.2 cm. There is a stable high left frontal calcified meningioma measuring 1.5 x 1.5 cm without surrounding edema. There is no new mass. There is no hemorrhage, extra-axial fluid collection, or midline shift. 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 This infarct extends to the posterior inferior left centrum semiovale, stable. There is patchy small vessel disease in the centra semiovale bilaterally, stable. There is evidence of prior infarct in the superomedial left basal ganglia region. No acute infarct evident. Bony calvarium appears intact. The mastoid air  cells are clear. No intraorbital lesions. There is a retention cyst in the anterior left sphenoid sinus region. IMPRESSION: Stable bilateral vertex meningiomas, calcified, without surrounding edema. There is atrophy with prior infarct at the left frontal-parietal junction. There is small vessel disease in the centra semiovale bilaterally as well as prior infarct in the medial left basal ganglia region. No acute infarct evident. No hemorrhage. No edema. Small retention cyst left sphenoid sinus region. Electronically Signed   By: Lowella Grip III M.D.   On: 11/01/2015 09:35   Ct  Abdomen Pelvis W Contrast  11/01/2015  CLINICAL DATA:  Fall this morning. Left lower quadrant pain. Rectal bleeding. Initial encounter. EXAM: CT ABDOMEN AND PELVIS WITH CONTRAST TECHNIQUE: Multidetector CT imaging of the abdomen and pelvis was performed using the standard protocol following bolus administration of intravenous contrast. CONTRAST:  170mL OMNIPAQUE IOHEXOL 300 MG/ML  SOLN COMPARISON:  01/07/2015 FINDINGS: Lower chest:  No acute findings. Hepatobiliary: No liver masses identified. Gallbladder is surgically absent. Mild dilatation of extrahepatic common bile duct is stable and likely related to prior cholecystectomy. Pancreas: No mass, inflammatory changes, or other significant abnormality. Spleen: No evidence of splenomegaly. Multiple low-attenuation lesions throughout the spleen show no significant change, with index lesion in the posterior splenic dome measuring 2.2 cm on image 19/series 201. Adrenals/Urinary Tract: No masses identified. No evidence of hydronephrosis. Stomach/Bowel: No evidence of obstruction, inflammatory process, or abnormal fluid collections. Severe diverticulosis again seen involving the descending and sigmoid colon, however there is no evidence of diverticulitis. Vascular/Lymphatic: No pathologically enlarged lymph nodes. No evidence of abdominal aortic aneurysm. Aortic atherosclerotic plaque noted. Reproductive: Prior hysterectomy noted. Adnexal regions are unremarkable in appearance. Other: No evidence of abdominal parenchymal organ injury or hemoperitoneum. Musculoskeletal: No suspicious bone lesions identified. No fractures identified. Advanced lumbar spine degenerative changes noted. IMPRESSION: No evidence of visceral injury, hemoperitoneum, or other acute findings. Stable multiple nonspecific low-attenuation splenic lesions, most likely benign in etiology. No evidence of splenomegaly or lymphadenopathy. Colonic diverticulosis. No radiographic evidence of diverticulitis.  Electronically Signed   By: Earle Gell M.D.   On: 11/01/2015 09:42   Dg Knee Complete 4 Views Right  11/01/2015  CLINICAL DATA:  Pain following fall EXAM: RIGHT KNEE - COMPLETE 4+ VIEW COMPARISON:  None. FINDINGS: Frontal, lateral, and bilateral oblique views were obtained. There is no demonstrable fracture or dislocation. There is a small joint effusion. There is moderate narrowing of the patellofemoral joint. There is spurring in all compartments. No erosive change. IMPRESSION: Small joint effusion. 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 No fracture or dislocation. Electronically Signed   By: Lowella Grip III M.D.   On: 11/01/2015 08:32   Dg Foot Complete Left  11/01/2015  CLINICAL DATA:  Pain following fall EXAM: LEFT FOOT - COMPLETE 3+ VIEW COMPARISON:  None. FINDINGS: Frontal, oblique, and lateral views obtained. There is no apparent fracture or dislocation. There is narrowing of all MTP, PIP, and DIP joints. There is a cystic area in the lateral proximal fourth metatarsal which appears benign. There is spurring in the dorsal midfoot. There are posterior and inferior calcaneal spurs. There is mild pes cavus. IMPRESSION: Multilevel osteoarthritic change. Calcaneal spurs. Benign appearing cystic area in the lateral proximal fourth metatarsal. No acute fracture or dislocation. Mild pes cavus. Electronically Signed   By: Lowella Grip III M.D.   On: 11/01/2015 08:35   Ct Angio Abd/pel W/ And/or W/o  11/01/2015  CLINICAL DATA:  Acute onset of bright red blood  per rectum. Generalized weakness and dizziness. Syncope and fall, with hypotension. Left lower quadrant abdominal pain. Initial encounter. EXAM: CTA ABDOMEN AND PELVIS wITHOUT AND WITH CONTRAST TECHNIQUE: Multidetector CT imaging of the abdomen and pelvis was performed using the standard protocol during bolus administration of intravenous contrast. Multiplanar reconstructed images and MIPs were obtained and reviewed to  evaluate the vascular anatomy. CONTRAST:  151mL OMNIPAQUE IOHEXOL 300 MG/ML  SOLN COMPARISON:  CT of the abdomen and pelvis from 01/07/2015, and recent CT of the abdomen and pelvis performed earlier today at 9:19 a.m. FINDINGS: The visualized lung bases are clear. There is no evidence of aortic dissection. There is no evidence of aneurysmal dilatation. Note is made of relatively severe luminal narrowing along the proximal celiac trunk, and moderate to severe luminal narrowing along the proximal superior mesenteric artery, both due to soft plaque. However, the celiac trunk and superior mesenteric artery remain patent. The inferior mesenteric artery also appears patent. Note is made of diminutive bilateral renal arteries, with dense calcification at the origins of the renal arteries; two right-sided renal arteries are seen. Scattered calcification is noted along the abdominal aorta and its branches. The inferior vena cava is unremarkable in appearance. The liver is unremarkable in appearance. Multiple low-attenuation lesions are again noted throughout the spleen, relatively stable from 2016. The patient is status post cholecystectomy, with clips noted at the gallbladder fossa. The pancreas and adrenal glands are unremarkable. The kidneys are unremarkable in appearance. There is no evidence of hydronephrosis. No renal or ureteral stones are seen. Minimal right-sided perinephric stranding is noted. No free fluid is identified. The small bowel is unremarkable in appearance. The stomach is within normal limits. No acute vascular abnormalities are seen. The patient is status post appendectomy. Residual contrast is noted within much of the colon, limiting evaluation for intraluminal hemorrhage. There does appear to be new mild diffuse mucosal thickening along the distal transverse, descending and sigmoid colon, raising question for a mild infectious or inflammatory process. Trace surrounding soft tissue inflammation is  noted. Scattered diverticulosis is noted along the distal transverse, descending and sigmoid colon, without evidence of diverticulitis. The bladder is mildly distended and filled with contrast. The patient is status post hysterectomy. No suspicious adnexal masses are seen. No inguinal lymphadenopathy is seen. No acute osseous abnormalities are identified. There is chronic osseous fusion at L4-L5. Multilevel vacuum phenomenon is noted along the lumbar spine. Review of the MIP images confirms the above findings. IMPRESSION: 1. No evidence of aortic dissection. No evidence of aneurysmal dilatation. 2. Evaluation for intraluminal hemorrhage is markedly suboptimal given residual contrast within the colon. 3. Relatively severe luminal narrowing along the proximal celiac trunk, and moderate to severe luminal narrowing along the proximal superior mesenteric artery, both due to soft plaque. However, the celiac trunk and superior mesenteric artery remain patent. No definite evidence for bowel ischemia. 4. Apparent mild diffuse mucosal thickening along the distal transverse, descending and sigmoid colon, raising question for a mild infectious or inflammatory process; this is new from this morning. Trace surrounding soft tissue inflammation noted. Alternately, this could reflect reactive change secondary to contrast within the colon. 5. Diminutive bilateral renal arteries, with dense calcification at the origins of the renal arteries. Inferior mesenteric artery appears patent. Scattered calcification along the abdominal aorta and its branches. 6. Multiple low-attenuation lesions again noted throughout the spleen, relatively stable from 2016. 7. Scattered diverticulosis along the distal transverse, descending and sigmoid colon, without evidence of diverticulitis. 8. Chronic osseous fusion  at L4-L5, with mild diffuse underlying degenerative change along the lumbar spine. Electronically Signed   By: Garald Balding M.D.   On:  11/01/2015 21:11      ASSESSMENT: AFib, GI bleed  PLAN:  COntinue current rate control meds.  Try to increase her activity with PT or with nursing to maintain her stamina.  OK to push HR up to 140.  Anemia is contributing to fast HR as well.  No anticoagulation until cleared by GI.   Discussed with family in the room. Jettie Booze, MD  11/04/2015  1:29 PM

## 2015-11-04 NOTE — Progress Notes (Signed)
[facility] PROGRESS NOTE   Jillian Cantrell [id number] DOB: 11-20-1926 DOA: 11/01/2015 PCP: Geoffery Lyons, MD  HPI/Subjective: Hemoglobin dropped from 10.5 earlier yesterday to 8.6 this morning. Denies any bloody bowel movement.  Assessment/Plan: Principal Problem:   GI bleed Active Problems:   Fall   Physical deconditioning   Atrial fibrillation (HCC)   Hypotension   History of CVA (cerebrovascular accident)   Acute blood loss anemia   Rectal bleeding   GI bleed Patient presented with bright red blood per rectum prior to admission. Bleeding scan done and showed no evidence of bleeding. GI following, family is wondering if colonoscopy will be done. CT showed mucosal thickening in the transverse and descending colon. On clear liquids. No bowel movement since 18th.   Atrial fibrillation Has atrial fibrillation, developed rapid ventricular response probably secondary to bleeding/hypovolemia. Started on Cardizem by cardiology. still has rapid ventricular rate, increase Cardizem. Better rate control, CHA2DS2-VASc of 7, was on aspirin and Plavix both held. Per cardiology needs anticoagulation, will not start until instructed by GI.  Anemia Anemia secondary to acute blood loss from GI bleed. Patient appears to have normal baseline hemoglobin from March 2016 at 13.4.  Admitted with hemoglobin of 11.5 which gradually dropped down to 8.5. Continue to follow hemoglobin closely, transfuse if hemoglobin drops below 8.0.  Hypertension Secondary to bleeding and hypovolemia, this is resolved after IV fluid hydration.  History of CVA This is chronic, was on aspirin and Plavix likely she will need DOAC.  Physical deconditioning Lives alone at home, recent loss of her husband. Had previous stroke and right-sided hemiparesis, suspect recommendation for a SNF versus home health  Fever Developed low-grade fever of 101, check urinalysis/culture.  Hypokalemia Replete  with oral supplements.   Code Status: Full Code Family Communication: Plan discussed with the patient. Disposition Plan: Remains inpatient Diet: Diet clear liquid Room service appropriate?: Yes; Fluid consistency:: Thin  Consultants:  GI  Cardiology  Procedures:  Bleeding scan  Antibiotics:  None   Objective: Filed Vitals:   11/04/15 0822 11/04/15 1000  BP: 120/59 135/91  Pulse: 112 102  Temp: 98.8 F (37.1 C)   Resp: 17 23    Intake/Output Summary (Last 24 hours) at 11/04/15 1122 Last data filed at 11/04/15 1028  Gross per 24 hour  Intake    600 ml  Output   1050 ml  Net   -450 ml   Filed Weights   11/01/15 0552 11/01/15 2020  Weight: 74.844 kg (165 lb) 78.4 kg (172 lb 13.5 oz)    Exam: General: Alert and awake, oriented x3, not in any acute distress. HEENT: anicteric sclera, pupils reactive to light and accommodation, EOMI CVS: S1-S2 clear, no murmur rubs or gallops Chest: clear to auscultation bilaterally, no wheezing, rales or rhonchi Abdomen: soft nontender, nondistended, normal bowel sounds, no organomegaly Extremities: no cyanosis, clubbing or edema noted bilaterally Neuro: Cranial nerves II-XII intact, no focal neurological deficits  Data Reviewed: Basic Metabolic Panel:  Recent Labs Lab 11/01/15 0651 11/01/15 1015 11/02/15 0558 11/04/15 0320  NA 143  --  143 139  K 3.9  --  3.6 3.3*  CL 106  --  113* 107  CO2 26  --  25 24  GLUCOSE 122*  --  105* 102*  BUN 27*  --  24* 15  CREATININE 0.95  --  0.93 0.83  CALCIUM 9.3  --  8.8* 8.4*  MG  --  1.7  --   --  Liver Function Tests:  Recent Labs Lab 11/01/15 0651  AST 17  ALT 9*  ALKPHOS 72  BILITOT 0.4  PROT 5.4*  ALBUMIN 2.9*   No results for input(s): LIPASE, AMYLASE in the last 168 hours. No results for input(s): AMMONIA in the last 168 hours. CBC:  Recent Labs Lab 11/01/15 0651  11/01/15 1703 11/01/15 2150 11/02/15 0558 11/02/15 1244 11/03/15 0242 11/04/15 0320    WBC 8.7  --  11.0* 10.9* 7.4 7.4  --  8.5  NEUTROABS 6.7  --   --   --   --   --   --   --   HGB 11.5*  < > 11.6* 11.5* 10.5* 10.0* 9.2* 8.6*  HCT 33.4*  < > 33.9* 33.7* 30.8* 30.0* 27.8* 24.9*  MCV 93.3  --  92.6 91.1 90.6 92.6  --  92.6  PLT 188  --  171 181 174 170  --  166  < > = values in this interval not displayed. Cardiac Enzymes:  Recent Labs Lab 11/01/15 0651  TROPONINI <0.03   BNP (last 3 results) No results for input(s): BNP in the last 8760 hours.  ProBNP (last 3 results) No results for input(s): PROBNP in the last 8760 hours.  CBG:  Recent Labs Lab 11/01/15 2230  GLUCAP 122*    Micro Recent Results (from the past 240 hour(s))  MRSA PCR Screening     Status: None   Collection Time: 11/01/15  8:51 PM  Result Value Ref Range Status   MRSA by PCR NEGATIVE NEGATIVE Final    Comment:        The GeneXpert MRSA Assay (FDA approved for NASAL specimens only), is one component of a comprehensive MRSA colonization surveillance program. It is not intended to diagnose MRSA infection nor to guide or monitor treatment for MRSA infections.      Studies: No results found.  Scheduled Meds: . diltiazem  30 mg Oral 3 times per day  . nystatin   Topical TID  . sodium chloride  3 mL Intravenous Q12H   Continuous Infusions:      Time spent: 35 minutes    [facility]  [facility] Pager [phone] If 7PM-7AM, please contact night-coverage at [email or web address], password [facility] 11/04/2015, 11:22 AM  LOS: 3 days

## 2015-11-05 DIAGNOSIS — E039 Hypothyroidism, unspecified: Secondary | ICD-10-CM | POA: Diagnosis present

## 2015-11-05 LAB — CBC
HCT: 27.9 % — ABNORMAL LOW (ref 36.0–46.0)
Hemoglobin: 9.4 g/dL — ABNORMAL LOW (ref 12.0–15.0)
MCH: 31.8 pg (ref 26.0–34.0)
MCHC: 33.7 g/dL (ref 30.0–36.0)
MCV: 94.3 fL (ref 78.0–100.0)
Platelets: 192 10*3/uL (ref 150–400)
RBC: 2.96 MIL/uL — AB (ref 3.87–5.11)
RDW: 14.3 % (ref 11.5–15.5)
WBC: 9.7 10*3/uL (ref 4.0–10.5)

## 2015-11-05 LAB — MAGNESIUM: Magnesium: 1.8 mg/dL (ref 1.7–2.4)

## 2015-11-05 LAB — BASIC METABOLIC PANEL
Anion gap: 7 (ref 5–15)
BUN: 15 mg/dL (ref 6–20)
CALCIUM: 8.8 mg/dL — AB (ref 8.9–10.3)
CO2: 25 mmol/L (ref 22–32)
CREATININE: 0.95 mg/dL (ref 0.44–1.00)
Chloride: 107 mmol/L (ref 101–111)
GFR calc non Af Amer: 53 mL/min — ABNORMAL LOW (ref >60)
GLUCOSE: 157 mg/dL — AB (ref 65–99)
Potassium: 4.2 mmol/L (ref 3.5–5.1)
Sodium: 139 mmol/L (ref 135–145)

## 2015-11-05 MED ORDER — DEXTROSE 5 % IV SOLN
1.0000 g | Freq: Every day | INTRAVENOUS | Status: DC
  Administered 2015-11-05 – 2015-11-07 (×3): 1 g via INTRAVENOUS
  Filled 2015-11-05 (×3): qty 10

## 2015-11-05 NOTE — Progress Notes (Addendum)
[facility] PROGRESS NOTE   Jillian Cantrell [id number] DOB: [birth year] DOA: 11/01/2015 PCP: Geoffery Lyons, MD  HPI/Subjective: Still feeling very, denies any melena or rectal bleed. On regular diet tolerating very well. GI please advise when to start anticoagulation (was on ASA/Plavix she will need a DOAC). Had a fever, likely UTI started on Rocephin  Assessment/Plan: Principal Problem:   GI bleed Active Problems:   Fall   Physical deconditioning   Atrial fibrillation (HCC)   Hypotension   History of CVA (cerebrovascular accident)   Acute blood loss anemia   Rectal bleeding   GI bleed Patient presented with bright red blood per rectum prior to admission. Bleeding scan done and showed no evidence of bleeding. GI following, family is wondering if colonoscopy will be done. CT showed mucosal thickening in the transverse and descending colon. Started on soft diet tolerating. No evidence of bleeding hemoglobin stable.  Atrial fibrillation Has atrial fibrillation, developed rapid ventricular response probably secondary to bleeding/hypovolemia. Started on Cardizem by cardiology. still has rapid ventricular rate, increase Cardizem. Better rate control, CHA2DS2-VASc of 7, was on aspirin and Plavix both held. Per cardiology needs anticoagulation, will not start until instructed by GI.  Anemia Anemia secondary to acute blood loss from GI bleed. Patient appears to have normal baseline hemoglobin from March 2016 at 13.4.  Admitted with hemoglobin of 11.5 which gradually dropped down to 8.5. Hemoglobin better than yesterday 9.4 today.  Hypothyroidism Patient was on 175 g of Synthroid, TSH is suppressed at 0.030 and free T4 is elevated at 1.41. I have decrease Synthroid to 150 g daily.  Hypertension Secondary to bleeding and hypovolemia, this is resolved after IV fluid hydration.  History of CVA This is chronic, was on aspirin and Plavix likely she will need  DOAC.  Physical deconditioning Lives alone at home, recent loss of her husband. Had previous stroke and right-sided hemiparesis, suspect recommendation for a SNF versus home health  Fever Developed low-grade fever of 101, check urinalysis/culture. On Rocephin.  Hypokalemia Replete with oral supplements.   Code Status: Full Code Family Communication: Plan discussed with the patient. Disposition Plan: Transfer to a telemetry bed. Diet: DIET SOFT Room service appropriate?: Yes; Fluid consistency:: Thin  Consultants:  GI  Cardiology  Procedures:  Bleeding scan  Antibiotics:  None   Objective: Filed Vitals:   11/05/15 0800 11/05/15 1000  BP: 105/67 113/51  Pulse:  94  Temp: 97.5 F (36.4 C)   Resp: 14 22    Intake/Output Summary (Last 24 hours) at 11/05/15 1142 Last data filed at 11/05/15 0700  Gross per 24 hour  Intake      0 ml  Output   1250 ml  Net  -1250 ml   Filed Weights   11/01/15 0552 11/01/15 2020  Weight: 74.844 kg (165 lb) 78.4 kg (172 lb 13.5 oz)    Exam: General: Alert and awake, oriented x3, not in any acute distress. HEENT: anicteric sclera, pupils reactive to light and accommodation, EOMI CVS: S1-S2 clear, no murmur rubs or gallops Chest: clear to auscultation bilaterally, no wheezing, rales or rhonchi Abdomen: soft nontender, nondistended, normal bowel sounds, no organomegaly Extremities: no cyanosis, clubbing or edema noted bilaterally Neuro: Cranial nerves II-XII intact, no focal neurological deficits  Data Reviewed: Basic Metabolic Panel:  Recent Labs Lab 11/01/15 0651 11/01/15 1015 11/02/15 0558 11/04/15 0320 11/05/15 0240  NA 143  --  143 139 139  K 3.9  --  3.6 3.3* 4.2  CL 106  --  113* 107 107  CO2 26  --  25 24 25   GLUCOSE 122*  --  105* 102* 157*  BUN 27*  --  24* 15 15  CREATININE 0.95  --  0.93 0.83 0.95  CALCIUM 9.3  --  8.8* 8.4* 8.8*  MG  --  1.7  --   --  1.8   Liver Function Tests:  Recent Labs Lab  11/01/15 0651  AST 17  ALT 9*  ALKPHOS 72  BILITOT 0.4  PROT 5.4*  ALBUMIN 2.9*   No results for input(s): LIPASE, AMYLASE in the last 168 hours. No results for input(s): AMMONIA in the last 168 hours. CBC:  Recent Labs Lab 11/01/15 0651  11/01/15 2150 11/02/15 0558 11/02/15 1244 11/03/15 0242 11/04/15 0320 11/05/15 0240  WBC 8.7  < > 10.9* 7.4 7.4  --  8.5 9.7  NEUTROABS 6.7  --   --   --   --   --   --   --   HGB 11.5*  < > 11.5* 10.5* 10.0* 9.2* 8.6* 9.4*  HCT 33.4*  < > 33.7* 30.8* 30.0* 27.8* 24.9* 27.9*  MCV 93.3  < > 91.1 90.6 92.6  --  92.6 94.3  PLT 188  < > 181 174 170  --  166 192  < > = values in this interval not displayed. Cardiac Enzymes:  Recent Labs Lab 11/01/15 0651  TROPONINI <0.03   BNP (last 3 results) No results for input(s): BNP in the last 8760 hours.  ProBNP (last 3 results) No results for input(s): PROBNP in the last 8760 hours.  CBG:  Recent Labs Lab 11/01/15 2230  GLUCAP 122*    Micro Recent Results (from the past 240 hour(s))  MRSA PCR Screening     Status: None   Collection Time: 11/01/15  8:51 PM  Result Value Ref Range Status   MRSA by PCR NEGATIVE NEGATIVE Final    Comment:        The GeneXpert MRSA Assay (FDA approved for NASAL specimens only), is one component of a comprehensive MRSA colonization surveillance program. It is not intended to diagnose MRSA infection nor to guide or monitor treatment for MRSA infections.      Studies: No results found.  Scheduled Meds: . cefTRIAXone (ROCEPHIN)  IV  1 g Intravenous Daily  . diltiazem  60 mg Oral 3 times per day  . levothyroxine  150 mcg Oral QAC breakfast  . nystatin   Topical TID  . sodium chloride  3 mL Intravenous Q12H   Continuous Infusions:      Time spent: 35 minutes    [facility]  [facility] Pager [phone] If 7PM-7AM, please contact night-coverage at [email or web address], password [facility] 11/05/2015, 11:42 AM  LOS: 4 days

## 2015-11-05 NOTE — Progress Notes (Signed)
SUBJECTIVE:  No palpitations.  Feels weak.  OBJECTIVE:   Vitals:   Filed Vitals:   11/04/15 1928 11/05/15 0031 11/05/15 0433 11/05/15 0800  BP: 108/61 122/66 97/47 105/67  Pulse: 121 87 106   Temp: 97.7 F (36.5 C) 98.8 F (37.1 C) 99.1 F (37.3 C) 97.5 F (36.4 C)  TempSrc: Oral Oral Oral Oral  Resp: 19  26 14   Height:      Weight:      SpO2:  96% 94% 94%   I&O's:    Intake/Output Summary (Last 24 hours) at 11/05/15 1049 Last data filed at 11/05/15 0700  Gross per 24 hour  Intake      0 ml  Output   1250 ml  Net  -1250 ml   TELEMETRY: Reviewed telemetry pt in rate controlled AFib, HR increases with activity.:     PHYSICAL EXAM General: Frail Head:   Normal cephalic and atramatic  Lungs: Clear bilaterally to auscultation. Heart:  Irregularly irregular S1 S2  No JVD.   Abdomen: abdomen soft and non-tender Msk:  Generally weak Extremities:   No edema.   Neuro: Alert and oriented. Psych:  Normal affect, responds appropriately Skin: No rash   LABS: Basic Metabolic Panel:  Recent Labs  11/04/15 0320 11/05/15 0240  NA 139 139  K 3.3* 4.2  CL 107 107  CO2 24 25  GLUCOSE 102* 157*  BUN 15 15  CREATININE 0.83 0.95  CALCIUM 8.4* 8.8*  MG  --  1.8   Liver Function Tests: No results for input(s): AST, ALT, ALKPHOS, BILITOT, PROT, ALBUMIN in the last 72 hours. No results for input(s): LIPASE, AMYLASE in the last 72 hours. CBC:  Recent Labs  11/04/15 0320 11/05/15 0240  WBC 8.5 9.7  HGB 8.6* 9.4*  HCT 24.9* 27.9*  MCV 92.6 94.3  PLT 166 192   Cardiac Enzymes: No results for input(s): CKTOTAL, CKMB, CKMBINDEX, TROPONINI in the last 72 hours. BNP: Invalid input(s): POCBNP D-Dimer: No results for input(s): DDIMER in the last 72 hours. Hemoglobin A1C: No results for input(s): HGBA1C in the last 72 hours. Fasting Lipid Panel: No results for input(s): CHOL, HDL, LDLCALC, TRIG, CHOLHDL, LDLDIRECT in the last 72 hours. Thyroid Function Tests: No  results for input(s): TSH, T4TOTAL, T3FREE, THYROIDAB in the last 72 hours.  Invalid input(s): FREET3 Anemia Panel: No results for input(s): VITAMINB12, FOLATE, FERRITIN, TIBC, IRON, RETICCTPCT in the last 72 hours. Coag Panel:   No results found for: INR, PROTIME  RADIOLOGY: Ct Head Wo Contrast  11/01/2015  CLINICAL DATA:  Pain following fall EXAM: CT HEAD WITHOUT CONTRAST TECHNIQUE: Contiguous axial images were obtained from the base of the skull through the vertex without intravenous contrast. COMPARISON:  Brain MRI June 05, 2005 FINDINGS: Moderate diffuse atrophy is stable. There is a stable superior right frontal calcified meningioma without surrounding edema measuring 1.3 x 1.2 cm. There is a stable high left frontal calcified meningioma measuring 1.5 x 1.5 cm without surrounding edema. There is no new mass. There is no hemorrhage, extra-axial fluid collection, or midline shift. 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 This infarct extends to the posterior inferior left centrum semiovale, stable. There is patchy small vessel disease in the centra semiovale bilaterally, stable. There is evidence of prior infarct in the superomedial left basal ganglia region. No acute infarct evident. Bony calvarium appears intact. The mastoid air cells are clear. No intraorbital lesions. There is a retention  cyst in the anterior left sphenoid sinus region. IMPRESSION: Stable bilateral vertex meningiomas, calcified, without surrounding edema. There is atrophy with prior infarct at the left frontal-parietal junction. There is small vessel disease in the centra semiovale bilaterally as well as prior infarct in the medial left basal ganglia region. No acute infarct evident. No hemorrhage. No edema. Small retention cyst left sphenoid sinus region. Electronically Signed   By: Lowella Grip III M.D.   On: 11/01/2015 09:35   Ct Abdomen Pelvis W Contrast  11/01/2015  CLINICAL  DATA:  Fall this morning. Left lower quadrant pain. Rectal bleeding. Initial encounter. EXAM: CT ABDOMEN AND PELVIS WITH CONTRAST TECHNIQUE: Multidetector CT imaging of the abdomen and pelvis was performed using the standard protocol following bolus administration of intravenous contrast. CONTRAST:  139mL OMNIPAQUE IOHEXOL 300 MG/ML  SOLN COMPARISON:  01/07/2015 FINDINGS: Lower chest:  No acute findings. Hepatobiliary: No liver masses identified. Gallbladder is surgically absent. Mild dilatation of extrahepatic common bile duct is stable and likely related to prior cholecystectomy. Pancreas: No mass, inflammatory changes, or other significant abnormality. Spleen: No evidence of splenomegaly. Multiple low-attenuation lesions throughout the spleen show no significant change, with index lesion in the posterior splenic dome measuring 2.2 cm on image 19/series 201. Adrenals/Urinary Tract: No masses identified. No evidence of hydronephrosis. Stomach/Bowel: No evidence of obstruction, inflammatory process, or abnormal fluid collections. Severe diverticulosis again seen involving the descending and sigmoid colon, however there is no evidence of diverticulitis. Vascular/Lymphatic: No pathologically enlarged lymph nodes. No evidence of abdominal aortic aneurysm. Aortic atherosclerotic plaque noted. Reproductive: Prior hysterectomy noted. Adnexal regions are unremarkable in appearance. Other: No evidence of abdominal parenchymal organ injury or hemoperitoneum. Musculoskeletal: No suspicious bone lesions identified. No fractures identified. Advanced lumbar spine degenerative changes noted. IMPRESSION: No evidence of visceral injury, hemoperitoneum, or other acute findings. Stable multiple nonspecific low-attenuation splenic lesions, most likely benign in etiology. No evidence of splenomegaly or lymphadenopathy. Colonic diverticulosis. No radiographic evidence of diverticulitis. Electronically Signed   By: Earle Gell M.D.   On:  11/01/2015 09:42   Dg Knee Complete 4 Views Right  11/01/2015  CLINICAL DATA:  Pain following fall EXAM: RIGHT KNEE - COMPLETE 4+ VIEW COMPARISON:  None. FINDINGS: Frontal, lateral, and bilateral oblique views were obtained. There is no demonstrable fracture or dislocation. There is a small joint effusion. There is moderate narrowing of the patellofemoral joint. There is spurring in all compartments. No erosive change. IMPRESSION: Small joint effusion. 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 No fracture or dislocation. Electronically Signed   By: Lowella Grip III M.D.   On: 11/01/2015 08:32   Dg Foot Complete Left  11/01/2015  CLINICAL DATA:  Pain following fall EXAM: LEFT FOOT - COMPLETE 3+ VIEW COMPARISON:  None. FINDINGS: Frontal, oblique, and lateral views obtained. There is no apparent fracture or dislocation. There is narrowing of all MTP, PIP, and DIP joints. There is a cystic area in the lateral proximal fourth metatarsal which appears benign. There is spurring in the dorsal midfoot. There are posterior and inferior calcaneal spurs. There is mild pes cavus. IMPRESSION: Multilevel osteoarthritic change. Calcaneal spurs. Benign appearing cystic area in the lateral proximal fourth metatarsal. No acute fracture or dislocation. Mild pes cavus. Electronically Signed   By: Lowella Grip III M.D.   On: 11/01/2015 08:35   Ct Angio Abd/pel W/ And/or W/o  11/01/2015  CLINICAL DATA:  Acute onset of bright red blood per rectum. Generalized weakness and dizziness. Syncope and fall, with  hypotension. Left lower quadrant abdominal pain. Initial encounter. EXAM: CTA ABDOMEN AND PELVIS wITHOUT AND WITH CONTRAST TECHNIQUE: Multidetector CT imaging of the abdomen and pelvis was performed using the standard protocol during bolus administration of intravenous contrast. Multiplanar reconstructed images and MIPs were obtained and reviewed to evaluate the vascular anatomy. CONTRAST:  121mL  OMNIPAQUE IOHEXOL 300 MG/ML  SOLN COMPARISON:  CT of the abdomen and pelvis from 01/07/2015, and recent CT of the abdomen and pelvis performed earlier today at 9:19 a.m. FINDINGS: The visualized lung bases are clear. There is no evidence of aortic dissection. There is no evidence of aneurysmal dilatation. Note is made of relatively severe luminal narrowing along the proximal celiac trunk, and moderate to severe luminal narrowing along the proximal superior mesenteric artery, both due to soft plaque. However, the celiac trunk and superior mesenteric artery remain patent. The inferior mesenteric artery also appears patent. Note is made of diminutive bilateral renal arteries, with dense calcification at the origins of the renal arteries; two right-sided renal arteries are seen. Scattered calcification is noted along the abdominal aorta and its branches. The inferior vena cava is unremarkable in appearance. The liver is unremarkable in appearance. Multiple low-attenuation lesions are again noted throughout the spleen, relatively stable from 2016. The patient is status post cholecystectomy, with clips noted at the gallbladder fossa. The pancreas and adrenal glands are unremarkable. The kidneys are unremarkable in appearance. There is no evidence of hydronephrosis. No renal or ureteral stones are seen. Minimal right-sided perinephric stranding is noted. No free fluid is identified. The small bowel is unremarkable in appearance. The stomach is within normal limits. No acute vascular abnormalities are seen. The patient is status post appendectomy. Residual contrast is noted within much of the colon, limiting evaluation for intraluminal hemorrhage. There does appear to be new mild diffuse mucosal thickening along the distal transverse, descending and sigmoid colon, raising question for a mild infectious or inflammatory process. Trace surrounding soft tissue inflammation is noted. Scattered diverticulosis is noted along the  distal transverse, descending and sigmoid colon, without evidence of diverticulitis. The bladder is mildly distended and filled with contrast. The patient is status post hysterectomy. No suspicious adnexal masses are seen. No inguinal lymphadenopathy is seen. No acute osseous abnormalities are identified. There is chronic osseous fusion at L4-L5. Multilevel vacuum phenomenon is noted along the lumbar spine. Review of the MIP images confirms the above findings. IMPRESSION: 1. No evidence of aortic dissection. No evidence of aneurysmal dilatation. 2. Evaluation for intraluminal hemorrhage is markedly suboptimal given residual contrast within the colon. 3. Relatively severe luminal narrowing along the proximal celiac trunk, and moderate to severe luminal narrowing along the proximal superior mesenteric artery, both due to soft plaque. However, the celiac trunk and superior mesenteric artery remain patent. No definite evidence for bowel ischemia. 4. Apparent mild diffuse mucosal thickening along the distal transverse, descending and sigmoid colon, raising question for a mild infectious or inflammatory process; this is new from this morning. Trace surrounding soft tissue inflammation noted. Alternately, this could reflect reactive change secondary to contrast within the colon. 5. Diminutive bilateral renal arteries, with dense calcification at the origins of the renal arteries. Inferior mesenteric artery appears patent. Scattered calcification along the abdominal aorta and its branches. 6. Multiple low-attenuation lesions again noted throughout the spleen, relatively stable from 2016. 7. Scattered diverticulosis along the distal transverse, descending and sigmoid colon, without evidence of diverticulitis. 8. Chronic osseous fusion at L4-L5, with mild diffuse underlying degenerative change along the  lumbar spine. Electronically Signed   By: Garald Balding M.D.   On: 11/01/2015 21:11      ASSESSMENT: AFib, GI  bleed  PLAN:  COntinue current rate control meds. HR usually under 100, but up to 110 at times.   Try to increase her activity with PT or with nursing to maintain her stamina.  OK to push HR up to 140.  Anemia is contributing to fast HR as well.  Hbg better this AM.  Patient feels better.  Discussed getting her up to a chair with the nurse.   No anticoagulation until cleared by GI.    Jettie Booze, MD  11/05/2015  10:49 AM

## 2015-11-05 NOTE — Progress Notes (Signed)
Pt arrived via bed from [place]. Oriented to room, vitals taken. Telemetry started with second verification completed. Will continue to monitor as needed.

## 2015-11-06 DIAGNOSIS — I4891 Unspecified atrial fibrillation: Secondary | ICD-10-CM | POA: Insufficient documentation

## 2015-11-06 DIAGNOSIS — Z8673 Personal history of transient ischemic attack (TIA), and cerebral infarction without residual deficits: Secondary | ICD-10-CM

## 2015-11-06 DIAGNOSIS — R5381 Other malaise: Secondary | ICD-10-CM

## 2015-11-06 LAB — BASIC METABOLIC PANEL
Anion gap: 7 (ref 5–15)
BUN: 14 mg/dL (ref 6–20)
CALCIUM: 8.6 mg/dL — AB (ref 8.9–10.3)
CO2: 25 mmol/L (ref 22–32)
CREATININE: 0.93 mg/dL (ref 0.44–1.00)
Chloride: 106 mmol/L (ref 101–111)
GFR calc Af Amer: >60 mL/min (ref >60)
GFR, EST NON AFRICAN AMERICAN: 54 mL/min — AB (ref >60)
GLUCOSE: 126 mg/dL — AB (ref 65–99)
Potassium: 3.6 mmol/L (ref 3.5–5.1)
SODIUM: 138 mmol/L (ref 135–145)

## 2015-11-06 LAB — CBC
HCT: 25 % — ABNORMAL LOW (ref 36.0–46.0)
Hemoglobin: 8.6 g/dL — ABNORMAL LOW (ref 12.0–15.0)
MCH: 32.5 pg (ref 26.0–34.0)
MCHC: 34.4 g/dL (ref 30.0–36.0)
MCV: 94.3 fL (ref 78.0–100.0)
PLATELETS: 197 10*3/uL (ref 150–400)
RBC: 2.65 MIL/uL — ABNORMAL LOW (ref 3.87–5.11)
RDW: 14.4 % (ref 11.5–15.5)
WBC: 8.8 10*3/uL (ref 4.0–10.5)

## 2015-11-06 MED ORDER — DILTIAZEM HCL 60 MG PO TABS
60.0000 mg | ORAL_TABLET | Freq: Four times a day (QID) | ORAL | Status: DC
  Administered 2015-11-06 – 2015-11-07 (×4): 60 mg via ORAL
  Filled 2015-11-06 (×6): qty 1

## 2015-11-06 NOTE — Progress Notes (Signed)
[facility] PROGRESS NOTE   Jillian Cantrell [id number] DOB: 1927/09/10 DOA: 11/01/2015 PCP: Geoffery Lyons, MD  HPI/Subjective: Denies any melena or rectal bleeding, still feeling very weak. Heart rate between 90 and 110.  Assessment/Plan: Principal Problem:   GI bleed Active Problems:   Fall   Physical deconditioning   Atrial fibrillation, new onset (HCC)   Hypotension   History of CVA (cerebrovascular accident)   Acute blood loss anemia   Rectal bleeding   Hypothyroidism   GI bleed Patient presented with bright red blood per rectum prior to admission. Bleeding scan done and showed no evidence of bleeding. GI following, family is wondering if colonoscopy will be done. CT showed mucosal thickening in the transverse and descending colon. Started on soft diet tolerating. No evidence of bleeding hemoglobin stable.  Atrial fibrillation Has atrial fibrillation, developed rapid ventricular response probably secondary to bleeding/hypovolemia. Started on Cardizem by cardiology.  Better rate control, CHA2DS2-VASc of 7, was on aspirin and Plavix both held. Per cardiology needs anticoagulation, will not start until instructed by GI. Heart rate is between 90 and 110, blood pressure stable I will further increase the Cardizem. GI will reevaluate today for anticoagulation recommendation.  Anemia Anemia secondary to acute blood loss from GI bleed. Patient appears to have normal baseline hemoglobin from March 2016 at 13.4.  Admitted with hemoglobin of 11.5 which gradually dropped down to 8.5. Hemoglobin better than yesterday 9.4 today.  Hypothyroidism Patient was on 175 g of Synthroid, TSH is suppressed at 0.030 and free T4 is elevated at 1.41. I have decreased Synthroid to 150 g daily.  Hypotension Secondary to bleeding and hypovolemia, this is resolved after IV fluid hydration.  History of CVA This is chronic, was on aspirin and Plavix likely she will need  DOAC.  Physical deconditioning Lives alone at home, recent loss of her husband. Had previous stroke and right-sided hemiparesis, suspect recommendation for a SNF versus home health  UTI Developed low-grade fever of 101, check urinalysis/culture. On Rocephin.  Hypokalemia Replete with oral supplements.   Code Status: Full Code Family Communication: Plan discussed with the patient. Disposition Plan: Transfer to a telemetry bed. Diet: DIET SOFT Room service appropriate?: Yes; Fluid consistency:: Thin  Consultants:  GI  Cardiology  Procedures:  Bleeding scan  Antibiotics:  None   Objective: Filed Vitals:   11/05/15 2225 11/06/15 0506  BP: 130/64 116/61  Pulse: 104 100  Temp: 98.3 F (36.8 C) 99.1 F (37.3 C)  Resp: 18 18    Intake/Output Summary (Last 24 hours) at 11/06/15 1122 Last data filed at 11/06/15 0904  Gross per 24 hour  Intake    170 ml  Output      0 ml  Net    170 ml   Filed Weights   11/01/15 0552 11/01/15 2020  Weight: 74.844 kg (165 lb) 78.4 kg (172 lb 13.5 oz)    Exam: General: Alert and awake, oriented x3, not in any acute distress. HEENT: anicteric sclera, pupils reactive to light and accommodation, EOMI CVS: S1-S2 clear, no murmur rubs or gallops Chest: clear to auscultation bilaterally, no wheezing, rales or rhonchi Abdomen: soft nontender, nondistended, normal bowel sounds, no organomegaly Extremities: no cyanosis, clubbing or edema noted bilaterally Neuro: Cranial nerves II-XII intact, no focal neurological deficits  Data Reviewed: Basic Metabolic Panel:  Recent Labs Lab 11/01/15 0651 11/01/15 1015 11/02/15 0558 11/04/15 0320 11/05/15 0240 11/06/15 0457  NA 143  --  143 139 139 138  K 3.9  --  3.6 3.3* 4.2 3.6  CL 106  --  113* 107 107 106  CO2 26  --  25 24 25 25   GLUCOSE 122*  --  105* 102* 157* 126*  BUN 27*  --  24* 15 15 14   CREATININE 0.95  --  0.93 0.83 0.95 0.93  CALCIUM 9.3  --  8.8* 8.4* 8.8* 8.6*  MG  --   1.7  --   --  1.8  --    Liver Function Tests:  Recent Labs Lab 11/01/15 0651  AST 17  ALT 9*  ALKPHOS 72  BILITOT 0.4  PROT 5.4*  ALBUMIN 2.9*   No results for input(s): LIPASE, AMYLASE in the last 168 hours. No results for input(s): AMMONIA in the last 168 hours. CBC:  Recent Labs Lab 11/01/15 0651  11/02/15 0558 11/02/15 1244 11/03/15 0242 11/04/15 0320 11/05/15 0240 11/06/15 0457  WBC 8.7  < > 7.4 7.4  --  8.5 9.7 8.8  NEUTROABS 6.7  --   --   --   --   --   --   --   HGB 11.5*  < > 10.5* 10.0* 9.2* 8.6* 9.4* 8.6*  HCT 33.4*  < > 30.8* 30.0* 27.8* 24.9* 27.9* 25.0*  MCV 93.3  < > 90.6 92.6  --  92.6 94.3 94.3  PLT 188  < > 174 170  --  166 192 197  < > = values in this interval not displayed. Cardiac Enzymes:  Recent Labs Lab 11/01/15 0651  TROPONINI <0.03   BNP (last 3 results) No results for input(s): BNP in the last 8760 hours.  ProBNP (last 3 results) No results for input(s): PROBNP in the last 8760 hours.  CBG:  Recent Labs Lab 11/01/15 2230  GLUCAP 122*    Micro Recent Results (from the past 240 hour(s))  MRSA PCR Screening     Status: None   Collection Time: 11/01/15  8:51 PM  Result Value Ref Range Status   MRSA by PCR NEGATIVE NEGATIVE Final    Comment:        The GeneXpert MRSA Assay (FDA approved for NASAL specimens only), is one component of a comprehensive MRSA colonization surveillance program. It is not intended to diagnose MRSA infection nor to guide or monitor treatment for MRSA infections.      Studies: No results found.  Scheduled Meds: . cefTRIAXone (ROCEPHIN)  IV  1 g Intravenous Daily  . diltiazem  60 mg Oral 4 times per day  . levothyroxine  150 mcg Oral QAC breakfast  . nystatin   Topical TID  . sodium chloride  3 mL Intravenous Q12H   Continuous Infusions:      Time spent: 35 minutes    [facility]  [facility] Pager [phone] If 7PM-7AM, please contact night-coverage at [email or web address],  password [facility] 11/06/2015, 11:22 AM  LOS: 5 days

## 2015-11-06 NOTE — Care Management Important Message (Signed)
Important Message  Patient Details  Name: Jillian Cantrell MRN: [id number] Date of Birth: Oct 11, 1927   Medicare Important Message Given:  Yes    Nathen May 11/06/2015, 4:06 PM

## 2015-11-06 NOTE — NC FL2 (Deleted)
[place] MEDICAID [id number] LEVEL OF CARE SCREENING TOOL     IDENTIFICATION  Patient Name: Jillian Cantrell Birthdate: 20-May-1927 Sex: female Admission Date (Current Location): 11/01/2015  [facility] and [place] Number:  Herbalist and Address:  [facility], [place] [address], [place], [place] [address]      Provider Number: [id number]  Attending Physician Name and Address:  Verlee Monte, MD  Relative Name and Phone Number:  Delfino Lovett son, [phone]    Current Level of Care: Hospital Recommended Level of Care: [place] Prior Approval Number:    Date Approved/Denied:   PASRR Number:    Discharge Plan: SNF    Current Diagnoses: Patient Active Problem List   Diagnosis Date Noted  . Hypothyroidism 11/05/2015  . Rectal bleeding   . Acute blood loss anemia 11/02/2015  . GI bleed 11/01/2015  . Fall 11/01/2015  . Physical deconditioning 11/01/2015  . Atrial fibrillation ([place]) 11/01/2015  . Hypotension 11/01/2015  . History of CVA (cerebrovascular accident) 11/01/2015  . HLD (hyperlipidemia)     Orientation RESPIRATION BLADDER Height & Weight    Self, Time, Situation, Place  Normal Incontinent 5\' 8"  (172.7 cm) 172 lbs.  BEHAVIORAL SYMPTOMS/MOOD NEUROLOGICAL BOWEL NUTRITION STATUS   (N/A)   Continent  (Please see DC summary)  AMBULATORY STATUS COMMUNICATION OF NEEDS Skin   Extensive Assist Verbally Normal                       Personal Care Assistance Level of Assistance  Bathing, Feeding, Dressing Bathing Assistance: Limited assistance Feeding assistance: Limited assistance Dressing Assistance: Limited assistance     Functional Limitations Info             [place]  PT (By licensed PT)     PT Frequency: 5x/week              Contractures      Additional Factors Info  Code Status, Allergies Code Status Info: Full Allergies Info: Codeine           Current Medications  (11/06/2015):  This is the current hospital active medication list Current Facility-Administered Medications  Medication Dose Route Frequency Provider Last Rate Last Dose  . acetaminophen (TYLENOL) tablet 650 mg  650 mg Oral Q6H PRN Waldemar Dickens, MD   650 mg at 11/04/15 1805   Or  . acetaminophen (TYLENOL) suppository 650 mg  650 mg Rectal Q6H PRN Waldemar Dickens, MD      . cefTRIAXone (ROCEPHIN) 1 g in dextrose 5 % 50 mL IVPB  1 g Intravenous Daily Verlee Monte, MD   1 g at 11/06/15 1007  . diltiazem (CARDIZEM) tablet 60 mg  60 mg Oral 4 times per day Leonie Man, MD      . levothyroxine (SYNTHROID, LEVOTHROID) tablet 150 mcg  150 mcg Oral QAC breakfast Verlee Monte, MD   150 mcg at 11/06/15 0514  . nystatin (MYCOSTATIN/NYSTOP) topical powder   Topical TID Gardiner Barefoot, NP      . ondansetron [facility]) tablet 4 mg  4 mg Oral Q6H PRN Waldemar Dickens, MD       Or  . ondansetron [facility]) injection 4 mg  4 mg Intravenous Q6H PRN Waldemar Dickens, MD      . sodium chloride 0.9 % injection 3 mL  3 mL Intravenous Q12H Waldemar Dickens, MD   3 mL at 11/05/15 2200  Discharge Medications: Please see discharge summary for a list of discharge medications.  Relevant Imaging Results:  Relevant Lab Results:   Additional Information SSN: [place], [place]

## 2015-11-06 NOTE — Clinical Social Work Note (Signed)
Clinical Social Work Assessment  Patient Details  Name: Jillian Cantrell MRN: [id number] Date of Birth: 08-06-1927  Date of referral:  11/06/15               Reason for consult:  Facility Placement                Permission sought to share information with:  Facility Sport and exercise psychologist, Family Supports Permission granted to share information::  Yes, Verbal Permission Granted  Name::     Richard  Agency::  [facility] SNFs  Relationship::  Son  Contact Information:  [phone]  Housing/Transportation Living arrangements for the past 2 months:  [place] of Information:  Patient, Adult Children Patient Interpreter Needed:  None Criminal Activity/Legal Involvement Pertinent to Current Situation/Hospitalization:  No - Comment as needed Significant Relationships:  Adult Children Lives with:  Self Do you feel safe going back to the place where you live?  No Need for family participation in patient care:  Yes (Comment)  Care giving concerns:  CSW received referral for possible SNF placement at time of discharge. CSW met with patient and patient's son at bedside regarding PT recommendation of SNF placement at time of discharge. Per patient's son, patient is currently unable to care for patient at home given patient's current physical needs and fall risk. Patient and patient's wife expressed understanding of PT recommendation and are agreeable to SNF placement at time of discharge. CSW to continue to follow and assist with discharge planning needs.   Social Worker assessment / plan:  CSW spoke with patient and patient's son concerning possibility of rehab at [facility] before returning home.  Employment status:  Retired Forensic scientist:  Medicare PT Recommendations:  [place] / Referral to community resources:  [place]  Patient/Family's Response to care:  Patient and patient's son recognize need for rehab before  returning home and are agreeable to a SNF in [place].   Patient/Family's Understanding of and Emotional Response to Diagnosis, Current Treatment, and Prognosis:  Patient is realistic regarding therapy needs. No questions/concerns about plan or treatment.    Emotional Assessment Appearance:  Appears stated age Attitude/Demeanor/Rapport:   (Appropriate) Affect (typically observed):  Accepting, Appropriate Orientation:  Oriented to Self, Oriented to Place, Oriented to  Time, Oriented to Situation Alcohol / Substance use:  Not Applicable Psych involvement (Current and /or in the community):  No (Comment)  Discharge Needs  Concerns to be addressed:  Care Coordination Readmission within the last 30 days:  No Current discharge risk:  None Barriers to Discharge:  Continued Medical Work up   [employer], [place] 11/06/2015, 12:29 PM

## 2015-11-06 NOTE — Care Management Note (Signed)
Case Management Note  Patient Details  Name: LACHELLE SPROWL MRN: [id number] Date of Birth: 1927/05/18  Subjective/Objective:                  Patient comes from home alone with HHA. Admitted for GI bleed, LOS 5 days. Transferred from [facility] 11/05/15.    Action/Plan:  Anticipate DC to SNF as facilitated through [facility].  Expected Discharge Date:                  Expected Discharge Plan:  Skilled Nursing Facility  In-House Referral:  Clinical Social Work  Discharge planning Services  CM Consult  Post Acute Care Choice:    Choice offered to:     DME Arranged:    DME Agency:     HH Arranged:    [place] Agency:     Status of Service:  Completed, signed off  Medicare Important Message Given:  Yes Date Medicare IM Given:    Medicare IM give by:    Date Additional Medicare IM Given:    Additional Medicare Important Message give by:     If discussed at [place] of Stay Meetings, dates discussed:    Additional Comments:  Carles Collet, RN 11/06/2015, 1:23 PM

## 2015-11-06 NOTE — NC FL2 (Signed)
[place] MEDICAID FL2 LEVEL OF CARE SCREENING TOOL     IDENTIFICATION  Patient Name: Jillian Cantrell Birthdate: 1927/08/16 Sex: female Admission Date (Current Location): 11/01/2015  [facility] and [place] Number:  Herbalist and Address:  [facility], [place] [address], [place], [place] [address]      Provider Number: [id number]  Attending Physician Name and Address:  Verlee Monte, MD  Relative Name and Phone Number:  Delfino Lovett son, [phone]    Current Level of Care: Hospital Recommended Level of Care: [place] Prior Approval Number:    Date Approved/Denied:   PASRR Number:   [id number]   Discharge Plan: SNF    Current Diagnoses: Patient Active Problem List   Diagnosis Date Noted  . Hypothyroidism 11/05/2015  . Rectal bleeding   . Acute blood loss anemia 11/02/2015  . GI bleed 11/01/2015  . Fall 11/01/2015  . Physical deconditioning 11/01/2015  . Atrial fibrillation ([place]) 11/01/2015  . Hypotension 11/01/2015  . History of CVA (cerebrovascular accident) 11/01/2015  . HLD (hyperlipidemia)     Orientation RESPIRATION BLADDER Height & Weight    Self, Time, Situation, Place  Normal Incontinent 5\' 8"  (172.7 cm) 172 lbs.  BEHAVIORAL SYMPTOMS/MOOD NEUROLOGICAL BOWEL NUTRITION STATUS   (N/A)   Continent  (Please see DC summary)  AMBULATORY STATUS COMMUNICATION OF NEEDS Skin   Extensive Assist Verbally Normal                       Personal Care Assistance Level of Assistance  Bathing, Feeding, Dressing Bathing Assistance: Limited assistance Feeding assistance: Limited assistance Dressing Assistance: Limited assistance     Functional Limitations Info             [place]  PT (By licensed PT)     PT Frequency: 5x/week              Contractures      Additional Factors Info  Code Status, Allergies Code Status Info: Full Allergies Info: [place]           Current  Medications (11/06/2015):  This is the current hospital active medication list Current Facility-Administered Medications  Medication Dose Route Frequency Provider Last Rate Last Dose  . acetaminophen (TYLENOL) tablet 650 mg  650 mg Oral Q6H PRN Waldemar Dickens, MD   650 mg at 11/04/15 1805   Or  . acetaminophen (TYLENOL) suppository 650 mg  650 mg Rectal Q6H PRN Waldemar Dickens, MD      . cefTRIAXone (ROCEPHIN) 1 g in dextrose 5 % 50 mL IVPB  1 g Intravenous Daily Verlee Monte, MD   1 g at 11/06/15 1007  . diltiazem (CARDIZEM) tablet 60 mg  60 mg Oral 4 times per day Leonie Man, MD      . levothyroxine (SYNTHROID, LEVOTHROID) tablet 150 mcg  150 mcg Oral QAC breakfast Verlee Monte, MD   150 mcg at 11/06/15 0514  . nystatin (MYCOSTATIN/NYSTOP) topical powder   Topical TID [place] Barefoot, NP      . ondansetron [facility]) tablet 4 mg  4 mg Oral Q6H PRN Waldemar Dickens, MD       Or  . ondansetron [facility]) injection 4 mg  4 mg Intravenous Q6H PRN Waldemar Dickens, MD      . sodium chloride 0.9 % injection 3 mL  3 mL Intravenous Q12H Waldemar Dickens, MD   3 mL at 11/05/15 2200  Discharge Medications: Please see discharge summary for a list of discharge medications.  Relevant Imaging Results:  Relevant Lab Results:   Additional Information SSN: [place], [place]

## 2015-11-06 NOTE — Progress Notes (Signed)
Requesting MD: Dr. Hartford Poli [facility]) PCP: Geoffery Lyons, MD GI: Dr. Collene Mares Primary Cardiologist:NEW - Hochrein  82 year old female, with hx of stroke, HTN and thyroid cancer- we have been asked to see for new a fib, rate is controlled. Pt had been in usual state of health until yesterday and began with bloody bowel movements. When she fell she was weak all over. She states she did not pass out. Her family found her on her back. Found to be anemic - likely GI bleed & with Afib RVR.  Plan was rate control  SUBJECTIVE:  Rare palpitations.  Feels weak.  Still has yet to stand (has severe R sided weakness from old CVA NO CP or SOB. Notes Back & L shoulder pain this AM  OBJECTIVE:   Vitals:   Filed Vitals:   11/05/15 1400 11/05/15 1515 11/05/15 2225 11/06/15 0506  BP: 128/77 111/54 130/64 116/61  Pulse:  98 104 100  Temp:  99.8 F (37.7 C) 98.3 F (36.8 C) 99.1 F (37.3 C)  TempSrc:  Oral Oral Oral  Resp: 21 18 18 18   Height:      Weight:      SpO2: 95% 95% 96% 95%   I&O's:    Intake/Output Summary (Last 24 hours) at 11/06/15 1117 Last data filed at 11/06/15 [id number]  Gross per 24 hour  Intake    170 ml  Output      0 ml  Net    170 ml   TELEMETRY: Reviewed telemetry pt in rate controlled AFib (currently 94), HR increases with activity.:   PHYSICAL EXAM General: Frail, very weak (R UE &LE hemiparesis. Head:   Normal cephalic and atramatic  Lungs: Clear bilaterally to auscultation. Non-labored, no W/R/R Heart:  Irregularly irregular S1 & S2  No JVD.  Soft SEM, but no other M/R/G Abdomen: abdomen soft and non-tender Msk:  Generally weak (RUE paralyzed) Extremities:   No edema.   Neuro: Alert and oriented. Psych:  Normal affect, responds appropriately Skin: No rash   LABS: Basic Metabolic Panel:  Recent Labs  11/05/15 0240 11/06/15 0457  NA 139 138  K 4.2 3.6  CL 107 106  CO2 25 25  GLUCOSE 157* 126*  BUN 15 14  CREATININE 0.95 0.93  CALCIUM 8.8* 8.6*    MG 1.8  --    Liver Function Tests: No results for input(s): AST, ALT, ALKPHOS, BILITOT, PROT, ALBUMIN in the last 72 hours. No results for input(s): LIPASE, AMYLASE in the last 72 hours. CBC:  Recent Labs  11/05/15 0240 11/06/15 0457  WBC 9.7 8.8  HGB 9.4* 8.6*  HCT 27.9* 25.0*  MCV 94.3 94.3  PLT 192 197   Cardiac Enzymes: No results for input(s): CKTOTAL, CKMB, CKMBINDEX, TROPONINI in the last 72 hours. BNP: Invalid input(s): POCBNP D-Dimer: No results for input(s): DDIMER in the last 72 hours. Hemoglobin A1C: No results for input(s): HGBA1C in the last 72 hours. Fasting Lipid Panel: No results for input(s): CHOL, HDL, LDLCALC, TRIG, CHOLHDL, LDLDIRECT in the last 72 hours. Thyroid Function Tests: No results for input(s): TSH, T4TOTAL, T3FREE, THYROIDAB in the last 72 hours.  Invalid input(s): FREET3 Anemia Panel: No results for input(s): VITAMINB12, FOLATE, FERRITIN, TIBC, IRON, RETICCTPCT in the last 72 hours. Coag Panel:   No results found for: INR, PROTIME  RADIOLOGY: reviewed  ASSESSMENT: AFib, GI bleed Principal Problem:   GI bleed Active Problems:   Atrial fibrillation, new onset ([place])   History of CVA (  cerebrovascular accident)   Acute blood loss anemia   Physical deconditioning   Hypotension   Rectal bleeding   Fall   Hypothyroidism   PLAN:   Afib - plan is for Rate control using CCB - will increase to 60mg  PO QID (with plan to convert to 120 mg BID on d/c).   HR usually under 100, but up to 110 at times.   Needs to be OOB & attempting to ambulate - OK to push HR to ~140 if no Sx of dizziness etc.  CHA2DS2VASC Score is @ least 6 if not higher, but with recent fall & GIB, would hold off on anticoagulation until GI clears.  Hgb back down from yesterday - likley GIB, would need GI approval prior to considering DOAC vs. ASA/Plavix --> this decision can be made in OP setting.  No CHF Sx & No angina.  Echo with preserved EF.  At this point,  plan is rate control for Afib with plan for d/c (hopefully to Rehab @ SNF) - needs to be evaluated re: strength & stability (she lives alone).   COntinue current rate control meds.  Try to increase her activity with PT or with nursing to maintain her stamina.  OK to push HR up to 140.  Anemia is contributing to fast HR as well.  Hbg better this AM.  Patient feels better.  Discussed getting her up to a chair with the nurse.   No anticoagulation until cleared by GI.    Leonie Man, MD  11/06/2015  11:17 AM

## 2015-11-07 DIAGNOSIS — K625 Hemorrhage of anus and rectum: Secondary | ICD-10-CM | POA: Diagnosis not present

## 2015-11-07 DIAGNOSIS — R1311 Dysphagia, oral phase: Secondary | ICD-10-CM | POA: Diagnosis not present

## 2015-11-07 DIAGNOSIS — I480 Paroxysmal atrial fibrillation: Secondary | ICD-10-CM | POA: Diagnosis not present

## 2015-11-07 DIAGNOSIS — M6281 Muscle weakness (generalized): Secondary | ICD-10-CM | POA: Diagnosis not present

## 2015-11-07 DIAGNOSIS — K921 Melena: Secondary | ICD-10-CM | POA: Diagnosis not present

## 2015-11-07 DIAGNOSIS — R41841 Cognitive communication deficit: Secondary | ICD-10-CM | POA: Diagnosis not present

## 2015-11-07 DIAGNOSIS — E039 Hypothyroidism, unspecified: Secondary | ICD-10-CM | POA: Diagnosis not present

## 2015-11-07 DIAGNOSIS — D62 Acute posthemorrhagic anemia: Secondary | ICD-10-CM | POA: Diagnosis not present

## 2015-11-07 DIAGNOSIS — E032 Hypothyroidism due to medicaments and other exogenous substances: Secondary | ICD-10-CM | POA: Diagnosis not present

## 2015-11-07 DIAGNOSIS — E785 Hyperlipidemia, unspecified: Secondary | ICD-10-CM | POA: Diagnosis not present

## 2015-11-07 DIAGNOSIS — K922 Gastrointestinal hemorrhage, unspecified: Secondary | ICD-10-CM | POA: Diagnosis not present

## 2015-11-07 DIAGNOSIS — R29898 Other symptoms and signs involving the musculoskeletal system: Secondary | ICD-10-CM | POA: Diagnosis not present

## 2015-11-07 DIAGNOSIS — K5900 Constipation, unspecified: Secondary | ICD-10-CM | POA: Diagnosis not present

## 2015-11-07 DIAGNOSIS — I4891 Unspecified atrial fibrillation: Secondary | ICD-10-CM | POA: Diagnosis not present

## 2015-11-07 DIAGNOSIS — I959 Hypotension, unspecified: Secondary | ICD-10-CM | POA: Diagnosis not present

## 2015-11-07 DIAGNOSIS — R600 Localized edema: Secondary | ICD-10-CM | POA: Diagnosis not present

## 2015-11-07 DIAGNOSIS — R5381 Other malaise: Secondary | ICD-10-CM | POA: Diagnosis not present

## 2015-11-07 DIAGNOSIS — W19XXXD Unspecified fall, subsequent encounter: Secondary | ICD-10-CM | POA: Diagnosis not present

## 2015-11-07 DIAGNOSIS — D509 Iron deficiency anemia, unspecified: Secondary | ICD-10-CM | POA: Diagnosis not present

## 2015-11-07 DIAGNOSIS — R531 Weakness: Secondary | ICD-10-CM | POA: Diagnosis not present

## 2015-11-07 DIAGNOSIS — Z8673 Personal history of transient ischemic attack (TIA), and cerebral infarction without residual deficits: Secondary | ICD-10-CM | POA: Diagnosis not present

## 2015-11-07 DIAGNOSIS — K5730 Diverticulosis of large intestine without perforation or abscess without bleeding: Secondary | ICD-10-CM | POA: Diagnosis not present

## 2015-11-07 DIAGNOSIS — R29818 Other symptoms and signs involving the nervous system: Secondary | ICD-10-CM | POA: Diagnosis not present

## 2015-11-07 DIAGNOSIS — E440 Moderate protein-calorie malnutrition: Secondary | ICD-10-CM | POA: Diagnosis not present

## 2015-11-07 DIAGNOSIS — I110 Hypertensive heart disease with heart failure: Secondary | ICD-10-CM | POA: Diagnosis not present

## 2015-11-07 LAB — CBC
HEMATOCRIT: 26 % — AB (ref 36.0–46.0)
HEMOGLOBIN: 8.6 g/dL — AB (ref 12.0–15.0)
MCH: 30.9 pg (ref 26.0–34.0)
MCHC: 33.1 g/dL (ref 30.0–36.0)
MCV: 93.5 fL (ref 78.0–100.0)
Platelets: 250 10*3/uL (ref 150–400)
RBC: 2.78 MIL/uL — AB (ref 3.87–5.11)
RDW: 14.1 % (ref 11.5–15.5)
WBC: 7.8 10*3/uL (ref 4.0–10.5)

## 2015-11-07 LAB — BASIC METABOLIC PANEL
ANION GAP: 6 (ref 5–15)
BUN: 15 mg/dL (ref 6–20)
CALCIUM: 8.7 mg/dL — AB (ref 8.9–10.3)
CHLORIDE: 105 mmol/L (ref 101–111)
CO2: 25 mmol/L (ref 22–32)
Creatinine, Ser: 0.83 mg/dL (ref 0.44–1.00)
GFR calc non Af Amer: >60 mL/min (ref >60)
GLUCOSE: 127 mg/dL — AB (ref 65–99)
POTASSIUM: 3.5 mmol/L (ref 3.5–5.1)
Sodium: 136 mmol/L (ref 135–145)

## 2015-11-07 LAB — URINE CULTURE

## 2015-11-07 MED ORDER — LEVOTHYROXINE SODIUM 150 MCG PO TABS: 150.0000 ug | ORAL_TABLET | Freq: Every day | ORAL | Status: DC

## 2015-11-07 MED ORDER — DILTIAZEM HCL 120 MG PO TABS: 60.0000 mg | ORAL_TABLET | Freq: Two times a day (BID) | ORAL | Status: DC

## 2015-11-07 MED ORDER — CEFUROXIME AXETIL 500 MG PO TABS: 500.0000 mg | ORAL_TABLET | Freq: Two times a day (BID) | ORAL | Status: DC

## 2015-11-07 NOTE — Progress Notes (Signed)
Requesting MD: Dr. Hartford Poli [facility]) PCP: Geoffery Lyons, MD GI: Dr. Collene Mares Primary Cardiologist:NEW - Hochrein  82 year old female, with hx of stroke, HTN and thyroid cancer- we have been asked to see for new a fib, rate is controlled. Pt had been in usual state of health until yesterday and began with bloody bowel movements. When she fell she was weak all over. She states she did not pass out. Her family found her on her back. Found to be anemic - likely GI bleed & with Afib RVR.  Plan was rate control  SUBJECTIVE:  Rare palpitations.  Feels weak.   (has severe R sided weakness from old CVA NO CP or SOB. Notes Back & L shoulder pain this AM  OBJECTIVE:   Vitals:   Filed Vitals:   11/06/15 1810 11/06/15 2151 11/06/15 2324 11/07/15 0601  BP: 123/65 112/69 116/63 131/66  Pulse:  117 97 92  Temp:  100.3 F (37.9 C) 98.9 F (37.2 C) 98 F (36.7 C)  TempSrc:  Oral Oral Oral  Resp:  19  18  Height:      Weight:      SpO2:  93%  95%   I&O's:    Intake/Output Summary (Last 24 hours) at 11/07/15 0956 Last data filed at 11/06/15 2325  Gross per 24 hour  Intake    243 ml  Output      0 ml  Net    243 ml   TELEMETRY: Reviewed telemetry pt in rate controlled AFib (currently 94), HR increases with activity.:   PHYSICAL EXAM General: Frail, very weak (R UE &LE hemiparesis. Head:   Normal cephalic and atramatic  Lungs: CTAB. Non-labored, no W/R/R Heart:  Irregularly irregular S1 & S2  No JVD.  Soft SEM, but no other M/R/G Abdomen: abdomen soft and non-tender Msk:  Generally weak (RUE paralyzed) Extremities:   No edema.   Neuro: Alert and oriented. Psych:  Normal affect, responds appropriately Skin: No rash   LABS: Basic Metabolic Panel:  Recent Labs  11/05/15 0240 11/06/15 0457 11/07/15 0526  NA 139 138 136  K 4.2 3.6 3.5  CL 107 106 105  CO2 25 25 25   GLUCOSE 157* 126* 127*  BUN 15 14 15   CREATININE 0.95 0.93 0.83  CALCIUM 8.8* 8.6* 8.7*  MG 1.8  --    --    Liver Function Tests: No results for input(s): AST, ALT, ALKPHOS, BILITOT, PROT, ALBUMIN in the last 72 hours. No results for input(s): LIPASE, AMYLASE in the last 72 hours. CBC:  Recent Labs  11/06/15 0457 11/07/15 0526  WBC 8.8 7.8  HGB 8.6* 8.6*  HCT 25.0* 26.0*  MCV 94.3 93.5  PLT 197 250    RADIOLOGY: reviewed  ASSESSMENT: AFib, GI bleed Principal Problem:   GI bleed Active Problems:   Atrial fibrillation, new onset ([facility])   History of CVA (cerebrovascular accident)   Acute blood loss anemia   Physical deconditioning   Hypotension   Rectal bleeding   Fall   Hypothyroidism   Atrial fibrillation ([place]rell)   PLAN:   Afib - plan is for Rate control using CCB - tolerated increase to 60mg  PO QID (with plan to convert to 120 mg BID on d/c).   HR usually under 100, but up to 110 at times. (currently in 80-90s)  Needs to be OOB & attempting to ambulate - OK to push HR to ~140 if no Sx of dizziness etc.  CHA2DS2VASC Score is @  least 6 if not higher, but with recent fall & GIB, would hold off on anticoagulation until GI clears. --> most likley would not start until OP f/u.  Hgb back down yesterday - stable today:  likley GIB, would need GI approval prior to considering DOAC vs. ASA/Plavix --> this decision can be made in OP setting.  No CHF Sx & No angina.  Echo with preserved EF.  At this point, plan is rate control for Afib with plan for d/c (hopefully to Rehab @ SNF) - needs to be evaluated re: strength & stability (she lives alone).  Plan is for d/c today - consider ASA 81 mg only until seen in f/u & GI give opinion.  COntinue current rate control meds.  Try to increase her activity with PT or with nursing to maintain her stamina.  OK to push HR up to 140.  Anemia is contributing to fast HR as well.  Hbg better this AM.  Patient feels better.  Discussed getting her up to a chair with the nurse.   No anticoagulation until cleared by GI.   Will arrange close f/u  in [place] with Dr. Percival Spanish or APP.   Leonie Man, MD  11/07/2015  9:56 AM  . Interventional Cardiologist   Pager # [phone] Phone # [phone] [address] [place], [place] [address]

## 2015-11-07 NOTE — Clinical Social Work Placement (Signed)
   CLINICAL SOCIAL WORK PLACEMENT  NOTE  Date:  11/07/2015  Patient Details  Name: Jillian Cantrell MRN: [id number] Date of Birth: 01-06-1927  Clinical Social Work is seeking post-discharge placement for this patient at the [place] level of care (*CSW will initial, date and re-position this form in  chart as items are completed):  Yes   Patient/family provided with [place] Work Department's list of facilities offering this level of care within the geographic area requested by the patient (or if unable, by the patient's family).  Yes   Patient/family informed of their freedom to choose among providers that offer the needed level of care, that participate in Medicare, Medicaid or managed care program needed by the patient, have an available bed and are willing to accept the patient.  Yes   Patient/family informed of [facility]'s ownership interest in [facility] and [facility], as well as of the fact that they are under no obligation to receive care at these facilities.  PASRR submitted to EDS on 11/06/15     PASRR number received on 11/06/15     Existing PASRR number confirmed on       FL2 transmitted to all facilities in geographic area requested by pt/family on 11/06/15     FL2 transmitted to all facilities within larger geographic area on       Patient informed that his/her managed care company has contracts with or will negotiate with certain facilities, including the following:        Yes   Patient/family informed of bed offers received.  Patient chooses bed at [place], [place]     Physician recommends and patient chooses bed at      Patient to be transferred to [place] on 11/07/15.  Patient to be transferred to facility by [facility]     Patient family notified on 11/07/15 of transfer.  Name of family member notified:  Daughter     PHYSICIAN Please prepare prescriptions     Additional Comment:     _______________________________________________ Benard Halsted, [place] 11/07/2015, 11:27 AM

## 2015-11-07 NOTE — Progress Notes (Signed)
Physical Therapy Treatment Patient Details Name: Jillian Cantrell MRN: [id number] DOB: 1927/08/18 Today's Date: 11/07/2015    History of Present Illness 82 yo female s/p fall at home with 9th rib fx and GIB. PMH: R hemiplegia, CVA, HTN, thyroid cancer    PT Comments    Able to assist with sit/stand with +2 max assist X2. Anticipating patient will D/C to SNF to address further rehabilitation needs.   Follow Up Recommendations  SNF;Supervision/Assistance - 24 hour     Equipment Recommendations  None recommended by PT    Recommendations for Other Services       Precautions / Restrictions Precautions Precautions: Fall Restrictions Weight Bearing Restrictions: No    Mobility  Bed Mobility Overal bed mobility: Needs Assistance Bed Mobility: Supine to Sit     Supine to sit: +2 for physical assistance;Mod assist Sit to supine: +2 for physical assistance;Mod assist   General bed mobility comments: assist at LEs and trunk  Transfers Overall transfer level: Needs assistance Equipment used: 2 person hand held assist Transfers: Sit to/from Stand Sit to Stand: +2 physical assistance;Max assist         General transfer comment: sit/stand X2  Ambulation/Gait Ambulation/Gait assistance: Max assist;+2 physical assistance Ambulation Distance (Feet): 2 Feet Assistive device: 2 person hand held assist       General Gait Details: heavy posterior lean, shuffling forward steps.    Stairs            Wheelchair Mobility    Modified Rankin (Stroke Patients Only)       Balance Overall balance assessment: Needs assistance Sitting-balance support: Single extremity supported Sitting balance-Leahy Scale: Fair     Standing balance support: Single extremity supported Standing balance-Leahy Scale: Poor                      Cognition Arousal/Alertness: Awake/alert Behavior During Therapy: Anxious;Flat affect Overall Cognitive Status: Within Functional  Limits for tasks assessed                      Exercises      General Comments        Pertinent Vitals/Pain Pain Assessment: No/denies pain    Home Living                      Prior Function            PT Goals (current goals can now be found in the care plan section) Acute Rehab PT Goals Patient Stated Goal: be able to move better PT Goal Formulation: With patient/family Time For Goal Achievement: 11/17/15 Potential to Achieve Goals: Good Progress towards PT goals: Progressing toward goals    Frequency  Min 2X/week    PT Plan Current plan remains appropriate    Co-evaluation             End of Session Equipment Utilized During Treatment: Gait belt Activity Tolerance: Patient limited by fatigue Patient left: in bed;with call bell/phone within reach;with bed alarm set;with family/visitor present;with SCD's reapplied     Time: [id number] PT Time Calculation (min) (ACUTE ONLY): 23 min  Charges:  $Therapeutic Exercise: 23-37 mins                    G Codes:      Cassell Clement, PT, CSCS Pager [phone] Office [phone]  11/07/2015, 1:31 PM

## 2015-11-07 NOTE — Progress Notes (Signed)
Pt informed of discharge, iv site was removed, pt was switched to paper scrubs, [facility] was called and report was given to [place]. Pt to be transported to facility via [facility]. Daughter explained details and is at bedside. Pt to go to room 805 A.

## 2015-11-07 NOTE — Discharge Summary (Signed)
Physician Discharge Summary  Jillian Cantrell [id number] DOB: 04/03/1927 DOA: 11/01/2015  PCP: Geoffery Lyons, MD  Admit date: 11/01/2015 Discharge date: 11/07/2015  Time spent: 40 minutes  Recommendations for Outpatient Follow-up:  1. Follow up in the nursing home M.D. in with cardiology within 1 week. 2. Started on Cardizem 120 mg twice a day, discontinued lisinopril. 3. Continued low-dose aspirin and discontinued Plavix. 4. Synthroid switched from 175 g to 150 g. 5. Check CBC in 1 week, compare hemoglobin. 6. Continue Ceftin for 5 more days.   Discharge Diagnoses:  Principal Problem:   GI bleed Active Problems:   Fall   Physical deconditioning   Atrial fibrillation, new onset ([place])   Hypotension   History of CVA (cerebrovascular accident)   Acute blood loss anemia   Rectal bleeding   Hypothyroidism   Atrial fibrillation [facility])   Discharge Condition: Stable  Diet recommendation: Heart healthy  Filed Weights   11/01/15 0552 11/01/15 2020  Weight: 74.844 kg (165 lb) 78.4 kg (172 lb 13.5 oz)    History of present illness:  Jillian Cantrell is a 83 y.o. female  Fall. Patient states that she fell this morning after she became weak all over. When she fell she hit her head. Denies any chest pains, palpitations, dizziness, lightheadedness, vertigo, palpitations, LOC. Persistent headache since that time. Denies any loss of bowel or bladder function or tongue biting. Complete recollection of the entire event.  Patient also reports multiple bloody bowel movements. Started 1 day ago. 7 episodes in past 24 hours. Lower abdominal crampy pain.Marland Kitchen Has not tried anything for her symptoms. Nothing makes her symptoms better or worse. Problem is intermittent.  After patient's fall she pressed her life alert button was called EMS to her home for evaluation  Hospital Course:   GI bleed Patient presented with bright red blood per rectum prior to admission. Bleeding scan done by  IR and showed no evidence of bleeding. GI following, CT showed mucosal thickening in the transverse and descending colon. Tolerated soft diet very well, might need colonoscopy as outpatient. Follow-up TI. Placed on only low-dose aspirin on discharge.   Atrial fibrillation Has atrial fibrillation, developed rapid ventricular response probably secondary to bleeding/hypovolemia. Started on Cardizem by cardiology.  on discharge she is on bisoprolol 2.5 mg restarted and continued 120 mg of Cardizem BID. Better rate control, CHA2DS2-VASc of 7, was on aspirin and Plavix both held. Per cardiology needs anticothat recommended full dose aspirin because of recent bleeding.   Anemia Anemia secondary to acute blood loss from GI bleed. Patient appears to have normal baseline hemoglobin from March 2016 at 13.4.  Admitted with hemoglobin of 11.5 which gradually dropped down to 8.5. Hemoglobstable at 8.6 on discharge, did not require any transfusion during this hospital stay.  Hypothyroidism Patient was on 175 g of Synthroid, TSH is suppressed at 0.030 and free T4 is elevated at 1.41. I have decreased Synthroid to 150 g daily.  Hypotension Secondary to bleeding and hypovolemia, this is resolved after IV fluid hydration.  History of CVA This is chronic, was on aspirin and Plavix, according to her CHADS2 score she needs a DOAC. Because of the recent bleeding only low-dose of aspirin recommended by cardiology.  Physical deconditioning Lives alone at home, recent loss of her husband. Had previous stroke and right-sided hemiparesis, PT recommended SNF placement.   UTI Developed low-grade fever of 101, urinalysis and culture showed Escherichia coli UTI. Discharge on Ceftin for 5 more days.  Hypokalemia Repleted  with oral supplements.   Procedures:  None  Consultations:  Cardio  GI  Discharge Exam: Filed Vitals:   11/06/15 2324 11/07/15 0601  BP: 116/63 131/66  Pulse: 97 92  Temp:  98.9 F (37.2 C) 98 F (36.7 C)  Resp:  18   General: Alert and awake, oriented x3, not in any acute distress. HEENT: anicteric sclera, pupils reactive to light and accommodation, EOMI CVS: S1-S2 clear, no murmur rubs or gallops Chest: clear to auscultation bilaterally, no wheezing, rales or rhonchi Abdomen: soft nontender, nondistended, normal bowel sounds, no organomegaly Extremities: no cyanosis, clubbing or edema noted bilaterally Neuro: Cranial nerves II-XII intact, no focal neurological deficits  Discharge Instructions   Discharge Instructions    Diet - low sodium heart healthy    Complete by:  As directed      Increase activity slowly    Complete by:  As directed           Current Discharge Medication List    START taking these medications   Details  cefUROXime (CEFTIN) 500 MG tablet Take 1 tablet (500 mg total) by mouth 2 (two) times daily with a meal.    diltiazem (CARDIZEM) 120 MG tablet Take 0.5 tablets (60 mg total) by mouth 2 (two) times daily.      CONTINUE these medications which have CHANGED   Details  levothyroxine (SYNTHROID, LEVOTHROID) 150 MCG tablet Take 1 tablet (150 mcg total) by mouth daily before breakfast.      CONTINUE these medications which have NOT CHANGED   Details  aspirin 81 MG EC tablet Take 81 mg by mouth daily. Swallow whole.    bisoprolol-hydrochlorothiazide (ZIAC) 2.5-6.25 MG per tablet Take 1 tablet by mouth daily.    simvastatin (ZOCOR) 20 MG tablet Take 20 mg by mouth daily.      STOP taking these medications     celecoxib (CELEBREX) 200 MG capsule      clopidogrel (PLAVIX) 75 MG tablet      enalapril (VASOTEC) 5 MG tablet      furosemide (LASIX) 40 MG tablet      potassium chloride SA (K-DUR,KLOR-CON) 20 MEQ tablet      traMADol (ULTRAM) 50 MG tablet        Allergies  Allergen Reactions  . Codeine Hives   Follow-up Information    Follow up with Minus Breeding, MD.   Specialty:  Cardiology   Contact  information:   [address] [place] [address] [phone]        The results of significant diagnostics from this hospitalization (including imaging, microbiology, ancillary and laboratory) are listed below for reference.    Significant Diagnostic Studies: Ct Head Wo Contrast  11/01/2015  CLINICAL DATA:  Pain following fall EXAM: CT HEAD WITHOUT CONTRAST TECHNIQUE: Contiguous axial images were obtained from the base of the skull through the vertex without intravenous contrast. COMPARISON:  Brain MRI June 05, 2005 FINDINGS: Moderate diffuse atrophy is stable. There is a stable superior right frontal calcified meningioma without surrounding edema measuring 1.3 x 1.2 cm. There is a stable high left frontal calcified meningioma measuring 1.5 x 1.5 cm without surrounding edema. There is no new mass. There is no hemorrhage, extra-axial fluid collection, or midline shift. 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 This infarct extends to the posterior inferior left centrum semiovale, stable. There is patchy small vessel disease in the centra semiovale bilaterally, stable. There is evidence of  prior infarct in the superomedial left basal ganglia region. No acute infarct evident. Bony calvarium appears intact. The mastoid air cells are clear. No intraorbital lesions. There is a retention cyst in the anterior left sphenoid sinus region. IMPRESSION: Stable bilateral vertex meningiomas, calcified, without surrounding edema. There is atrophy with prior infarct at the left frontal-parietal junction. There is small vessel disease in the centra semiovale bilaterally as well as prior infarct in the medial left basal ganglia region. No acute infarct evident. No hemorrhage. No edema. Small retention cyst left sphenoid sinus region. Electronically Signed   By: Lowella Grip III M.D.   On: 11/01/2015 09:35   Ct Abdomen Pelvis W Contrast  11/01/2015   CLINICAL DATA:  Fall this morning. Left lower quadrant pain. Rectal bleeding. Initial encounter. EXAM: CT ABDOMEN AND PELVIS WITH CONTRAST TECHNIQUE: Multidetector CT imaging of the abdomen and pelvis was performed using the standard protocol following bolus administration of intravenous contrast. CONTRAST:  132mL OMNIPAQUE IOHEXOL 300 MG/ML  SOLN COMPARISON:  01/07/2015 FINDINGS: Lower chest:  No acute findings. Hepatobiliary: No liver masses identified. Gallbladder is surgically absent. Mild dilatation of extrahepatic common bile duct is stable and likely related to prior cholecystectomy. Pancreas: No mass, inflammatory changes, or other significant abnormality. Spleen: No evidence of splenomegaly. Multiple low-attenuation lesions throughout the spleen show no significant change, with index lesion in the posterior splenic dome measuring 2.2 cm on image 19/series 201. Adrenals/Urinary Tract: No masses identified. No evidence of hydronephrosis. Stomach/Bowel: No evidence of obstruction, inflammatory process, or abnormal fluid collections. Severe diverticulosis again seen involving the descending and sigmoid colon, however there is no evidence of diverticulitis. Vascular/Lymphatic: No pathologically enlarged lymph nodes. No evidence of abdominal aortic aneurysm. Aortic atherosclerotic plaque noted. Reproductive: Prior hysterectomy noted. Adnexal regions are unremarkable in appearance. Other: No evidence of abdominal parenchymal organ injury or hemoperitoneum. Musculoskeletal: No suspicious bone lesions identified. No fractures identified. Advanced lumbar spine degenerative changes noted. IMPRESSION: No evidence of visceral injury, hemoperitoneum, or other acute findings. Stable multiple nonspecific low-attenuation splenic lesions, most likely benign in etiology. No evidence of splenomegaly or lymphadenopathy. Colonic diverticulosis. No radiographic evidence of diverticulitis. Electronically Signed   By: Earle Gell  M.D.   On: 11/01/2015 09:42   Dg Knee Complete 4 Views Right  11/01/2015  CLINICAL DATA:  Pain following fall EXAM: RIGHT KNEE - COMPLETE 4+ VIEW COMPARISON:  None. FINDINGS: Frontal, lateral, and bilateral oblique views were obtained. There is no demonstrable fracture or dislocation. There is a small joint effusion. There is moderate narrowing of the patellofemoral joint. There is spurring in all compartments. No erosive change. IMPRESSION: Small joint effusion. 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 No fracture or dislocation. Electronically Signed   By: Lowella Grip III M.D.   On: 11/01/2015 08:32   Dg Foot Complete Left  11/01/2015  CLINICAL DATA:  Pain following fall EXAM: LEFT FOOT - COMPLETE 3+ VIEW COMPARISON:  None. FINDINGS: Frontal, oblique, and lateral views obtained. There is no apparent fracture or dislocation. There is narrowing of all MTP, PIP, and DIP joints. There is a cystic area in the lateral proximal fourth metatarsal which appears benign. There is spurring in the dorsal midfoot. There are posterior and inferior calcaneal spurs. There is mild pes cavus. IMPRESSION: Multilevel osteoarthritic change. Calcaneal spurs. Benign appearing cystic area in the lateral proximal fourth metatarsal. No acute fracture or dislocation. Mild pes cavus. Electronically Signed   By: Lowella Grip III M.D.   On: 11/01/2015 08:35  Ct Angio Abd/pel W/ And/or W/o  11/01/2015  CLINICAL DATA:  Acute onset of bright red blood per rectum. Generalized weakness and dizziness. Syncope and fall, with hypotension. Left lower quadrant abdominal pain. Initial encounter. EXAM: CTA ABDOMEN AND PELVIS wITHOUT AND WITH CONTRAST TECHNIQUE: Multidetector CT imaging of the abdomen and pelvis was performed using the standard protocol during bolus administration of intravenous contrast. Multiplanar reconstructed images and MIPs were obtained and reviewed to evaluate the vascular anatomy. CONTRAST:   116mL OMNIPAQUE IOHEXOL 300 MG/ML  SOLN COMPARISON:  CT of the abdomen and pelvis from 01/07/2015, and recent CT of the abdomen and pelvis performed earlier today at 9:19 a.m. FINDINGS: The visualized lung bases are clear. There is no evidence of aortic dissection. There is no evidence of aneurysmal dilatation. Note is made of relatively severe luminal narrowing along the proximal celiac trunk, and moderate to severe luminal narrowing along the proximal superior mesenteric artery, both due to soft plaque. However, the celiac trunk and superior mesenteric artery remain patent. The inferior mesenteric artery also appears patent. Note is made of diminutive bilateral renal arteries, with dense calcification at the origins of the renal arteries; two right-sided renal arteries are seen. Scattered calcification is noted along the abdominal aorta and its branches. The inferior vena cava is unremarkable in appearance. The liver is unremarkable in appearance. Multiple low-attenuation lesions are again noted throughout the spleen, relatively stable from 2016. The patient is status post cholecystectomy, with clips noted at the gallbladder fossa. The pancreas and adrenal glands are unremarkable. The kidneys are unremarkable in appearance. There is no evidence of hydronephrosis. No renal or ureteral stones are seen. Minimal right-sided perinephric stranding is noted. No free fluid is identified. The small bowel is unremarkable in appearance. The stomach is within normal limits. No acute vascular abnormalities are seen. The patient is status post appendectomy. Residual contrast is noted within much of the colon, limiting evaluation for intraluminal hemorrhage. There does appear to be new mild diffuse mucosal thickening along the distal transverse, descending and sigmoid colon, raising question for a mild infectious or inflammatory process. Trace surrounding soft tissue inflammation is noted. Scattered diverticulosis is noted  along the distal transverse, descending and sigmoid colon, without evidence of diverticulitis. The bladder is mildly distended and filled with contrast. The patient is status post hysterectomy. No suspicious adnexal masses are seen. No inguinal lymphadenopathy is seen. No acute osseous abnormalities are identified. There is chronic osseous fusion at L4-L5. Multilevel vacuum phenomenon is noted along the lumbar spine. Review of the MIP images confirms the above findings. IMPRESSION: 1. No evidence of aortic dissection. No evidence of aneurysmal dilatation. 2. Evaluation for intraluminal hemorrhage is markedly suboptimal given residual contrast within the colon. 3. Relatively severe luminal narrowing along the proximal celiac trunk, and moderate to severe luminal narrowing along the proximal superior mesenteric artery, both due to soft plaque. However, the celiac trunk and superior mesenteric artery remain patent. No definite evidence for bowel ischemia. 4. Apparent mild diffuse mucosal thickening along the distal transverse, descending and sigmoid colon, raising question for a mild infectious or inflammatory process; this is new from this morning. Trace surrounding soft tissue inflammation noted. Alternately, this could reflect reactive change secondary to contrast within the colon. 5. Diminutive bilateral renal arteries, with dense calcification at the origins of the renal arteries. Inferior mesenteric artery appears patent. Scattered calcification along the abdominal aorta and its branches. 6. Multiple low-attenuation lesions again noted throughout the spleen, relatively stable from 2016. 7.  Scattered diverticulosis along the distal transverse, descending and sigmoid colon, without evidence of diverticulitis. 8. Chronic osseous fusion at L4-L5, with mild diffuse underlying degenerative change along the lumbar spine. Electronically Signed   By: Garald Balding M.D.   On: 11/01/2015 21:11    Microbiology: Recent  Results (from the past 240 hour(s))  MRSA PCR Screening     Status: None   Collection Time: 11/01/15  8:51 PM  Result Value Ref Range Status   MRSA by PCR NEGATIVE NEGATIVE Final    Comment:        The GeneXpert MRSA Assay (FDA approved for NASAL specimens only), is one component of a comprehensive MRSA colonization surveillance program. It is not intended to diagnose MRSA infection nor to guide or monitor treatment for MRSA infections.   Urine culture     Status: None   Collection Time: 11/04/15  2:46 PM  Result Value Ref Range Status   Specimen Description URINE, RANDOM  Final   Special Requests NONE  Final   Culture >=100,000 COLONIES/mL ESCHERICHIA COLI  Final   Report Status 11/07/2015 FINAL  Final   Organism ID, Bacteria ESCHERICHIA COLI  Final      Susceptibility   Escherichia coli - MIC*    AMPICILLIN <=2 SENSITIVE Sensitive     CEFAZOLIN <=4 SENSITIVE Sensitive     CEFTRIAXONE <=1 SENSITIVE Sensitive     CIPROFLOXACIN <=0.25 SENSITIVE Sensitive     GENTAMICIN <=1 SENSITIVE Sensitive     IMIPENEM <=0.25 SENSITIVE Sensitive     NITROFURANTOIN <=16 SENSITIVE Sensitive     TRIMETH/SULFA <=20 SENSITIVE Sensitive     AMPICILLIN/SULBACTAM <=2 SENSITIVE Sensitive     PIP/TAZO <=4 SENSITIVE Sensitive     * >=100,000 COLONIES/mL ESCHERICHIA COLI     Labs: Basic Metabolic Panel:  Recent Labs Lab 11/01/15 1015 11/02/15 0558 11/04/15 0320 11/05/15 0240 11/06/15 0457 11/07/15 0526  NA  --  143 139 139 138 136  K  --  3.6 3.3* 4.2 3.6 3.5  CL  --  113* 107 107 106 105  CO2  --  25 24 25 25 25   GLUCOSE  --  105* 102* 157* 126* 127*  BUN  --  24* 15 15 14 15   CREATININE  --  0.93 0.83 0.95 0.93 0.83  CALCIUM  --  8.8* 8.4* 8.8* 8.6* 8.7*  MG 1.7  --   --  1.8  --   --    Liver Function Tests:  Recent Labs Lab 11/01/15 0651  AST 17  ALT 9*  ALKPHOS 72  BILITOT 0.4  PROT 5.4*  ALBUMIN 2.9*   No results for input(s): LIPASE, AMYLASE in the last 168  hours. No results for input(s): AMMONIA in the last 168 hours. CBC:  Recent Labs Lab 11/01/15 0651  11/02/15 1244 11/03/15 0242 11/04/15 0320 11/05/15 0240 11/06/15 0457 11/07/15 0526  WBC 8.7  < > 7.4  --  8.5 9.7 8.8 7.8  NEUTROABS 6.7  --   --   --   --   --   --   --   HGB 11.5*  < > 10.0* 9.2* 8.6* 9.4* 8.6* 8.6*  HCT 33.4*  < > 30.0* 27.8* 24.9* 27.9* 25.0* 26.0*  MCV 93.3  < > 92.6  --  92.6 94.3 94.3 93.5  PLT 188  < > 170  --  166 192 197 250  < > = values in this interval not displayed. Cardiac Enzymes:  Recent Labs Lab  11/01/15 0651  TROPONINI <0.03   BNP: BNP (last 3 results) No results for input(s): BNP in the last 8760 hours.  ProBNP (last 3 results) No results for input(s): PROBNP in the last 8760 hours.  CBG:  Recent Labs Lab 11/01/15 2230  GLUCAP 122*       Signed:  Kuper Rennels A MD.  [facility] 11/07/2015, 10:38 AM

## 2015-11-07 NOTE — Progress Notes (Signed)
Patient will DC to: [facility] Anticipated DC date: 11/07/15 Family notified: Daughter Transport by: PTAR 12:30pm  CSW signing off.  Cedric Fishman, [place] Social Worker [phone]

## 2015-11-08 DIAGNOSIS — R531 Weakness: Secondary | ICD-10-CM | POA: Diagnosis not present

## 2015-11-08 DIAGNOSIS — E440 Moderate protein-calorie malnutrition: Secondary | ICD-10-CM | POA: Diagnosis not present

## 2015-11-08 DIAGNOSIS — I110 Hypertensive heart disease with heart failure: Secondary | ICD-10-CM | POA: Diagnosis not present

## 2015-11-08 DIAGNOSIS — K922 Gastrointestinal hemorrhage, unspecified: Secondary | ICD-10-CM | POA: Diagnosis not present

## 2015-11-08 DIAGNOSIS — R5381 Other malaise: Secondary | ICD-10-CM | POA: Diagnosis not present

## 2015-11-08 DIAGNOSIS — I480 Paroxysmal atrial fibrillation: Secondary | ICD-10-CM | POA: Diagnosis not present

## 2015-11-15 ENCOUNTER — Ambulatory Visit: Payer: Medicare Other | Admitting: Physician Assistant

## 2015-11-22 ENCOUNTER — Ambulatory Visit (INDEPENDENT_AMBULATORY_CARE_PROVIDER_SITE_OTHER): Payer: Medicare Other | Admitting: Physician Assistant

## 2015-11-22 ENCOUNTER — Encounter: Payer: Self-pay | Admitting: Physician Assistant

## 2015-11-22 VITALS — BP 124/70 | HR 84 | Ht 67.0 in | Wt 172.1 lb

## 2015-11-22 DIAGNOSIS — R600 Localized edema: Secondary | ICD-10-CM

## 2015-11-22 DIAGNOSIS — I4891 Unspecified atrial fibrillation: Secondary | ICD-10-CM

## 2015-11-22 DIAGNOSIS — E079 Disorder of thyroid, unspecified: Secondary | ICD-10-CM | POA: Insufficient documentation

## 2015-11-22 DIAGNOSIS — E032 Hypothyroidism due to medicaments and other exogenous substances: Secondary | ICD-10-CM | POA: Insufficient documentation

## 2015-11-22 NOTE — Patient Instructions (Signed)
Continue all current medications. Please notify the office if GI says okay for you to be on blood thinners.  Follow up in  3 months with Dr. Percival Spanish

## 2015-11-22 NOTE — Progress Notes (Signed)
Cardiology Office Note   Date:  11/22/2015   ID:  Jillian Cantrell, DOB Feb 06, 1927, MRN [id number]  PCP:  Geoffery Lyons, MD  Cardiologist:  Dr Mardene Celeste, PA-C   Chief Complaint  Patient presents with  . Hospitalization Follow-up     no chest pain, no shortness of breath, has edema, no pain or cramping in legs,no  lightheadedness or dizziness    History of Present Illness: Jillian Cantrell is a 82 y.o. female with a history of CVA, HTN, thyroid CA.   Admitted 01/18-01/24 w/ GIB, cards saw for afib (new dx). Rate control, no anticoag till cleared by GI. CHADSVASC=6  [name] presents for post hospital follow-up.  Since discharge from the hospital, [name] has been working with rehabilitation at her facility. She is getting stronger and starting to walk with a cane. If she continues to improve, she will be discharged in about a week. She has good family support at home with an aide during the day for 3 hours and her daughter or daughter-in-law comes by every evening at bedtime.  She has no palpitations and no awareness of her atrial fibrillation. She denies orthopnea or PND. She has some daytime lower extremity edema, but doesn't think she wakes up with it in the morning. She has not been getting weight regularly. She is concerned because her thyroid dose is lower than it was prior to admission. She is also concerned about all the medication changes that were made during her hospitalization.   Past Medical History  Diagnosis Date  . Stroke [facility]) 2003    Chronic right-sided weakness  . Hypertension   . Thyroid disease   . Cancer [facility])     thyroid cancer s/p thyroidectomy  . Atrial fibrillation with normal ventricular rate ([place]) 10/2015    Rate control, no anticoagulation secondary to GI bleed  . Iatrogenic hypothyroidism     Past Surgical History  Procedure Laterality Date  . Cholecystectomy    . Abdominal hysterectomy    . Appendectomy      . Varicose vein surgery    . Thyroidectomy      Current Outpatient Prescriptions  Medication Sig Dispense Refill  . aspirin 81 MG EC tablet Take 81 mg by mouth daily. Swallow whole.    . bisoprolol-hydrochlorothiazide (ZIAC) 2.5-6.25 MG per tablet Take 1 tablet by mouth daily.    Marland Kitchen diltiazem (CARDIZEM) 120 MG tablet Take 0.5 tablets (60 mg total) by mouth 2 (two) times daily.    Marland Kitchen levothyroxine (SYNTHROID, LEVOTHROID) 150 MCG tablet Take 1 tablet (150 mcg total) by mouth daily before breakfast.    . simvastatin (ZOCOR) 20 MG tablet Take 20 mg by mouth daily.     No current facility-administered medications for this visit.    Allergies:   Codeine    Social History:  The patient  reports that she quit smoking about 47 years ago. She does not have any smokeless tobacco history on file. She reports that she does not drink alcohol or use illicit drugs.   Family History:  The patient's family history includes Cancer in her father.    ROS:  Please see the history of present illness. All other systems are reviewed and negative.    PHYSICAL EXAM: VS:  BP 124/70 mmHg  Pulse 84  Ht 5\' 7"  (1.702 m)  Wt 172 lb 1 oz (78.047 kg)  BMI 26.94 kg/m2 , BMI Body mass index is 26.94 kg/(m^2). GEN: Well  nourished, well developed, female in no acute distress HEENT: normal for age  Neck: no JVD, no carotid bruit, no masses; negative hepatojugular reflux Cardiac: Irregular rate and rhythm; no murmur, no rubs, or gallops Respiratory:  clear to auscultation bilaterally, normal work of breathing GI: soft, nontender, nondistended, + BS MS: no deformity or atrophy; 1+ bilateral lower extremity edema; distal pulses are 2+ in all 4 extremities  Skin: warm and dry, no rash Neuro:  Strength and sensation are intact Psych: euthymic mood, full affect   EKG:  EKG is not ordered today  Recent Labs: 11/01/2015: ALT 9*; TSH 0.030* 11/05/2015: Magnesium 1.8 11/07/2015: BUN 15; Creatinine, Ser 0.83; Hemoglobin  8.6*; Platelets 250; Potassium 3.5; Sodium 136    Lipid Panel No results found for: CHOL, TRIG, HDL, CHOLHDL, VLDL, LDLCALC, LDLDIRECT   Wt Readings from Last 3 Encounters:  11/22/15 172 lb 1 oz (78.047 kg)  11/01/15 172 lb 13.5 oz (78.4 kg)     Other studies Reviewed: Additional studies/ records that were reviewed today include: Hospital records and testing.  ASSESSMENT AND PLAN:  1.  Atrial fibrillation, controlled ventricular response: She is not having any discernible symptoms from the atrial fibrillation. Her rate is controlled on a combination of bisoprolol and Cardizem. Continue these medications. She had mild biatrial dilatation on echo.  2. Anticoagulation: CHADSVASC=6. Recent hospitalization when the atrial fibrillation was diagnosed, she also had a GI bleed. At that time, she was on aspirin and Plavix. Therefore she is taking a baby aspirin a day but no other anticoagulation at this time. Currently, the risk outweighs the benefits.   3. GI bleed: She has not followed up with GI. A source for the bleed was not found during her previous hospitalization. A bleeding scan showed no evidence of bleeding and a CT showed mucosal thickening only. Family was advised that she should follow-up with GI for further evaluation of the source of bleeding and determination if it is possible to anticoagulate her with relative safety.  4. Volume overload: She has mild lower extremity edema that his daytime only. Her EF was normal with no mention of diastolic dysfunction during her recent hospitalization. Her PAS was mildly elevated at 38. We will add daily weights and have her track her weight. If she gains 5 pounds in a week or 3 pounds in a day, she may need a higher dose of HCTZ or when necessary Lasix.  5. Hypothyroidism: The patient is concerned because her Synthroid dose is lower than it was pre-admission. She is requested to follow-up with her primary care physician for this and was reassured  that the only reason her Synthroid was decreased was because her TSH was low (0.30).   Current medicines are reviewed at length with the patient today.  The patient has concerns regarding medicines. Concerns were addressed  The following changes have been made:  no change  Labs/ tests ordered today include:   Disposition:   FU with Dr. Percival Spanish in 3 months  Signed, Lenoard Aden  11/22/2015 10:55 AM    [address], [place], [place]  [id number] Phone: [phone]; Fax: [phone]

## 2015-11-23 ENCOUNTER — Telehealth: Payer: Self-pay | Admitting: Physician Assistant

## 2015-11-23 DIAGNOSIS — K625 Hemorrhage of anus and rectum: Secondary | ICD-10-CM

## 2015-11-23 DIAGNOSIS — D62 Acute posthemorrhagic anemia: Secondary | ICD-10-CM

## 2015-11-23 NOTE — Telephone Encounter (Signed)
Returned call to patient's daughter Olin Hauser.Stated she was told she needed to see GI and was not given a appointment.Advised discharge instructions say call GI office and find out if ok to take blood thinner.Stated mother saw Dr.Mann in the hospital and would like [place] to schedule mother appointment to see her.Scheduler will call back with appointment.

## 2015-11-23 NOTE — Telephone Encounter (Signed)
New message   Pt has referral for eye doctor and she believes it was done in error pt needs GI

## 2015-12-12 DIAGNOSIS — D509 Iron deficiency anemia, unspecified: Secondary | ICD-10-CM | POA: Diagnosis not present

## 2015-12-12 DIAGNOSIS — K5900 Constipation, unspecified: Secondary | ICD-10-CM | POA: Diagnosis not present

## 2015-12-12 DIAGNOSIS — K5730 Diverticulosis of large intestine without perforation or abscess without bleeding: Secondary | ICD-10-CM | POA: Diagnosis not present

## 2015-12-13 ENCOUNTER — Encounter: Payer: Self-pay | Admitting: Cardiology

## 2015-12-15 ENCOUNTER — Telehealth: Payer: Self-pay | Admitting: Cardiology

## 2015-12-15 NOTE — Telephone Encounter (Signed)
Jillian Cantrell from [facility] called and reported patients weight has increased from 173 lbs on 2/23 to 178 lbs on 3/1.  Weight on 2/27 175 lbs and 3/1 178 lbs.  Does have some lower extremity pitting edema does not have any other complaints BP 121/73 HR 96 R 18  Routed to Dr. Percival Spanish

## 2015-12-15 NOTE — Telephone Encounter (Signed)
She faxed over  Information for Dr Warren Lacy to see on 12-13-15. She still have not heard anything,was this fax received? If not she will refax,when will Dr Warren Lacy be in?Please call and let her know something.

## 2015-12-17 NOTE — Telephone Encounter (Signed)
She should be seen by an APP this week.  Or she could be put on my schedule if I have an opening.

## 2015-12-20 NOTE — Telephone Encounter (Signed)
Routing this message to NL triage.  I am not scheduled to work at [employer]

## 2015-12-20 NOTE — Telephone Encounter (Signed)
Spoke w/ nurse at [facility] - she states pt has had improvement of symptoms this week, wt at 174 lbs, no SOB, no complaints, edema improved.  Advised of recommendations - pt to be discharged home later this week.  Pt elects to wait on evaluation until home - I advised to make sure patient calls at home w/ update or nursing home calls if any regression of condition.  Recommendations communicated.

## 2015-12-22 ENCOUNTER — Encounter: Payer: Self-pay | Admitting: *Deleted

## 2015-12-25 DIAGNOSIS — Z85850 Personal history of malignant neoplasm of thyroid: Secondary | ICD-10-CM | POA: Diagnosis not present

## 2015-12-25 DIAGNOSIS — I272 Other secondary pulmonary hypertension: Secondary | ICD-10-CM | POA: Diagnosis not present

## 2015-12-25 DIAGNOSIS — M1711 Unilateral primary osteoarthritis, right knee: Secondary | ICD-10-CM | POA: Diagnosis not present

## 2015-12-25 DIAGNOSIS — K922 Gastrointestinal hemorrhage, unspecified: Secondary | ICD-10-CM | POA: Diagnosis not present

## 2015-12-25 DIAGNOSIS — W19XXXD Unspecified fall, subsequent encounter: Secondary | ICD-10-CM | POA: Diagnosis not present

## 2015-12-25 DIAGNOSIS — Z87440 Personal history of urinary (tract) infections: Secondary | ICD-10-CM | POA: Diagnosis not present

## 2015-12-25 DIAGNOSIS — I69351 Hemiplegia and hemiparesis following cerebral infarction affecting right dominant side: Secondary | ICD-10-CM | POA: Diagnosis not present

## 2015-12-25 DIAGNOSIS — I4891 Unspecified atrial fibrillation: Secondary | ICD-10-CM | POA: Diagnosis not present

## 2015-12-27 DIAGNOSIS — I4891 Unspecified atrial fibrillation: Secondary | ICD-10-CM | POA: Diagnosis not present

## 2015-12-27 DIAGNOSIS — I272 Other secondary pulmonary hypertension: Secondary | ICD-10-CM | POA: Diagnosis not present

## 2015-12-27 DIAGNOSIS — M1711 Unilateral primary osteoarthritis, right knee: Secondary | ICD-10-CM | POA: Diagnosis not present

## 2015-12-27 DIAGNOSIS — I69351 Hemiplegia and hemiparesis following cerebral infarction affecting right dominant side: Secondary | ICD-10-CM | POA: Diagnosis not present

## 2015-12-27 DIAGNOSIS — K922 Gastrointestinal hemorrhage, unspecified: Secondary | ICD-10-CM | POA: Diagnosis not present

## 2015-12-27 DIAGNOSIS — W19XXXD Unspecified fall, subsequent encounter: Secondary | ICD-10-CM | POA: Diagnosis not present

## 2016-01-01 DIAGNOSIS — M1711 Unilateral primary osteoarthritis, right knee: Secondary | ICD-10-CM | POA: Diagnosis not present

## 2016-01-01 DIAGNOSIS — W19XXXD Unspecified fall, subsequent encounter: Secondary | ICD-10-CM | POA: Diagnosis not present

## 2016-01-01 DIAGNOSIS — I272 Other secondary pulmonary hypertension: Secondary | ICD-10-CM | POA: Diagnosis not present

## 2016-01-01 DIAGNOSIS — K922 Gastrointestinal hemorrhage, unspecified: Secondary | ICD-10-CM | POA: Diagnosis not present

## 2016-01-01 DIAGNOSIS — I4891 Unspecified atrial fibrillation: Secondary | ICD-10-CM | POA: Diagnosis not present

## 2016-01-01 DIAGNOSIS — I69351 Hemiplegia and hemiparesis following cerebral infarction affecting right dominant side: Secondary | ICD-10-CM | POA: Diagnosis not present

## 2016-01-03 DIAGNOSIS — W19XXXD Unspecified fall, subsequent encounter: Secondary | ICD-10-CM | POA: Diagnosis not present

## 2016-01-03 DIAGNOSIS — I69351 Hemiplegia and hemiparesis following cerebral infarction affecting right dominant side: Secondary | ICD-10-CM | POA: Diagnosis not present

## 2016-01-03 DIAGNOSIS — M1711 Unilateral primary osteoarthritis, right knee: Secondary | ICD-10-CM | POA: Diagnosis not present

## 2016-01-03 DIAGNOSIS — I272 Other secondary pulmonary hypertension: Secondary | ICD-10-CM | POA: Diagnosis not present

## 2016-01-03 DIAGNOSIS — K922 Gastrointestinal hemorrhage, unspecified: Secondary | ICD-10-CM | POA: Diagnosis not present

## 2016-01-03 DIAGNOSIS — I4891 Unspecified atrial fibrillation: Secondary | ICD-10-CM | POA: Diagnosis not present

## 2016-01-08 DIAGNOSIS — I272 Other secondary pulmonary hypertension: Secondary | ICD-10-CM | POA: Diagnosis not present

## 2016-01-08 DIAGNOSIS — K922 Gastrointestinal hemorrhage, unspecified: Secondary | ICD-10-CM | POA: Diagnosis not present

## 2016-01-08 DIAGNOSIS — I4891 Unspecified atrial fibrillation: Secondary | ICD-10-CM | POA: Diagnosis not present

## 2016-01-08 DIAGNOSIS — W19XXXD Unspecified fall, subsequent encounter: Secondary | ICD-10-CM | POA: Diagnosis not present

## 2016-01-08 DIAGNOSIS — M1711 Unilateral primary osteoarthritis, right knee: Secondary | ICD-10-CM | POA: Diagnosis not present

## 2016-01-08 DIAGNOSIS — I69351 Hemiplegia and hemiparesis following cerebral infarction affecting right dominant side: Secondary | ICD-10-CM | POA: Diagnosis not present

## 2016-01-10 DIAGNOSIS — I4891 Unspecified atrial fibrillation: Secondary | ICD-10-CM | POA: Diagnosis not present

## 2016-01-10 DIAGNOSIS — M1711 Unilateral primary osteoarthritis, right knee: Secondary | ICD-10-CM | POA: Diagnosis not present

## 2016-01-10 DIAGNOSIS — W19XXXD Unspecified fall, subsequent encounter: Secondary | ICD-10-CM | POA: Diagnosis not present

## 2016-01-10 DIAGNOSIS — I272 Other secondary pulmonary hypertension: Secondary | ICD-10-CM | POA: Diagnosis not present

## 2016-01-10 DIAGNOSIS — K922 Gastrointestinal hemorrhage, unspecified: Secondary | ICD-10-CM | POA: Diagnosis not present

## 2016-01-10 DIAGNOSIS — I69351 Hemiplegia and hemiparesis following cerebral infarction affecting right dominant side: Secondary | ICD-10-CM | POA: Diagnosis not present

## 2016-01-17 DIAGNOSIS — I4891 Unspecified atrial fibrillation: Secondary | ICD-10-CM | POA: Diagnosis not present

## 2016-01-17 DIAGNOSIS — I69351 Hemiplegia and hemiparesis following cerebral infarction affecting right dominant side: Secondary | ICD-10-CM | POA: Diagnosis not present

## 2016-01-17 DIAGNOSIS — W19XXXD Unspecified fall, subsequent encounter: Secondary | ICD-10-CM | POA: Diagnosis not present

## 2016-01-17 DIAGNOSIS — I272 Other secondary pulmonary hypertension: Secondary | ICD-10-CM | POA: Diagnosis not present

## 2016-01-17 DIAGNOSIS — K922 Gastrointestinal hemorrhage, unspecified: Secondary | ICD-10-CM | POA: Diagnosis not present

## 2016-01-17 DIAGNOSIS — M1711 Unilateral primary osteoarthritis, right knee: Secondary | ICD-10-CM | POA: Diagnosis not present

## 2016-01-18 DIAGNOSIS — I272 Other secondary pulmonary hypertension: Secondary | ICD-10-CM | POA: Diagnosis not present

## 2016-01-18 DIAGNOSIS — M1711 Unilateral primary osteoarthritis, right knee: Secondary | ICD-10-CM | POA: Diagnosis not present

## 2016-01-18 DIAGNOSIS — I69351 Hemiplegia and hemiparesis following cerebral infarction affecting right dominant side: Secondary | ICD-10-CM | POA: Diagnosis not present

## 2016-01-18 DIAGNOSIS — W19XXXD Unspecified fall, subsequent encounter: Secondary | ICD-10-CM | POA: Diagnosis not present

## 2016-01-18 DIAGNOSIS — I4891 Unspecified atrial fibrillation: Secondary | ICD-10-CM | POA: Diagnosis not present

## 2016-01-18 DIAGNOSIS — K922 Gastrointestinal hemorrhage, unspecified: Secondary | ICD-10-CM | POA: Diagnosis not present

## 2016-01-21 DIAGNOSIS — K922 Gastrointestinal hemorrhage, unspecified: Secondary | ICD-10-CM | POA: Diagnosis not present

## 2016-01-21 DIAGNOSIS — I480 Paroxysmal atrial fibrillation: Secondary | ICD-10-CM | POA: Diagnosis not present

## 2016-01-21 DIAGNOSIS — E038 Other specified hypothyroidism: Secondary | ICD-10-CM | POA: Diagnosis not present

## 2016-01-21 DIAGNOSIS — I69959 Hemiplegia and hemiparesis following unspecified cerebrovascular disease affecting unspecified side: Secondary | ICD-10-CM | POA: Diagnosis not present

## 2016-01-26 DIAGNOSIS — I272 Other secondary pulmonary hypertension: Secondary | ICD-10-CM | POA: Diagnosis not present

## 2016-01-26 DIAGNOSIS — W19XXXD Unspecified fall, subsequent encounter: Secondary | ICD-10-CM | POA: Diagnosis not present

## 2016-01-26 DIAGNOSIS — I4891 Unspecified atrial fibrillation: Secondary | ICD-10-CM | POA: Diagnosis not present

## 2016-01-26 DIAGNOSIS — K922 Gastrointestinal hemorrhage, unspecified: Secondary | ICD-10-CM | POA: Diagnosis not present

## 2016-01-26 DIAGNOSIS — M1711 Unilateral primary osteoarthritis, right knee: Secondary | ICD-10-CM | POA: Diagnosis not present

## 2016-01-26 DIAGNOSIS — I69351 Hemiplegia and hemiparesis following cerebral infarction affecting right dominant side: Secondary | ICD-10-CM | POA: Diagnosis not present

## 2016-01-29 DIAGNOSIS — I69351 Hemiplegia and hemiparesis following cerebral infarction affecting right dominant side: Secondary | ICD-10-CM | POA: Diagnosis not present

## 2016-01-29 DIAGNOSIS — I4891 Unspecified atrial fibrillation: Secondary | ICD-10-CM | POA: Diagnosis not present

## 2016-01-29 DIAGNOSIS — W19XXXD Unspecified fall, subsequent encounter: Secondary | ICD-10-CM | POA: Diagnosis not present

## 2016-01-29 DIAGNOSIS — K922 Gastrointestinal hemorrhage, unspecified: Secondary | ICD-10-CM | POA: Diagnosis not present

## 2016-01-29 DIAGNOSIS — I272 Other secondary pulmonary hypertension: Secondary | ICD-10-CM | POA: Diagnosis not present

## 2016-01-29 DIAGNOSIS — M1711 Unilateral primary osteoarthritis, right knee: Secondary | ICD-10-CM | POA: Diagnosis not present

## 2016-01-31 DIAGNOSIS — I69351 Hemiplegia and hemiparesis following cerebral infarction affecting right dominant side: Secondary | ICD-10-CM | POA: Diagnosis not present

## 2016-01-31 DIAGNOSIS — I272 Other secondary pulmonary hypertension: Secondary | ICD-10-CM | POA: Diagnosis not present

## 2016-01-31 DIAGNOSIS — M1711 Unilateral primary osteoarthritis, right knee: Secondary | ICD-10-CM | POA: Diagnosis not present

## 2016-01-31 DIAGNOSIS — W19XXXD Unspecified fall, subsequent encounter: Secondary | ICD-10-CM | POA: Diagnosis not present

## 2016-01-31 DIAGNOSIS — I4891 Unspecified atrial fibrillation: Secondary | ICD-10-CM | POA: Diagnosis not present

## 2016-01-31 DIAGNOSIS — K922 Gastrointestinal hemorrhage, unspecified: Secondary | ICD-10-CM | POA: Diagnosis not present

## 2016-02-05 DIAGNOSIS — W19XXXD Unspecified fall, subsequent encounter: Secondary | ICD-10-CM | POA: Diagnosis not present

## 2016-02-05 DIAGNOSIS — I69351 Hemiplegia and hemiparesis following cerebral infarction affecting right dominant side: Secondary | ICD-10-CM | POA: Diagnosis not present

## 2016-02-05 DIAGNOSIS — I4891 Unspecified atrial fibrillation: Secondary | ICD-10-CM | POA: Diagnosis not present

## 2016-02-05 DIAGNOSIS — M1711 Unilateral primary osteoarthritis, right knee: Secondary | ICD-10-CM | POA: Diagnosis not present

## 2016-02-05 DIAGNOSIS — K922 Gastrointestinal hemorrhage, unspecified: Secondary | ICD-10-CM | POA: Diagnosis not present

## 2016-02-05 DIAGNOSIS — I272 Other secondary pulmonary hypertension: Secondary | ICD-10-CM | POA: Diagnosis not present

## 2016-02-07 DIAGNOSIS — I4891 Unspecified atrial fibrillation: Secondary | ICD-10-CM | POA: Diagnosis not present

## 2016-02-07 DIAGNOSIS — I69351 Hemiplegia and hemiparesis following cerebral infarction affecting right dominant side: Secondary | ICD-10-CM | POA: Diagnosis not present

## 2016-02-07 DIAGNOSIS — I272 Other secondary pulmonary hypertension: Secondary | ICD-10-CM | POA: Diagnosis not present

## 2016-02-07 DIAGNOSIS — W19XXXD Unspecified fall, subsequent encounter: Secondary | ICD-10-CM | POA: Diagnosis not present

## 2016-02-07 DIAGNOSIS — M1711 Unilateral primary osteoarthritis, right knee: Secondary | ICD-10-CM | POA: Diagnosis not present

## 2016-02-07 DIAGNOSIS — K922 Gastrointestinal hemorrhage, unspecified: Secondary | ICD-10-CM | POA: Diagnosis not present

## 2016-02-09 DIAGNOSIS — H401132 Primary open-angle glaucoma, bilateral, moderate stage: Secondary | ICD-10-CM | POA: Diagnosis not present

## 2016-02-09 DIAGNOSIS — H5201 Hypermetropia, right eye: Secondary | ICD-10-CM | POA: Diagnosis not present

## 2016-02-09 DIAGNOSIS — H1851 Endothelial corneal dystrophy: Secondary | ICD-10-CM | POA: Diagnosis not present

## 2016-02-09 DIAGNOSIS — H52223 Regular astigmatism, bilateral: Secondary | ICD-10-CM | POA: Diagnosis not present

## 2016-02-09 DIAGNOSIS — H5212 Myopia, left eye: Secondary | ICD-10-CM | POA: Diagnosis not present

## 2016-02-09 DIAGNOSIS — H1850 Unspecified hereditary corneal dystrophies: Secondary | ICD-10-CM | POA: Diagnosis not present

## 2016-02-13 DIAGNOSIS — M1711 Unilateral primary osteoarthritis, right knee: Secondary | ICD-10-CM | POA: Diagnosis not present

## 2016-02-13 DIAGNOSIS — I69351 Hemiplegia and hemiparesis following cerebral infarction affecting right dominant side: Secondary | ICD-10-CM | POA: Diagnosis not present

## 2016-02-13 DIAGNOSIS — I4891 Unspecified atrial fibrillation: Secondary | ICD-10-CM | POA: Diagnosis not present

## 2016-02-13 DIAGNOSIS — W19XXXD Unspecified fall, subsequent encounter: Secondary | ICD-10-CM | POA: Diagnosis not present

## 2016-02-13 DIAGNOSIS — K922 Gastrointestinal hemorrhage, unspecified: Secondary | ICD-10-CM | POA: Diagnosis not present

## 2016-02-13 DIAGNOSIS — I272 Other secondary pulmonary hypertension: Secondary | ICD-10-CM | POA: Diagnosis not present

## 2016-02-15 DIAGNOSIS — M1711 Unilateral primary osteoarthritis, right knee: Secondary | ICD-10-CM | POA: Diagnosis not present

## 2016-02-15 DIAGNOSIS — I69351 Hemiplegia and hemiparesis following cerebral infarction affecting right dominant side: Secondary | ICD-10-CM | POA: Diagnosis not present

## 2016-02-15 DIAGNOSIS — W19XXXD Unspecified fall, subsequent encounter: Secondary | ICD-10-CM | POA: Diagnosis not present

## 2016-02-15 DIAGNOSIS — I272 Other secondary pulmonary hypertension: Secondary | ICD-10-CM | POA: Diagnosis not present

## 2016-02-15 DIAGNOSIS — K922 Gastrointestinal hemorrhage, unspecified: Secondary | ICD-10-CM | POA: Diagnosis not present

## 2016-02-15 DIAGNOSIS — I4891 Unspecified atrial fibrillation: Secondary | ICD-10-CM | POA: Diagnosis not present

## 2016-02-20 DIAGNOSIS — I4891 Unspecified atrial fibrillation: Secondary | ICD-10-CM | POA: Diagnosis not present

## 2016-02-20 DIAGNOSIS — W19XXXD Unspecified fall, subsequent encounter: Secondary | ICD-10-CM | POA: Diagnosis not present

## 2016-02-20 DIAGNOSIS — M1711 Unilateral primary osteoarthritis, right knee: Secondary | ICD-10-CM | POA: Diagnosis not present

## 2016-02-20 DIAGNOSIS — I272 Other secondary pulmonary hypertension: Secondary | ICD-10-CM | POA: Diagnosis not present

## 2016-02-20 DIAGNOSIS — K922 Gastrointestinal hemorrhage, unspecified: Secondary | ICD-10-CM | POA: Diagnosis not present

## 2016-02-20 DIAGNOSIS — I69351 Hemiplegia and hemiparesis following cerebral infarction affecting right dominant side: Secondary | ICD-10-CM | POA: Diagnosis not present

## 2016-02-23 ENCOUNTER — Encounter: Payer: Self-pay | Admitting: *Deleted

## 2016-02-26 NOTE — Progress Notes (Signed)
Cardiology Office Note   Date:  02/27/2016   ID:  Jillian Cantrell, DOB Jul 01, 1927, MRN [id number]  PCP:  Jillian Lyons, MD  Cardiologist:   Jillian Breeding, MD   No chief complaint on file.    History of Present Illness: Jillian Cantrell is a 82 y.o. female who presents for evaluation of atrial fibrillation. She was in the hospital in January with a fall. She had hematochezia. She had new onset atrial fibrillation. She was not treated with anticoagulation at that time. She did have GI evaluation. I have reviewed records and this demonstrated no evidence of the etiology of her bleed. She had a bleeding scan and CT. She's had follow-up with GI but I don't have outpatient records. We have not had any word from the gastroenterologists about her risk of recurrent bleeding should we choose to use anticoagulation. The patient lives alone walks with a cane. She is limited by hemiparesis. She has not had any recent falls. She does not have any new complaints other than the fact that she falls asleep quickly when she lies down. She denies any other cardiovascular symptoms. She does not report shortness of breath, PND or orthopnea. She's not had palpitations, presyncope or syncope. She denies any chest pressure, neck or arm discomfort.  Past Medical History  Diagnosis Date  . Stroke [facility]) 2003    Chronic right-sided weakness  . Hypertension   . Thyroid disease   . Cancer [facility])     thyroid cancer s/p thyroidectomy  . Atrial fibrillation with normal ventricular rate ([place]) 10/2015    Rate control, no anticoagulation secondary to GI bleed  . Iatrogenic hypothyroidism     Past Surgical History  Procedure Laterality Date  . Cholecystectomy    . Abdominal hysterectomy    . Appendectomy    . Varicose vein surgery    . Thyroidectomy      Current Outpatient Prescriptions  Medication Sig Dispense Refill  . aspirin 81 MG EC tablet Take 81 mg by mouth daily. Swallow whole.    .  bisoprolol-hydrochlorothiazide (ZIAC) 2.5-6.25 MG per tablet Take 1 tablet by mouth daily.    Marland Kitchen diltiazem (CARDIZEM) 120 MG tablet Take 0.5 tablets (60 mg total) by mouth 2 (two) times daily.    Marland Kitchen levothyroxine (SYNTHROID, LEVOTHROID) 150 MCG tablet Take 1 tablet (150 mcg total) by mouth daily before breakfast.    . simvastatin (ZOCOR) 20 MG tablet Take 20 mg by mouth daily.     No current facility-administered medications for this visit.    Allergies:   Codeine    ROS:  Please see the history of present illness.   Otherwise, review of systems are positive for none.   All other systems are reviewed and negative.    PHYSICAL EXAM: VS:  BP 122/60 mmHg  Pulse 89  Ht 5\' 8"  (1.727 m)  Wt 180 lb (81.647 kg)  BMI 27.38 kg/m2 , BMI Body mass index is 27.38 kg/(m^2). GENERAL:  Well appearing but frail.  HEENT:  Pupils equal round and reactive, fundi not visualized, oral mucosa unremarkable NECK:  No jugular venous distention at 90 degrees. , waveform within normal limits, carotid upstroke brisk and symmetric, no bruits, no thyromegaly LYMPHATICS:  No cervical, inguinal adenopathy LUNGS:  Clear to auscultation bilaterally BACK:  No CVA tenderness CHEST:  Unremarkable HEART:  PMI not displaced or sustained,S1 and S2 within normal limits, no S3, no clicks, no rubs, no murmurs, irregular ABD:  Flat, positive bowel  sounds normal in frequency in pitch, no bruits, no rebound, no guarding, exam limited with the patient seated EXT:  2 plus pulses throughout, right greater than left leg edema, no cyanosis no clubbing SKIN:  No rashes no nodules NEURO:  Right hemiparesis.  PSYCH:  Cognitively intact, oriented to person place and time    EKG:  EKG is not ordered today.   Recent Labs: 11/01/2015: ALT 9*; TSH 0.030* 11/05/2015: Magnesium 1.8 11/07/2015: BUN 15; Creatinine, Ser 0.83; Hemoglobin 8.6*; Platelets 250; Potassium 3.5; Sodium 136    Lipid Panel No results found for: CHOL, TRIG, HDL,  CHOLHDL, VLDL, LDLCALC, LDLDIRECT    Wt Readings from Last 3 Encounters:  02/27/16 180 lb (81.647 kg)  11/22/15 172 lb 1 oz (78.047 kg)  11/01/15 172 lb 13.5 oz (78.4 kg)      Other studies Reviewed: Additional studies/ records that were reviewed today include: Hospital records. Review of the above records demonstrates:  Please see elsewhere in the note.     ASSESSMENT AND PLAN:   Atrial fibrillation, controlled ventricular response:   [name] has a CHA2DS2 - VASc score of 6.  Given this high risk of stroke we need to continue to have a discussion about whether she would be an anticoagulation candidate. I have left a message for Dr. Collene Mares to help address this issue. For now she'll remain on the meds as listed.  GI bleed:    She has had no evidence of active bleeding.    Volume overload: She has mild lower extremity edema .  Otherwise she has no symptoms related to volume.  She will remain on the meds as listed.     Current medicines are reviewed at length with the patient today.  The patient does not have concerns regarding medicines.  The following changes have been made:  no change  Labs/ tests ordered today include:  No orders of the defined types were placed in this encounter.     Disposition:   FU with me in six months.      Signed, Jillian Breeding, MD  02/27/2016 5:46 PM    [facility]

## 2016-02-27 ENCOUNTER — Ambulatory Visit (INDEPENDENT_AMBULATORY_CARE_PROVIDER_SITE_OTHER): Payer: Medicare Other | Admitting: Cardiology

## 2016-02-27 ENCOUNTER — Encounter: Payer: Self-pay | Admitting: Cardiology

## 2016-02-27 VITALS — BP 122/60 | HR 89 | Ht 68.0 in | Wt 180.0 lb

## 2016-02-27 DIAGNOSIS — I482 Chronic atrial fibrillation, unspecified: Secondary | ICD-10-CM

## 2016-02-27 NOTE — Patient Instructions (Signed)
Your physician wants you to follow-up in: 6 Months You will receive a reminder letter in the mail two months in advance. If you don't receive a letter, please call our office to schedule the follow-up appointment.  

## 2016-03-18 ENCOUNTER — Ambulatory Visit (INDEPENDENT_AMBULATORY_CARE_PROVIDER_SITE_OTHER): Payer: Medicare Other | Admitting: Ophthalmology

## 2016-03-28 ENCOUNTER — Ambulatory Visit (INDEPENDENT_AMBULATORY_CARE_PROVIDER_SITE_OTHER): Payer: Medicare Other | Admitting: Ophthalmology

## 2016-03-28 DIAGNOSIS — H43812 Vitreous degeneration, left eye: Secondary | ICD-10-CM

## 2016-03-28 DIAGNOSIS — H35373 Puckering of macula, bilateral: Secondary | ICD-10-CM

## 2016-03-28 DIAGNOSIS — I10 Essential (primary) hypertension: Secondary | ICD-10-CM | POA: Diagnosis not present

## 2016-03-28 DIAGNOSIS — H35033 Hypertensive retinopathy, bilateral: Secondary | ICD-10-CM

## 2016-05-30 ENCOUNTER — Encounter (HOSPITAL_COMMUNITY): Payer: Self-pay | Admitting: *Deleted

## 2016-05-30 ENCOUNTER — Inpatient Hospital Stay (HOSPITAL_COMMUNITY)
Admission: EM | Admit: 2016-05-30 | Discharge: 2016-06-01 | DRG: 690 | Disposition: A | Discharge status: Home / Self Care | Payer: Medicare Other | Attending: Internal Medicine | Admitting: Internal Medicine

## 2016-05-30 ENCOUNTER — Emergency Department (HOSPITAL_COMMUNITY): Payer: Medicare Other

## 2016-05-30 ENCOUNTER — Emergency Department (HOSPITAL_COMMUNITY)
Admit: 2016-05-30 | Discharge: 2016-05-30 | Disposition: A | Discharge status: Home / Self Care | Payer: Medicare Other | Attending: Emergency Medicine | Admitting: Emergency Medicine

## 2016-05-30 DIAGNOSIS — E785 Hyperlipidemia, unspecified: Secondary | ICD-10-CM | POA: Diagnosis present

## 2016-05-30 DIAGNOSIS — I69351 Hemiplegia and hemiparesis following cerebral infarction affecting right dominant side: Secondary | ICD-10-CM

## 2016-05-30 DIAGNOSIS — Z8673 Personal history of transient ischemic attack (TIA), and cerebral infarction without residual deficits: Secondary | ICD-10-CM

## 2016-05-30 DIAGNOSIS — N132 Hydronephrosis with renal and ureteral calculous obstruction: Secondary | ICD-10-CM | POA: Diagnosis present

## 2016-05-30 DIAGNOSIS — M549 Dorsalgia, unspecified: Secondary | ICD-10-CM | POA: Diagnosis not present

## 2016-05-30 DIAGNOSIS — Z87440 Personal history of urinary (tract) infections: Secondary | ICD-10-CM

## 2016-05-30 DIAGNOSIS — Z806 Family history of leukemia: Secondary | ICD-10-CM | POA: Diagnosis not present

## 2016-05-30 DIAGNOSIS — Z87891 Personal history of nicotine dependence: Secondary | ICD-10-CM

## 2016-05-30 DIAGNOSIS — N390 Urinary tract infection, site not specified: Secondary | ICD-10-CM | POA: Diagnosis present

## 2016-05-30 DIAGNOSIS — K922 Gastrointestinal hemorrhage, unspecified: Secondary | ICD-10-CM | POA: Diagnosis present

## 2016-05-30 DIAGNOSIS — Z885 Allergy status to narcotic agent status: Secondary | ICD-10-CM | POA: Diagnosis not present

## 2016-05-30 DIAGNOSIS — N1330 Unspecified hydronephrosis: Secondary | ICD-10-CM | POA: Diagnosis not present

## 2016-05-30 DIAGNOSIS — I4891 Unspecified atrial fibrillation: Secondary | ICD-10-CM | POA: Diagnosis present

## 2016-05-30 DIAGNOSIS — R0602 Shortness of breath: Secondary | ICD-10-CM | POA: Diagnosis not present

## 2016-05-30 DIAGNOSIS — Z85850 Personal history of malignant neoplasm of thyroid: Secondary | ICD-10-CM | POA: Diagnosis not present

## 2016-05-30 DIAGNOSIS — B9620 Unspecified Escherichia coli [E. coli] as the cause of diseases classified elsewhere: Secondary | ICD-10-CM | POA: Diagnosis present

## 2016-05-30 DIAGNOSIS — Z7982 Long term (current) use of aspirin: Secondary | ICD-10-CM

## 2016-05-30 DIAGNOSIS — Z79899 Other long term (current) drug therapy: Secondary | ICD-10-CM

## 2016-05-30 DIAGNOSIS — I10 Essential (primary) hypertension: Secondary | ICD-10-CM | POA: Diagnosis present

## 2016-05-30 DIAGNOSIS — N3080 Other cystitis without hematuria: Secondary | ICD-10-CM | POA: Diagnosis not present

## 2016-05-30 DIAGNOSIS — E032 Hypothyroidism due to medicaments and other exogenous substances: Secondary | ICD-10-CM | POA: Diagnosis present

## 2016-05-30 DIAGNOSIS — M7989 Other specified soft tissue disorders: Secondary | ICD-10-CM

## 2016-05-30 LAB — CBC
HEMATOCRIT: 47.1 % — AB (ref 36.0–46.0)
HEMOGLOBIN: 15.3 g/dL — AB (ref 12.0–15.0)
MCH: 30.2 pg (ref 26.0–34.0)
MCHC: 32.5 g/dL (ref 30.0–36.0)
MCV: 92.9 fL (ref 78.0–100.0)
Platelets: 223 10*3/uL (ref 150–400)
RBC: 5.07 MIL/uL (ref 3.87–5.11)
RDW: 16.4 % — AB (ref 11.5–15.5)
WBC: 7.7 10*3/uL (ref 4.0–10.5)

## 2016-05-30 LAB — URINE MICROSCOPIC-ADD ON

## 2016-05-30 LAB — COMPREHENSIVE METABOLIC PANEL
ALBUMIN: 3.9 g/dL (ref 3.5–5.0)
ALK PHOS: 109 U/L (ref 38–126)
ALT: 12 U/L — ABNORMAL LOW (ref 14–54)
ANION GAP: 9 (ref 5–15)
AST: 20 U/L (ref 15–41)
BILIRUBIN TOTAL: 0.7 mg/dL (ref 0.3–1.2)
BUN: 10 mg/dL (ref 6–20)
CALCIUM: 10 mg/dL (ref 8.9–10.3)
CO2: 27 mmol/L (ref 22–32)
Chloride: 103 mmol/L (ref 101–111)
Creatinine, Ser: 0.98 mg/dL (ref 0.44–1.00)
GFR, EST AFRICAN AMERICAN: 59 mL/min — AB (ref >60)
GFR, EST NON AFRICAN AMERICAN: 51 mL/min — AB (ref >60)
GLUCOSE: 102 mg/dL — AB (ref 65–99)
Potassium: 3.9 mmol/L (ref 3.5–5.1)
Sodium: 139 mmol/L (ref 135–145)
TOTAL PROTEIN: 6.9 g/dL (ref 6.5–8.1)

## 2016-05-30 LAB — URINALYSIS, ROUTINE W REFLEX MICROSCOPIC
BILIRUBIN URINE: NEGATIVE
GLUCOSE, UA: NEGATIVE mg/dL
Ketones, ur: NEGATIVE mg/dL
Nitrite: POSITIVE — AB
PH: 5.5 (ref 5.0–8.0)
Protein, ur: NEGATIVE mg/dL
SPECIFIC GRAVITY, URINE: 1.015 (ref 1.005–1.030)

## 2016-05-30 MED ORDER — DEXTROSE 5 % IV SOLN
1.0000 g | INTRAVENOUS | Status: DC
  Administered 2016-05-31: 1 g via INTRAVENOUS
  Filled 2016-05-30 (×2): qty 10

## 2016-05-30 MED ORDER — ACETAMINOPHEN 325 MG PO TABS
650.0000 mg | ORAL_TABLET | Freq: Four times a day (QID) | ORAL | Status: DC | PRN
  Administered 2016-05-31 (×2): 650 mg via ORAL
  Filled 2016-05-30 (×2): qty 2

## 2016-05-30 MED ORDER — ACETAMINOPHEN 650 MG RE SUPP: 650.0000 mg | Freq: Four times a day (QID) | RECTAL | Status: DC | PRN

## 2016-05-30 MED ORDER — LATANOPROST 0.005 % OP SOLN
1.0000 [drp] | Freq: Every day | OPHTHALMIC | Status: DC
  Administered 2016-05-30 – 2016-05-31 (×2): 1 [drp] via OPHTHALMIC
  Filled 2016-05-30: qty 2.5

## 2016-05-30 MED ORDER — SIMVASTATIN 20 MG PO TABS
20.0000 mg | ORAL_TABLET | Freq: Every day | ORAL | Status: DC
  Administered 2016-05-30 – 2016-06-01 (×3): 20 mg via ORAL
  Filled 2016-05-30 (×3): qty 1

## 2016-05-30 MED ORDER — DILTIAZEM HCL 60 MG PO TABS
60.0000 mg | ORAL_TABLET | Freq: Two times a day (BID) | ORAL | Status: DC
Start: 2016-05-30 — End: 2016-06-01
  Administered 2016-05-30 – 2016-06-01 (×4): 60 mg via ORAL
  Filled 2016-05-30 (×5): qty 1

## 2016-05-30 MED ORDER — ASPIRIN EC 81 MG PO TBEC
81.0000 mg | DELAYED_RELEASE_TABLET | Freq: Every day | ORAL | Status: DC
  Administered 2016-05-31 – 2016-06-01 (×2): 81 mg via ORAL
  Filled 2016-05-30 (×2): qty 1

## 2016-05-30 MED ORDER — TRAMADOL HCL 50 MG PO TABS
50.0000 mg | ORAL_TABLET | Freq: Four times a day (QID) | ORAL | Status: DC | PRN
  Administered 2016-05-30 – 2016-05-31 (×2): 50 mg via ORAL
  Filled 2016-05-30 (×2): qty 1

## 2016-05-30 MED ORDER — ONDANSETRON HCL 4 MG/2ML IJ SOLN: 4.0000 mg | Freq: Four times a day (QID) | INTRAMUSCULAR | Status: DC | PRN

## 2016-05-30 MED ORDER — MORPHINE SULFATE (PF) 4 MG/ML IV SOLN: 4.0000 mg | Freq: Once | INTRAVENOUS | Status: DC

## 2016-05-30 MED ORDER — ONDANSETRON HCL 4 MG PO TABS: 4.0000 mg | ORAL_TABLET | Freq: Four times a day (QID) | ORAL | Status: DC | PRN

## 2016-05-30 MED ORDER — DEXTROSE 5 % IV SOLN
1.0000 g | Freq: Once | INTRAVENOUS | Status: AC
  Administered 2016-05-30: 1 g via INTRAVENOUS
  Filled 2016-05-30: qty 10

## 2016-05-30 MED ORDER — ENOXAPARIN SODIUM 40 MG/0.4ML ~~LOC~~ SOLN
40.0000 mg | SUBCUTANEOUS | Status: DC
  Administered 2016-05-30 – 2016-05-31 (×2): 40 mg via SUBCUTANEOUS
  Filled 2016-05-30 (×2): qty 0.4

## 2016-05-30 MED ORDER — BISOPROLOL FUMARATE 5 MG PO TABS
2.5000 mg | ORAL_TABLET | Freq: Every day | ORAL | Status: DC
  Administered 2016-05-31 – 2016-06-01 (×2): 2.5 mg via ORAL
  Filled 2016-05-30 (×2): qty 1

## 2016-05-30 MED ORDER — LEVOTHYROXINE SODIUM 75 MCG PO TABS
150.0000 ug | ORAL_TABLET | Freq: Every day | ORAL | Status: DC
  Administered 2016-05-31 – 2016-06-01 (×2): 150 ug via ORAL
  Filled 2016-05-30 (×2): qty 2

## 2016-05-30 NOTE — ED Notes (Signed)
Patient transported to CT 

## 2016-05-30 NOTE — ED Notes (Signed)
Pt refused pain meds.

## 2016-05-30 NOTE — H&P (Signed)
History and Physical    Jillian Cantrell [id number] DOB: 1927/01/14 DOA: 05/30/2016  PCP: Jillian Lyons, MD  Patient coming from: Home  Chief Complaint: Left flank pain  HPI: Jillian Cantrell is a 82 y.o. female with medical history significant of cerebrovascular accident, history of atrial fibrillation, admitted in January 2017 for GI bleed, history of urinary tract infections, presenting to the emergency department with complaints of left flank pain. She reports waking at this morning at approximately 1 am with severe left-sided flank pain, worse with urination. Continued to experience pain over the course of the evening. She denies fevers, chills, shortness of breath, chest pain, nausea, vomiting, dysuria, hematuria.  ED Course: Workup in the emergency department included CT scan which revealed mild left-sided hydronephrosis with no obstructive stone identified. Radiology reported small focus of gas within the bladder lumen concerning for emphysematous cystitis. Urinalysis showing the presence of many bacteria.   Review of Systems: As per HPI otherwise 10 point review of systems negative.    Past Medical History:  Diagnosis Date  . Atrial fibrillation with normal ventricular rate ([place]) 10/2015   Rate control, no anticoagulation secondary to GI bleed  . Cancer [facility])    thyroid cancer s/p thyroidectomy  . Hypertension   . Iatrogenic hypothyroidism   . Stroke [facility]) 2003   Chronic right-sided weakness  . Thyroid disease     Past Surgical History:  Procedure Laterality Date  . ABDOMINAL HYSTERECTOMY    . APPENDECTOMY    . CHOLECYSTECTOMY    . THYROIDECTOMY    . VARICOSE VEIN SURGERY       reports that she quit smoking about 47 years ago. She has never used smokeless tobacco. She reports that she does not drink alcohol or use drugs.  Allergies  Allergen Reactions  . Codeine Hives    Family History  Problem Relation Age of Onset  . Cancer Father     leukemia      Prior to Admission medications   Medication Sig Start Date End Date Taking? Authorizing Provider  aspirin 81 MG EC tablet Take 81 mg by mouth daily. Swallow whole.   Yes Historical Provider, MD  bisoprolol-hydrochlorothiazide (ZIAC) 2.5-6.25 MG per tablet Take 1 tablet by mouth daily.   Yes Historical Provider, MD  diltiazem (CARDIZEM) 120 MG tablet Take 0.5 tablets (60 mg total) by mouth 2 (two) times daily. 11/07/15  Yes Verlee Monte, MD  latanoprost (XALATAN) 0.005 % ophthalmic solution Place 1 drop into both eyes at bedtime.   Yes Historical Provider, MD  levothyroxine (SYNTHROID, LEVOTHROID) 150 MCG tablet Take 1 tablet (150 mcg total) by mouth daily before breakfast. 11/07/15  Yes Verlee Monte, MD  simvastatin (ZOCOR) 20 MG tablet Take 20 mg by mouth daily.   Yes Historical Provider, MD    Physical Exam: Vitals:   05/30/16 1148 05/30/16 1300 05/30/16 1315 05/30/16 1330  BP: 141/75 150/77 162/96 139/63  Pulse: 60 63 65 (!) 127  Resp: 17 12 18 17   Temp: 98 F (36.7 C)     TempSrc: Oral     SpO2: 96% 95% 97% 97%      Constitutional: NAD, calm, comfortable, she is in no acute distress Vitals:   05/30/16 1148 05/30/16 1300 05/30/16 1315 05/30/16 1330  BP: 141/75 150/77 162/96 139/63  Pulse: 60 63 65 (!) 127  Resp: 17 12 18 17   Temp: 98 F (36.7 C)     TempSrc: Oral     SpO2: 96% 95% 97%  97%   Eyes: PERRL, lids and conjunctivae normal ENMT: Mucous membranes are moist. Posterior pharynx clear of any exudate or lesions.Normal dentition.  Neck: normal, supple, no masses, no thyromegaly Respiratory: clear to auscultation bilaterally, no wheezing, no crackles. Normal respiratory effort. No accessory muscle use.  Cardiovascular: Regular rate and rhythm, no murmurs / rubs / gallops. No extremity edema. 2+ pedal pulses. No carotid bruits.  Abdomen: On abdominal exam she did not have guarding or rebound tenderness, there was no pain to palpation over lower abdominal region, did  not appreciate costovertebral tenderness either.  Musculoskeletal: no clubbing / cyanosis. No joint deformity upper and lower extremities. Good ROM, no contractures. Normal muscle tone.  Skin: no rashes, lesions, ulcers. No induration Neurologic: CN 2-12 grossly intact. Sensation intact, DTR normal. Strength 5/5 in all 4.  Psychiatric: Normal judgment and insight. Alert and oriented x 3. Normal mood.     Labs on Admission: I have personally reviewed following labs and imaging studies  CBC:  Recent Labs Lab 05/30/16 1155  WBC 7.7  HGB 15.3*  HCT 47.1*  MCV 92.9  PLT [id number]   Basic Metabolic Panel:  Recent Labs Lab 05/30/16 1155  NA 139  K 3.9  CL 103  CO2 27  GLUCOSE 102*  BUN 10  CREATININE 0.98  CALCIUM 10.0   GFR: CrCl cannot be calculated (Unknown ideal weight.). Liver Function Tests:  Recent Labs Lab 05/30/16 1155  AST 20  ALT 12*  ALKPHOS 109  BILITOT 0.7  PROT 6.9  ALBUMIN 3.9   No results for input(s): LIPASE, AMYLASE in the last 168 hours. No results for input(s): AMMONIA in the last 168 hours. Coagulation Profile: No results for input(s): INR, PROTIME in the last 168 hours. Cardiac Enzymes: No results for input(s): CKTOTAL, CKMB, CKMBINDEX, TROPONINI in the last 168 hours. BNP (last 3 results) No results for input(s): PROBNP in the last 8760 hours. HbA1C: No results for input(s): HGBA1C in the last 72 hours. CBG: No results for input(s): GLUCAP in the last 168 hours. Lipid Profile: No results for input(s): CHOL, HDL, LDLCALC, TRIG, CHOLHDL, LDLDIRECT in the last 72 hours. Thyroid Function Tests: No results for input(s): TSH, T4TOTAL, FREET4, T3FREE, THYROIDAB in the last 72 hours. Anemia Panel: No results for input(s): VITAMINB12, FOLATE, FERRITIN, TIBC, IRON, RETICCTPCT in the last 72 hours. Urine analysis:    Component Value Date/Time   COLORURINE YELLOW 05/30/2016 1244   APPEARANCEUR CLOUDY (A) 05/30/2016 1244   LABSPEC 1.015 05/30/2016  1244   PHURINE 5.5 05/30/2016 [place] 05/30/2016 1244   HGBUR MODERATE (A) 05/30/2016 1244   BILIRUBINUR NEGATIVE 05/30/2016 1244   KETONESUR NEGATIVE 05/30/2016 1244   PROTEINUR NEGATIVE 05/30/2016 1244   NITRITE POSITIVE (A) 05/30/2016 1244   LEUKOCYTESUR TRACE (A) 05/30/2016 1244   Sepsis Labs: !!!!!!!!!!!!!!!!!!!!!!!!!!!!!!!!!!!!!!!!!!!! @LABRCNTIP (procalcitonin:4,lacticidven:4) )No results found for this or any previous visit (from the past 240 hour(s)).   Radiological Exams on Admission: Dg Chest 2 View  Result Date: 05/30/2016 CLINICAL DATA:  82 year old female with increase shortness of breath, left flank pain and weakness. Initial encounter. EXAM: CHEST  2 VIEW COMPARISON:  CT Abdomen and Pelvis 1432 hours today. Chest abdomen pelvis CT 01/07/2015 and earlier. FINDINGS: Chronic cardiomegaly. Calcified aortic atherosclerosis. Other mediastinal contours are within normal limits. Sequelae of left thyroidectomy. One of the patient arms is not raised on the lateral view which limits its utility. No pneumothorax or pulmonary edema. No pleural effusion or confluent pulmonary opacity. No acute  osseous abnormality identified. Advanced degenerative changes at the left shoulder again noted. IMPRESSION: 1.  No acute cardiopulmonary abnormality. 2. Cardiomegaly.  Calcified aortic atherosclerosis. Electronically Signed   By: Genevie Ann M.D.   On: 05/30/2016 15:38   Ct Renal Stone Study  Result Date: 05/30/2016 CLINICAL DATA:  Initial evaluation for acute left flank pain. EXAM: CT ABDOMEN AND PELVIS WITHOUT CONTRAST TECHNIQUE: Multidetector CT imaging of the abdomen and pelvis was performed following the standard protocol without IV contrast. COMPARISON:  Prior CT from 11/01/2015. FINDINGS: Scattered bibasilar atelectasis/scarring present within the visualized lung bases. Visualized lung bases are otherwise clear. The cardiomegaly partially visualized. Scattered coronary artery  calcifications with aortic and mitral valvular calcifications. No pleural or pericardial fusion. Limited noncontrast evaluation of the liver is unremarkable. Gallbladder is absent. No biliary dilatation. Spleen within normal limits on this noncontrast examination. Known splenic lesions not well seen. Adrenal glands and pancreas within normal limits. The mild left-sided hydronephrosis present. No radiopaque calculi seen within the left kidney. No radiopaque stones seen along the course of the left ureter. Finding raises the possibility for AE recently passed stone. Possible infection could also be considered. Extrarenal pelvis present on the right without frank hydronephrosis. No right-sided renal calculi. No radiopaque calculi seen along the course of the right ureter. There is no hydroureter. Stomach within normal limits. No evidence for bowel obstruction. No abnormal wall thickening or inflammatory fat stranding seen about the bowels. Colonic diverticulosis without evidence for acute diverticulitis. Bladder well distended. No layering stones within the lateral lumen. Small focus of gas within the bladder lumen may be related to recent intervention. The Uterus is absent.  Ovaries not discretely identified. Fat and bowel containing right inguinal hernia noted without associated obstruction. No free air or fluid. No adenopathy. Moderate aorto bi-iliac atherosclerotic disease. No aneurysm. No acute osseous abnormality. No worrisome lytic or blastic osseous lesions. IMPRESSION: 1. Mild left-sided hydronephrosis with no obstructive radiopaque stone identified. Finding raises the possibility for a recently passed stone. Possible infection could also be considered. Correlation with urinalysis recommended. No other radiopaque calculi identified. 2. No other acute intra-abdominal or pelvic process. 3. Colonic diverticulosis without evidence for acute diverticulitis. 4. Small focus of gas within the bladder lumen. Question  recent intervention/catheterization. 5. Fat and small bowel containing right inguinal hernia without associated obstruction or inflammation. Electronically Signed   By: Jeannine Boga M.D.   On: 05/30/2016 15:10    EKG: Independently reviewed.   Assessment/Plan Active Problems:   History of CVA (cerebrovascular accident)   Atrial fibrillation with normal ventricular rate ([facility])   Emphysematous cystitis   UTI (lower urinary tract infection)   1.  Left flank pain secondary to nephrolithiasis. Mrs Beissel is an 82 year old female presenting with complaints of severe left flank pain that occurred this morning at 1:00. CT scan performed in the emergency room showing evidence of mild left-sided hydronephrosis without  a stone identified, suspect she may have passed a stone overnight.  2.  Suspect emphysematous cystitis/UTI. Radiology reporting a small focus of gas within the bladder lumen which could be consistent with emphysematous cystitis. Will treat with parenteral IV antibiotics. Start ceftriaxone 1 g IV every 24 hours. Urinalysis revealed the presence of many bacteria, nitrates and white blood cells. She is otherwise hemodynamically stable, nontoxic appearing. Well up on urine cultures. Urine cultures from January 2017 grew Escherichia coli which appeared to be pan-susceptible.  3.  History of atrial fibrillation. She is not candidate for anticoagulation due  to GI bleed in January 2017, continue aspirin 81 mg by mouth daily. Will continue Cardizem 60 mg by mouth twice a day.  4.  History of hypothyroidism. Continue Synthroid on a 50 g by mouth daily  5.  History of CVA. She has a known history of atrial fibrillation, however not candidate for anticoagulation given history of GI bleed in January 2017. Continue antiplatelet therapy with aspirin along with statin.   DVT prophylaxis: Lovenox Code Status: Full code Family Communication: I called her son Tameaka Cannoy [phone], left  him a voicemail Disposition Plan: Will admit for parenteral antibiotic therapy, anticipate she may require greater than 2 nights hospitalization Consults called:  Admission status: Inpatient   Kelvin Cellar MD [facility] Pager [phone]  If 7PM-7AM, please contact night-coverage [email or web address] Password [facility]  05/30/2016, 4:22 PM

## 2016-05-30 NOTE — ED Triage Notes (Signed)
Brought to ED for eval of increased SOB, left flank pain, and increased weakness. States the flank pain woke her in the middle of the night and is now intermittent. No vomiting but was nauseated. Last BM was yesterday and normal for pt. No hx of kidney stone. Pt denies difficulty with urination. Increased swelling to legs since being taken off Lasix (unknown time).

## 2016-05-30 NOTE — ED Provider Notes (Signed)
[place] DEPT Provider Note   CSN: [id number] Arrival date & time: 05/30/16  1123     History   Chief Complaint Chief Complaint  Patient presents with  . Back Pain  . Weakness    HPI Jillian Cantrell is a 82 y.o. female.  HPI History of CVA (persistent R sided weakness), Thyroid cancer s/p thyroidectomy, A-fib that's rate controlled (no AC due to GI bleed in 2017), last echo 11/01/15 with no CHFpresents for back pain.  She reports awakening from sleep this morning with left-sided back pain. She is unable to describe the quality of the pain, denies radiation. It was 10 out of 10. She reports getting up to use the bathroom, the pain got worse. The patient has been off and on throughout the night. She reports feeling weak due to pain. Felt nauseated this morning, no vomiting. Denies fevers, chills, recent illnesses, chest pain, abdominal pain. She reports constipation this week, but had a bowel movement yesterday. Denies any urinary symptoms.  Past Medical History:  Diagnosis Date  . Atrial fibrillation with normal ventricular rate ([place]) 10/2015   Rate control, no anticoagulation secondary to GI bleed  . Cancer [facility])    thyroid cancer s/p thyroidectomy  . Hypertension   . Iatrogenic hypothyroidism   . Stroke [facility]) 2003   Chronic right-sided weakness  . Thyroid disease     Patient Active Problem List   Diagnosis Date Noted  . Atrial fibrillation with normal ventricular rate ([place])   . Thyroid disease   . Iatrogenic hypothyroidism   . Atrial fibrillation ([place])   . Hypothyroidism 11/05/2015  . Rectal bleeding   . Acute blood loss anemia 11/02/2015  . GI bleed 11/01/2015  . Fall 11/01/2015  . Physical deconditioning 11/01/2015  . Atrial fibrillation, new onset ([place]) 11/01/2015  . Hypotension 11/01/2015  . History of CVA (cerebrovascular accident) 11/01/2015  . HLD (hyperlipidemia)     Past Surgical History:  Procedure Laterality Date  . ABDOMINAL HYSTERECTOMY      . APPENDECTOMY    . CHOLECYSTECTOMY    . THYROIDECTOMY    . VARICOSE VEIN SURGERY      OB History    No data available       Home Medications    Prior to Admission medications   Medication Sig Start Date End Date Taking? Authorizing Provider  aspirin 81 MG EC tablet Take 81 mg by mouth daily. Swallow whole.    Historical Provider, MD  bisoprolol-hydrochlorothiazide [facility]) 2.5-6.25 MG per tablet Take 1 tablet by mouth daily.    Historical Provider, MD  diltiazem (CARDIZEM) 120 MG tablet Take 0.5 tablets (60 mg total) by mouth 2 (two) times daily. 11/07/15   Verlee Monte, MD  levothyroxine (SYNTHROID, LEVOTHROID) 150 MCG tablet Take 1 tablet (150 mcg total) by mouth daily before breakfast. 11/07/15   Verlee Monte, MD  simvastatin (ZOCOR) 20 MG tablet Take 20 mg by mouth daily.    Historical Provider, MD    Family History Family History  Problem Relation Age of Onset  . Cancer Father     leukemia    Social History Social History  Substance Use Topics  . Smoking status: Former Smoker    Quit date: 10/14/1968  . Smokeless tobacco: Never Used  . Alcohol use No     Allergies   Codeine   Review of Systems Review of Systems  Constitutional: Positive for fatigue. Negative for chills and fever.  HENT: Negative for ear pain and sore throat.  Eyes: Negative for pain and visual disturbance.  Respiratory: Negative for cough and shortness of breath.   Cardiovascular: Negative for chest pain and palpitations.  Gastrointestinal: Positive for constipation and nausea. Negative for abdominal pain and vomiting.  Genitourinary: Positive for flank pain. Negative for dysuria and hematuria.  Musculoskeletal: Negative for arthralgias.  Skin: Negative for color change and rash.  Neurological: Negative for seizures and syncope.  All other systems reviewed and are negative.    Physical Exam Updated Vital Signs BP 141/75 (BP Location: Left Arm)   Pulse 60   Temp 98 F (36.7 C) (Oral)    Resp 17   SpO2 96%   Physical Exam  Constitutional: She appears well-developed and well-nourished. No distress.  HENT:  Head: Normocephalic and atraumatic.  Eyes: Conjunctivae are normal.  Neck: Neck supple.  Cardiovascular: Normal rate and regular rhythm.   No murmur heard. Pulmonary/Chest: Effort normal and breath sounds normal. No respiratory distress.  Abdominal: Soft. There is no tenderness.  Musculoskeletal: She exhibits tenderness (along left paraspinal muscles, no midline spine tenderness). She exhibits no edema.  Contractures of right hand with RUE and RLE weakness that is reportedly baseline (per patient and family).  Neurological: She is alert.  Skin: Skin is warm and dry.  Psychiatric: She has a normal mood and affect.  Nursing note and vitals reviewed.    ED Treatments / Results  Labs (all labs ordered are listed, but only abnormal results are displayed) Labs Reviewed  URINE CULTURE  CBC  URINALYSIS, ROUTINE W REFLEX MICROSCOPIC (NOT AT [facility])  COMPREHENSIVE METABOLIC PANEL    EKG  EKG Interpretation None       Radiology No results found.  Procedures Procedures (including critical care time)  Medications Ordered in ED Medications - No data to display   Initial Impression / Assessment and Plan / ED Course  I have reviewed the triage vital signs and the nursing notes.  Pertinent labs & imaging results that were available during my care of the patient were reviewed by me and considered in my medical decision making (see chart for details).  Clinical Course    Colicky left sided back pain raises concern for muscle spasm vs nephrolithiasis vs constipation vs intraabdominal emergency otherwise.  Labs, imaging ordered.  UA shows UTI.  No signs of systemic toxicity (no fever, tachycardia, leukocytosis).  CT abd/pelvis showed left sided mild hydronephrosis without obstruction (concerning for passed stone) and air in the bladder lumen, concerning for  emphasematous cystitis.  IV abx ordered.  CXR unremarkable.  DVT scan of bilateral lower extremity ordered. Given asymmetric swelling.  Patient to be admitted.    Final Clinical Impressions(s) / ED Diagnoses   Final diagnoses:  None    New Prescriptions New Prescriptions   No medications on file     Levada Schilling, MD 05/30/16 1603    Ezequiel Essex, MD 05/30/16 1730

## 2016-05-31 DIAGNOSIS — I4891 Unspecified atrial fibrillation: Secondary | ICD-10-CM

## 2016-05-31 DIAGNOSIS — N3080 Other cystitis without hematuria: Principal | ICD-10-CM

## 2016-05-31 DIAGNOSIS — N390 Urinary tract infection, site not specified: Secondary | ICD-10-CM

## 2016-05-31 LAB — BASIC METABOLIC PANEL
Anion gap: 10 (ref 5–15)
BUN: 11 mg/dL (ref 6–20)
CALCIUM: 9.4 mg/dL (ref 8.9–10.3)
CO2: 28 mmol/L (ref 22–32)
Chloride: 102 mmol/L (ref 101–111)
Creatinine, Ser: 0.99 mg/dL (ref 0.44–1.00)
GFR calc Af Amer: 58 mL/min — ABNORMAL LOW (ref >60)
GFR, EST NON AFRICAN AMERICAN: 50 mL/min — AB (ref >60)
GLUCOSE: 106 mg/dL — AB (ref 65–99)
POTASSIUM: 3.8 mmol/L (ref 3.5–5.1)
Sodium: 140 mmol/L (ref 135–145)

## 2016-05-31 LAB — CBC
HEMATOCRIT: 42.8 % (ref 36.0–46.0)
Hemoglobin: 13.8 g/dL (ref 12.0–15.0)
MCH: 29.7 pg (ref 26.0–34.0)
MCHC: 32.2 g/dL (ref 30.0–36.0)
MCV: 92 fL (ref 78.0–100.0)
Platelets: 203 10*3/uL (ref 150–400)
RBC: 4.65 MIL/uL (ref 3.87–5.11)
RDW: 16.4 % — AB (ref 11.5–15.5)
WBC: 6.8 10*3/uL (ref 4.0–10.5)

## 2016-05-31 NOTE — Progress Notes (Signed)
PROGRESS NOTE  Jillian Cantrell [id number] DOB: 04-16-1927 DOA: 05/30/2016 PCP: Geoffery Lyons, MD  Brief History:  82 year old female with a history of atrial fibrillation not on anticoagulation secondary to GI bleed, stroke, UTIs, hypertension, hypothyroidism, hyperlipidemia presenting with 1 day history of severe left flank pain that developed on 05/30/2016. The patient complained of some dysuria and worsening pain with urination. There was no fevers or chills, nausea, vomiting. Because of her previous UTIs, the patient did emergency department for further evaluation. CT of the abdomen and pelvis revealed a small focus of gas in the bladder lumen with mild left-sided hydronephrosis without any renal calculi. The patient will start intravenous antibiotics with good clinical response.  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 given small focus of gas within the bladder without any recent instrumentation -Clinically improving with ceftriaxone -Continue ceftriaxone pending final culture data -The patient is afebrile and hemodynamically stable  Left flank pain/hydronephrosis -Certainly, the patient may have passed a recent stone -No stone on CT abdomen with renal protocol -Likely represents a spectrum of pyelonephritis -clinically improving on ceftriaxone  Atrial fibrillation -CHADSVASc = 6 -Not a candidate for anticoagulation secondary to GI bleed -Continue aspirin 81 mg daily -Continue diltiazem, bisoprolol  Hypertension -Continue diltiazem and bisoprolol  Hypothyroidism -Continue Synthroid  Hyperlipidemia Continue simvastatin   Disposition Plan:   Home 8/19 if stable Family Communication:   Son updated at bedside 8/18  Consultants:  none  Code Status:  FULL DVT Prophylaxis:  [place] Lovenox   Procedures: As Listed in Progress Note Above  Antibiotics: None    Subjective: Patient states that her left flank pain is improving.  Denies any fevers, chills, chest pain, shortness breath, nausea, vomiting, diarrhea, bowel pain. No dysuria. Denies any headache or visual disturbance. No neck pain.   Objective: Vitals:   05/30/16 1723 05/30/16 2051 05/31/16 0545 05/31/16 1402  BP: (!) 152/80 115/67 (!) 149/66 119/68  Pulse: 69 84 67 68  Resp: 18 17 17 17   Temp: 97.7 F (36.5 C) 97.9 F (36.6 C) 97.5 F (36.4 C) 97.6 F (36.4 C)  TempSrc: Oral Oral Oral Oral  SpO2: 98% 96% 94% 97%    Intake/Output Summary (Last 24 hours) at 05/31/16 1820 Last data filed at 05/31/16 1700  Gross per 24 hour  Intake             1730 ml  Output             2325 ml  Net             -595 ml   Weight change:  Exam:   General:  Pt is alert, follows commands appropriately, not in acute distress  HEENT: No icterus, No thrush, No neck mass, [place]/AT  Cardiovascular: RRR, S1/S2, no rubs, no gallops  Respiratory: CTA bilaterally, no wheezing, no crackles, no rhonchi  Abdomen: Soft/+BS, non tender, non distended, no guarding  Extremities: No edema, No lymphangitis, No petechiae, No rashes, no synovitis   Data Reviewed: I have personally reviewed following labs and imaging studies Basic Metabolic Panel:  Recent Labs Lab 05/30/16 1155 05/31/16 0504  NA 139 140  K 3.9 3.8  CL 103 102  CO2 27 28  GLUCOSE 102* 106*  BUN 10 11  CREATININE 0.98 0.99  CALCIUM 10.0 9.4   Liver Function Tests:  Recent Labs Lab 05/30/16 1155  AST 20  ALT 12*  ALKPHOS 109  BILITOT 0.7  PROT 6.9  ALBUMIN 3.9   No results for input(s): LIPASE, AMYLASE in the last 168 hours. No results for input(s): AMMONIA in the last 168 hours. Coagulation Profile: No results for input(s): INR, PROTIME in the last 168 hours. CBC:  Recent Labs Lab 05/30/16 1155 05/31/16 0504  WBC 7.7 6.8  HGB 15.3* 13.8  HCT 47.1* 42.8  MCV 92.9 92.0  PLT 223 203   Cardiac Enzymes: No results for input(s): CKTOTAL, CKMB, CKMBINDEX, TROPONINI in the last 168  hours. BNP: Invalid input(s): POCBNP CBG: No results for input(s): GLUCAP in the last 168 hours. HbA1C: No results for input(s): HGBA1C in the last 72 hours. Urine analysis:    Component Value Date/Time   COLORURINE YELLOW 05/30/2016 1244   APPEARANCEUR CLOUDY (A) 05/30/2016 1244   LABSPEC 1.015 05/30/2016 1244   PHURINE 5.5 05/30/2016 [place] 05/30/2016 1244   HGBUR MODERATE (A) 05/30/2016 1244   BILIRUBINUR NEGATIVE 05/30/2016 1244   KETONESUR NEGATIVE 05/30/2016 1244   PROTEINUR NEGATIVE 05/30/2016 1244   NITRITE POSITIVE (A) 05/30/2016 1244   LEUKOCYTESUR TRACE (A) 05/30/2016 1244   Sepsis Labs: @LABRCNTIP (procalcitonin:4,lacticidven:4) ) Recent Results (from the past 240 hour(s))  Urine culture     Status: Abnormal (Preliminary result)   Collection Time: 05/30/16 12:44 PM  Result Value Ref Range Status   Specimen Description URINE, CLEAN CATCH  Final   Special Requests NONE  Final   Culture >=100,000 COLONIES/mL ESCHERICHIA COLI (A)  Final   Report Status PENDING  Incomplete     Scheduled Meds: . aspirin EC  81 mg Oral Daily  . bisoprolol  2.5 mg Oral Daily  . cefTRIAXone (ROCEPHIN)  IV  1 g Intravenous Q24H  . diltiazem  60 mg Oral BID  . enoxaparin (LOVENOX) injection  40 mg Subcutaneous Q24H  . latanoprost  1 drop Both Eyes QHS  . levothyroxine  150 mcg Oral QAC breakfast  .  morphine injection  4 mg Intravenous Once  . simvastatin  20 mg Oral Daily   Continuous Infusions:   Procedures/Studies: Dg Chest 2 View  Result Date: 05/30/2016 CLINICAL DATA:  82 year old female with increase shortness of breath, left flank pain and weakness. Initial encounter. EXAM: CHEST  2 VIEW COMPARISON:  CT Abdomen and Pelvis 1432 hours today. Chest abdomen pelvis CT 01/07/2015 and earlier. FINDINGS: Chronic cardiomegaly. Calcified aortic atherosclerosis. Other mediastinal contours are within normal limits. Sequelae of left thyroidectomy. One of the patient arms  is not raised on the lateral view which limits its utility. No pneumothorax or pulmonary edema. No pleural effusion or confluent pulmonary opacity. No acute osseous abnormality identified. Advanced degenerative changes at the left shoulder again noted. IMPRESSION: 1.  No acute cardiopulmonary abnormality. 2. Cardiomegaly.  Calcified aortic atherosclerosis. Electronically Signed   By: Genevie Ann M.D.   On: 05/30/2016 15:38   Ct Renal Stone Study  Result Date: 05/30/2016 CLINICAL DATA:  Initial evaluation for acute left flank pain. EXAM: CT ABDOMEN AND PELVIS WITHOUT CONTRAST TECHNIQUE: Multidetector CT imaging of the abdomen and pelvis was performed following the standard protocol without IV contrast. COMPARISON:  Prior CT from 11/01/2015. FINDINGS: Scattered bibasilar atelectasis/scarring present within the visualized lung bases. Visualized lung bases are otherwise clear. The cardiomegaly partially visualized. Scattered coronary artery calcifications with aortic and mitral valvular calcifications. No pleural or pericardial fusion. Limited noncontrast evaluation of the liver is unremarkable. Gallbladder is absent. No biliary dilatation. Spleen within normal limits on this noncontrast examination. Known splenic lesions not well seen.  Adrenal glands and pancreas within normal limits. The mild left-sided hydronephrosis present. No radiopaque calculi seen within the left kidney. No radiopaque stones seen along the course of the left ureter. Finding raises the possibility for AE recently passed stone. Possible infection could also be considered. Extrarenal pelvis present on the right without frank hydronephrosis. No right-sided renal calculi. No radiopaque calculi seen along the course of the right ureter. There is no hydroureter. Stomach within normal limits. No evidence for bowel obstruction. No abnormal wall thickening or inflammatory fat stranding seen about the bowels. Colonic diverticulosis without evidence for  acute diverticulitis. Bladder well distended. No layering stones within the lateral lumen. Small focus of gas within the bladder lumen may be related to recent intervention. The Uterus is absent.  Ovaries not discretely identified. Fat and bowel containing right inguinal hernia noted without associated obstruction. No free air or fluid. No adenopathy. Moderate aorto bi-iliac atherosclerotic disease. No aneurysm. No acute osseous abnormality. No worrisome lytic or blastic osseous lesions. IMPRESSION: 1. Mild left-sided hydronephrosis with no obstructive radiopaque stone identified. Finding raises the possibility for a recently passed stone. Possible infection could also be considered. Correlation with urinalysis recommended. No other radiopaque calculi identified. 2. No other acute intra-abdominal or pelvic process. 3. Colonic diverticulosis without evidence for acute diverticulitis. 4. Small focus of gas within the bladder lumen. Question recent intervention/catheterization. 5. Fat and small bowel containing right inguinal hernia without associated obstruction or inflammation. Electronically Signed   By: Jeannine Boga M.D.   On: 05/30/2016 15:10    Leenah Seidner, DO  [facility] Pager [phone]  If 7PM-7AM, please contact night-coverage [email or web address] Password [email or web address] 05/31/2016, 6:20 PM   LOS: 1 day

## 2016-06-01 DIAGNOSIS — Z8673 Personal history of transient ischemic attack (TIA), and cerebral infarction without residual deficits: Secondary | ICD-10-CM

## 2016-06-01 LAB — URINE CULTURE: Culture: >=100000 — AB

## 2016-06-01 MED ORDER — AMOXICILLIN 500 MG PO CAPS: 500.0000 mg | ORAL_CAPSULE | Freq: Three times a day (TID) | ORAL | 0 refills | Status: DC

## 2016-06-01 MED ORDER — AMOXICILLIN 500 MG PO CAPS
500.0000 mg | ORAL_CAPSULE | Freq: Three times a day (TID) | ORAL | Status: DC
  Administered 2016-06-01: 500 mg via ORAL
  Filled 2016-06-01: qty 1

## 2016-06-01 NOTE — Progress Notes (Signed)
Pt for discharge today accompy by daughter.  DC instructions given and reviewed.

## 2016-06-01 NOTE — Discharge Summary (Signed)
Physician Discharge Summary  Jillian Cantrell [id number] DOB: 02/03/1927 DOA: 05/30/2016  PCP: Geoffery Lyons, MD  Admit date: 05/30/2016 Discharge date: 06/01/2016  Admitted From: Home Disposition:  Home  Recommendations for Outpatient Follow-up:  1. Follow up with PCP in 1-2 weeks 2. Please obtain BMP/CBC in one week  Home Health: NO Equipment/Devices: no  Discharge Condition: Stable CODE STATUS: FULL Diet recommendation: Heart Healthy   Brief/Interim Summary: 82 year old female with a history of atrial fibrillation not on anticoagulation secondary to GI bleed, stroke, UTIs, hypertension, hypothyroidism, hyperlipidemia presenting with 1 day history of severe left flank pain that developed on 05/30/2016. The patient complained of some dysuria and worsening pain with urination. There was no fevers or chills, nausea, vomiting. Because of her previous UTIs, the patient did emergency department for further evaluation. CT of the abdomen and pelvis revealed a small focus of gas in the bladder lumen with mild left-sided hydronephrosis without any renal calculi. The patient will start intravenous antibiotics with good clinical response.  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 given small focus of gas within the bladder without any recent instrumentation -Clinically improving with ceftriaxone -Initially started ceftriaxone pending final culture data -The patient is afebrile and hemodynamically stable -home with amoxicillin  Left flank pain/hydronephrosis -Certainly, the patient may have passed a recent stone -No stone on CT abdomen with renal protocol -Likely represents a spectrum of pyelonephritis -clinically improving on ceftriaxone--pain much improved -home with amoxicillin x 12 more days to finish 14 days of tx  Atrial fibrillation -CHADSVASc = 6 -Not a candidate for anticoagulation secondary to GI bleed -Continue aspirin 81 mg  daily -Continue diltiazem, bisoprolol -rate controlled  Hypertension -Continue diltiazem and bisoprolol  Hypothyroidism -Continue Synthroid  Hyperlipidemia Continue simvastatin   Discharge Instructions     Medication List    TAKE these medications   amoxicillin 500 MG capsule Commonly known as:  AMOXIL Take 1 capsule (500 mg total) by mouth every 8 (eight) hours.   aspirin 81 MG EC tablet Take 81 mg by mouth daily. Swallow whole.   bisoprolol-hydrochlorothiazide 2.5-6.25 MG tablet Commonly known as:  ZIAC Take 1 tablet by mouth daily.   diltiazem 120 MG tablet Commonly known as:  CARDIZEM Take 0.5 tablets (60 mg total) by mouth 2 (two) times daily.   latanoprost 0.005 % ophthalmic solution Commonly known as:  XALATAN Place 1 drop into both eyes at bedtime.   levothyroxine 150 MCG tablet Commonly known as:  SYNTHROID, LEVOTHROID Take 1 tablet (150 mcg total) by mouth daily before breakfast.   simvastatin 20 MG tablet Commonly known as:  ZOCOR Take 20 mg by mouth daily.       Allergies  Allergen Reactions  . Codeine Hives    Consultations:  None   Procedures/Studies: Dg Chest 2 View  Result Date: 05/30/2016 CLINICAL DATA:  82 year old female with increase shortness of breath, left flank pain and weakness. Initial encounter. EXAM: CHEST  2 VIEW COMPARISON:  CT Abdomen and Pelvis 1432 hours today. Chest abdomen pelvis CT 01/07/2015 and earlier. FINDINGS: Chronic cardiomegaly. Calcified aortic atherosclerosis. Other mediastinal contours are within normal limits. Sequelae of left thyroidectomy. One of the patient arms is not raised on the lateral view which limits its utility. No pneumothorax or pulmonary edema. No pleural effusion or confluent pulmonary opacity. No acute osseous abnormality identified. Advanced degenerative changes at the left shoulder again noted. IMPRESSION: 1.  No acute cardiopulmonary abnormality. 2. Cardiomegaly.  Calcified aortic  atherosclerosis. Electronically Signed  By: Genevie Ann M.D.   On: 05/30/2016 15:38   Ct Renal Stone Study  Result Date: 05/30/2016 CLINICAL DATA:  Initial evaluation for acute left flank pain. EXAM: CT ABDOMEN AND PELVIS WITHOUT CONTRAST TECHNIQUE: Multidetector CT imaging of the abdomen and pelvis was performed following the standard protocol without IV contrast. COMPARISON:  Prior CT from 11/01/2015. FINDINGS: Scattered bibasilar atelectasis/scarring present within the visualized lung bases. Visualized lung bases are otherwise clear. The cardiomegaly partially visualized. Scattered coronary artery calcifications with aortic and mitral valvular calcifications. No pleural or pericardial fusion. Limited noncontrast evaluation of the liver is unremarkable. Gallbladder is absent. No biliary dilatation. Spleen within normal limits on this noncontrast examination. Known splenic lesions not well seen. Adrenal glands and pancreas within normal limits. The mild left-sided hydronephrosis present. No radiopaque calculi seen within the left kidney. No radiopaque stones seen along the course of the left ureter. Finding raises the possibility for AE recently passed stone. Possible infection could also be considered. Extrarenal pelvis present on the right without frank hydronephrosis. No right-sided renal calculi. No radiopaque calculi seen along the course of the right ureter. There is no hydroureter. Stomach within normal limits. No evidence for bowel obstruction. No abnormal wall thickening or inflammatory fat stranding seen about the bowels. Colonic diverticulosis without evidence for acute diverticulitis. Bladder well distended. No layering stones within the lateral lumen. Small focus of gas within the bladder lumen may be related to recent intervention. The Uterus is absent.  Ovaries not discretely identified. Fat and bowel containing right inguinal hernia noted without associated obstruction. No free air or fluid. No  adenopathy. Moderate aorto bi-iliac atherosclerotic disease. No aneurysm. No acute osseous abnormality. No worrisome lytic or blastic osseous lesions. IMPRESSION: 1. Mild left-sided hydronephrosis with no obstructive radiopaque stone identified. Finding raises the possibility for a recently passed stone. Possible infection could also be considered. Correlation with urinalysis recommended. No other radiopaque calculi identified. 2. No other acute intra-abdominal or pelvic process. 3. Colonic diverticulosis without evidence for acute diverticulitis. 4. Small focus of gas within the bladder lumen. Question recent intervention/catheterization. 5. Fat and small bowel containing right inguinal hernia without associated obstruction or inflammation. Electronically Signed   By: Jeannine Boga M.D.   On: 05/30/2016 15:10         Discharge Exam: Vitals:   05/31/16 2142 06/01/16 0549  BP: (!) 134/58 136/66  Pulse: 63 80  Resp: 17 17  Temp: 98.1 F (36.7 C) 98.2 F (36.8 C)   Vitals:   05/31/16 0545 05/31/16 1402 05/31/16 2142 06/01/16 0549  BP: (!) 149/66 119/68 (!) 134/58 136/66  Pulse: 67 68 63 80  Resp: 17 17 17 17   Temp: 97.5 F (36.4 C) 97.6 F (36.4 C) 98.1 F (36.7 C) 98.2 F (36.8 C)  TempSrc: Oral Oral Oral Oral  SpO2: 94% 97% 95% 97%    General: Pt is alert, awake, not in acute distress Cardiovascular: RRR, S1/S2 +, no rubs, no gallops Respiratory: CTA bilaterally, no wheezing, no rhonchi Abdominal: Soft, NT, ND, bowel sounds + Extremities: no edema, no cyanosis   The results of significant diagnostics from this hospitalization (including imaging, microbiology, ancillary and laboratory) are listed below for reference.    Significant Diagnostic Studies: Dg Chest 2 View  Result Date: 05/30/2016 CLINICAL DATA:  82 year old female with increase shortness of breath, left flank pain and weakness. Initial encounter. EXAM: CHEST  2 VIEW COMPARISON:  CT Abdomen and Pelvis 1432  hours today. Chest abdomen pelvis CT  01/07/2015 and earlier. FINDINGS: Chronic cardiomegaly. Calcified aortic atherosclerosis. Other mediastinal contours are within normal limits. Sequelae of left thyroidectomy. One of the patient arms is not raised on the lateral view which limits its utility. No pneumothorax or pulmonary edema. No pleural effusion or confluent pulmonary opacity. No acute osseous abnormality identified. Advanced degenerative changes at the left shoulder again noted. IMPRESSION: 1.  No acute cardiopulmonary abnormality. 2. Cardiomegaly.  Calcified aortic atherosclerosis. Electronically Signed   By: Genevie Ann M.D.   On: 05/30/2016 15:38   Ct Renal Stone Study  Result Date: 05/30/2016 CLINICAL DATA:  Initial evaluation for acute left flank pain. EXAM: CT ABDOMEN AND PELVIS WITHOUT CONTRAST TECHNIQUE: Multidetector CT imaging of the abdomen and pelvis was performed following the standard protocol without IV contrast. COMPARISON:  Prior CT from 11/01/2015. FINDINGS: Scattered bibasilar atelectasis/scarring present within the visualized lung bases. Visualized lung bases are otherwise clear. The cardiomegaly partially visualized. Scattered coronary artery calcifications with aortic and mitral valvular calcifications. No pleural or pericardial fusion. Limited noncontrast evaluation of the liver is unremarkable. Gallbladder is absent. No biliary dilatation. Spleen within normal limits on this noncontrast examination. Known splenic lesions not well seen. Adrenal glands and pancreas within normal limits. The mild left-sided hydronephrosis present. No radiopaque calculi seen within the left kidney. No radiopaque stones seen along the course of the left ureter. Finding raises the possibility for AE recently passed stone. Possible infection could also be considered. Extrarenal pelvis present on the right without frank hydronephrosis. No right-sided renal calculi. No radiopaque calculi seen along the course of  the right ureter. There is no hydroureter. Stomach within normal limits. No evidence for bowel obstruction. No abnormal wall thickening or inflammatory fat stranding seen about the bowels. Colonic diverticulosis without evidence for acute diverticulitis. Bladder well distended. No layering stones within the lateral lumen. Small focus of gas within the bladder lumen may be related to recent intervention. The Uterus is absent.  Ovaries not discretely identified. Fat and bowel containing right inguinal hernia noted without associated obstruction. No free air or fluid. No adenopathy. Moderate aorto bi-iliac atherosclerotic disease. No aneurysm. No acute osseous abnormality. No worrisome lytic or blastic osseous lesions. IMPRESSION: 1. Mild left-sided hydronephrosis with no obstructive radiopaque stone identified. Finding raises the possibility for a recently passed stone. Possible infection could also be considered. Correlation with urinalysis recommended. No other radiopaque calculi identified. 2. No other acute intra-abdominal or pelvic process. 3. Colonic diverticulosis without evidence for acute diverticulitis. 4. Small focus of gas within the bladder lumen. Question recent intervention/catheterization. 5. Fat and small bowel containing right inguinal hernia without associated obstruction or inflammation. Electronically Signed   By: Jeannine Boga M.D.   On: 05/30/2016 15:10     Microbiology: Recent Results (from the past 240 hour(s))  Urine culture     Status: Abnormal   Collection Time: 05/30/16 12:44 PM  Result Value Ref Range Status   Specimen Description URINE, CLEAN CATCH  Final   Special Requests NONE  Final   Culture >=100,000 COLONIES/mL ESCHERICHIA COLI (A)  Final   Report Status 06/01/2016 FINAL  Final   Organism ID, Bacteria ESCHERICHIA COLI (A)  Final      Susceptibility   Escherichia coli - MIC*    AMPICILLIN <=2 SENSITIVE Sensitive     CEFAZOLIN <=4 SENSITIVE Sensitive      CEFTRIAXONE <=1 SENSITIVE Sensitive     CIPROFLOXACIN <=0.25 SENSITIVE Sensitive     GENTAMICIN <=1 SENSITIVE Sensitive  IMIPENEM <=0.25 SENSITIVE Sensitive     NITROFURANTOIN <=16 SENSITIVE Sensitive     TRIMETH/SULFA <=20 SENSITIVE Sensitive     AMPICILLIN/SULBACTAM <=2 SENSITIVE Sensitive     PIP/TAZO <=4 SENSITIVE Sensitive     Extended ESBL NEGATIVE Sensitive     * >=100,000 COLONIES/mL ESCHERICHIA COLI     Labs: Basic Metabolic Panel:  Recent Labs Lab 05/30/16 1155 05/31/16 0504  NA 139 140  K 3.9 3.8  CL 103 102  CO2 27 28  GLUCOSE 102* 106*  BUN 10 11  CREATININE 0.98 0.99  CALCIUM 10.0 9.4   Liver Function Tests:  Recent Labs Lab 05/30/16 1155  AST 20  ALT 12*  ALKPHOS 109  BILITOT 0.7  PROT 6.9  ALBUMIN 3.9   No results for input(s): LIPASE, AMYLASE in the last 168 hours. No results for input(s): AMMONIA in the last 168 hours. CBC:  Recent Labs Lab 05/30/16 1155 05/31/16 0504  WBC 7.7 6.8  HGB 15.3* 13.8  HCT 47.1* 42.8  MCV 92.9 92.0  PLT 223 203   Cardiac Enzymes: No results for input(s): CKTOTAL, CKMB, CKMBINDEX, TROPONINI in the last 168 hours. BNP: Invalid input(s): POCBNP CBG: No results for input(s): GLUCAP in the last 168 hours.  Time coordinating discharge:  Greater than 30 minutes  Signed:  Helga Asbury, DO [facility] Pager: [phone] 06/01/2016, 1:24 PM

## 2016-06-12 DIAGNOSIS — N390 Urinary tract infection, site not specified: Secondary | ICD-10-CM | POA: Diagnosis not present

## 2016-06-30 DIAGNOSIS — N390 Urinary tract infection, site not specified: Secondary | ICD-10-CM | POA: Diagnosis not present

## 2016-06-30 DIAGNOSIS — I10 Essential (primary) hypertension: Secondary | ICD-10-CM | POA: Diagnosis not present

## 2016-06-30 DIAGNOSIS — Z6827 Body mass index (BMI) 27.0-27.9, adult: Secondary | ICD-10-CM | POA: Diagnosis not present

## 2016-06-30 DIAGNOSIS — R600 Localized edema: Secondary | ICD-10-CM | POA: Diagnosis not present

## 2016-06-30 DIAGNOSIS — I480 Paroxysmal atrial fibrillation: Secondary | ICD-10-CM | POA: Diagnosis not present

## 2016-11-13 DIAGNOSIS — R10817 Generalized abdominal tenderness: Secondary | ICD-10-CM | POA: Diagnosis not present

## 2016-11-13 DIAGNOSIS — Z6828 Body mass index (BMI) 28.0-28.9, adult: Secondary | ICD-10-CM | POA: Diagnosis not present

## 2016-11-13 DIAGNOSIS — R350 Frequency of micturition: Secondary | ICD-10-CM | POA: Diagnosis not present

## 2016-11-13 DIAGNOSIS — I10 Essential (primary) hypertension: Secondary | ICD-10-CM | POA: Diagnosis not present

## 2017-01-20 ENCOUNTER — Encounter: Payer: Self-pay | Admitting: Podiatry

## 2017-01-20 ENCOUNTER — Ambulatory Visit (INDEPENDENT_AMBULATORY_CARE_PROVIDER_SITE_OTHER): Payer: Medicare Other

## 2017-01-20 ENCOUNTER — Ambulatory Visit (INDEPENDENT_AMBULATORY_CARE_PROVIDER_SITE_OTHER): Payer: Medicare Other | Admitting: Podiatry

## 2017-01-20 DIAGNOSIS — L84 Corns and callosities: Secondary | ICD-10-CM | POA: Diagnosis not present

## 2017-01-20 DIAGNOSIS — R0989 Other specified symptoms and signs involving the circulatory and respiratory systems: Secondary | ICD-10-CM | POA: Diagnosis not present

## 2017-01-20 DIAGNOSIS — M779 Enthesopathy, unspecified: Secondary | ICD-10-CM

## 2017-01-20 DIAGNOSIS — Q667 Congenital pes cavus, unspecified foot: Secondary | ICD-10-CM

## 2017-01-20 DIAGNOSIS — M2041 Other hammer toe(s) (acquired), right foot: Secondary | ICD-10-CM | POA: Diagnosis not present

## 2017-01-20 DIAGNOSIS — R609 Edema, unspecified: Secondary | ICD-10-CM

## 2017-01-20 DIAGNOSIS — M2042 Other hammer toe(s) (acquired), left foot: Secondary | ICD-10-CM

## 2017-01-20 NOTE — Progress Notes (Signed)
   Subjective:    Patient ID: LANIKA COLGATE, female    DOB: 08-Jun-1927, 83 y.o.   MRN: [id number]  HPI   83 year old female presents the office today for concerns of bilateral swelling to her feet and legs. She has states tha the right side is worse on left and this is been ongoing since the stroke but is been getting worse. She has pain in the right third toe with pressure and weightbearing. She also gets pain to the left foot which is been ongoing for about 1 year. The right foot pain has been ongoing for 47 ys. She denies any recent injury. She has no other complaints today. She presents today wier daughter.  Review of Systems  Eyes: Positive for visual disturbance.  Respiratory: Positive for shortness of breath.   Cardiovascular: Positive for chest pain and leg swelling.  Gastrointestinal: Positive for constipation.  Musculoskeletal: Positive for back pain and gait problem.       Joint pain  Neurological: Positive for weakness.  All other systems reviewed and are negative.      Objective:   Physical Exam General: Awake, NAD, poor historian and unable to really give a history of her symptoms or able to tell me where her pain is other than pointing to the outside of the left foot where she gets the majority of her pain.   Dermatological: Hyperkeratotic lesion sharply debrided to the right third toe. Upon debridement there is no underlying ulceration, drainage or any signs of infection. Also hyperkeratotic lesion left foot second metatarsal 5. No underlying ulceration, drainage or signs of infection. There is no other open lesions or pre-ulcerative lesions identified today.  Vascular: Dorsalis Pedis artery and Posterior Tibial artery pedal pulses are 1/4 bilateral with immedate capillary fill time.  There is no pain with calf compression, erythema, warmth. There is bilateral leg swelling with a right side worse then left. This is chronic and ongoing for several years but subjectively  has been worsening.  Neruologic: Sensation appears to be intact with light touch.  Musculoskeletal:tenderness to hyperkeratotic lesions bilaterally.There is also tenderness to palpation the lateral aspect of the foot on the course of the peroneal tendon just posterior and inferior to lateral malleolus on the insertion of the fifth metatarsal base. There is a cavus foot type present. I believe the pain she is having is more from biomechanical changes. Hammertoes are present with some of the right third toe resulted in the hyperkeratotic lesion.  Gait: Unassisted, Nonantalgic.     Assessment & Plan:  83 year old female with symptomatic hyperkeratotic lesions, left foot pain likely result of by mechanical changes/cavus foot type; swelling -Treatment options discussed including all alternatives, risks, and complications -Etiology of symptoms were discussed -X-rays obtained and reviewed. No evidence of acute fracture identified. Arthritic changes are present. Cavus foot type present. -Ankle brace was dispensed the left side help stabilize her foot. -Hyperkeratotic lesions were also debrided 2 without complications or bleeding. -Offloading pads were dispensed. I also added a lateral wedge to her shoe to see if this will help. If she fells off balance or any issues to remove it.  -Will order arterial as well as venous studies given the swelling to her legs.  Celesta Gentile, DPM

## 2017-01-22 ENCOUNTER — Telehealth: Payer: Self-pay | Admitting: *Deleted

## 2017-01-22 DIAGNOSIS — R609 Edema, unspecified: Secondary | ICD-10-CM

## 2017-01-22 DIAGNOSIS — R0989 Other specified symptoms and signs involving the circulatory and respiratory systems: Secondary | ICD-10-CM

## 2017-01-22 NOTE — Telephone Encounter (Addendum)
-----   Message from Trula Slade, DPM sent at 01/22/2017 12:11 PM EDT ----- Can you please order arterial studies for decreased pulses and venous duplex for swelling? Thanks. 05/03/2018Barnett Applebaum states preliminary venous doppler result is negative for DVT. I informed Dr. Jacqualyn Posey and he stated allow pt to leave [place] not orders at this time. Gina informed pt of results. I informed Barnett Applebaum - [facility]. 02/18/2017-I spoke with pt's dtr-in-law and she states she had explained the results to pt, I told her I needed to inform pt per Dr. Jacqualyn Posey. I informed pt of Dr. Leigh Aurora review of results.

## 2017-02-06 ENCOUNTER — Ambulatory Visit (INDEPENDENT_AMBULATORY_CARE_PROVIDER_SITE_OTHER): Payer: Medicare Other | Admitting: Ophthalmology

## 2017-02-06 ENCOUNTER — Inpatient Hospital Stay (HOSPITAL_COMMUNITY): Admission: RE | Admit: 2017-02-06 | Payer: Medicare Other | Source: Ambulatory Visit

## 2017-02-06 DIAGNOSIS — H35373 Puckering of macula, bilateral: Secondary | ICD-10-CM

## 2017-02-06 DIAGNOSIS — H43813 Vitreous degeneration, bilateral: Secondary | ICD-10-CM | POA: Diagnosis not present

## 2017-02-06 DIAGNOSIS — H35033 Hypertensive retinopathy, bilateral: Secondary | ICD-10-CM

## 2017-02-06 DIAGNOSIS — I10 Essential (primary) hypertension: Secondary | ICD-10-CM | POA: Diagnosis not present

## 2017-02-13 ENCOUNTER — Ambulatory Visit (HOSPITAL_COMMUNITY)
Admission: RE | Admit: 2017-02-13 | Discharge: 2017-02-13 | Disposition: A | Discharge status: Home / Self Care | Payer: Medicare Other | Source: Ambulatory Visit | Attending: Cardiology | Admitting: Cardiology

## 2017-02-13 DIAGNOSIS — Z8673 Personal history of transient ischemic attack (TIA), and cerebral infarction without residual deficits: Secondary | ICD-10-CM | POA: Diagnosis not present

## 2017-02-13 DIAGNOSIS — I82532 Chronic embolism and thrombosis of left popliteal vein: Secondary | ICD-10-CM | POA: Diagnosis not present

## 2017-02-13 DIAGNOSIS — Z87891 Personal history of nicotine dependence: Secondary | ICD-10-CM | POA: Insufficient documentation

## 2017-02-13 DIAGNOSIS — I10 Essential (primary) hypertension: Secondary | ICD-10-CM | POA: Diagnosis not present

## 2017-02-13 DIAGNOSIS — R600 Localized edema: Secondary | ICD-10-CM | POA: Insufficient documentation

## 2017-02-13 DIAGNOSIS — R609 Edema, unspecified: Secondary | ICD-10-CM | POA: Diagnosis not present

## 2017-02-14 ENCOUNTER — Ambulatory Visit (HOSPITAL_COMMUNITY)
Admission: RE | Admit: 2017-02-14 | Discharge: 2017-02-14 | Disposition: A | Discharge status: Home / Self Care | Payer: Medicare Other | Source: Ambulatory Visit | Attending: Cardiovascular Disease | Admitting: Cardiovascular Disease

## 2017-02-14 ENCOUNTER — Other Ambulatory Visit: Payer: Self-pay | Admitting: Podiatry

## 2017-02-14 DIAGNOSIS — M7989 Other specified soft tissue disorders: Secondary | ICD-10-CM | POA: Insufficient documentation

## 2017-02-14 DIAGNOSIS — R0989 Other specified symptoms and signs involving the circulatory and respiratory systems: Secondary | ICD-10-CM

## 2017-02-18 ENCOUNTER — Telehealth: Payer: Self-pay | Admitting: *Deleted

## 2017-02-18 DIAGNOSIS — R609 Edema, unspecified: Secondary | ICD-10-CM

## 2017-02-18 NOTE — Telephone Encounter (Signed)
-----   Message from Trula Slade, DPM sent at 02/17/2017  5:44 PM EDT ----- Normal- please let her know.

## 2017-02-18 NOTE — Telephone Encounter (Addendum)
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 Thanks. 02/18/2017-Referral faxed to Dr. Fletcher Anon - [place].

## 2017-02-21 DIAGNOSIS — Q150 Congenital glaucoma: Secondary | ICD-10-CM | POA: Diagnosis not present

## 2017-02-21 DIAGNOSIS — H401320 Pigmentary glaucoma, left eye, stage unspecified: Secondary | ICD-10-CM | POA: Diagnosis not present

## 2017-02-21 DIAGNOSIS — H401310 Pigmentary glaucoma, right eye, stage unspecified: Secondary | ICD-10-CM | POA: Diagnosis not present

## 2017-02-24 ENCOUNTER — Emergency Department (HOSPITAL_COMMUNITY): Payer: Medicare Other

## 2017-02-24 ENCOUNTER — Observation Stay (HOSPITAL_COMMUNITY)
Admission: EM | Admit: 2017-02-24 | Discharge: 2017-02-25 | Disposition: A | Discharge status: Skilled Nursing Facility | Payer: Medicare Other | Attending: Family Medicine | Admitting: Family Medicine

## 2017-02-24 ENCOUNTER — Observation Stay (HOSPITAL_COMMUNITY): Payer: Medicare Other

## 2017-02-24 ENCOUNTER — Encounter (HOSPITAL_COMMUNITY): Payer: Self-pay | Admitting: Emergency Medicine

## 2017-02-24 DIAGNOSIS — I69351 Hemiplegia and hemiparesis following cerebral infarction affecting right dominant side: Secondary | ICD-10-CM | POA: Diagnosis not present

## 2017-02-24 DIAGNOSIS — Z806 Family history of leukemia: Secondary | ICD-10-CM | POA: Diagnosis not present

## 2017-02-24 DIAGNOSIS — R531 Weakness: Secondary | ICD-10-CM | POA: Diagnosis not present

## 2017-02-24 DIAGNOSIS — M546 Pain in thoracic spine: Secondary | ICD-10-CM

## 2017-02-24 DIAGNOSIS — Z8619 Personal history of other infectious and parasitic diseases: Secondary | ICD-10-CM | POA: Diagnosis not present

## 2017-02-24 DIAGNOSIS — Z9049 Acquired absence of other specified parts of digestive tract: Secondary | ICD-10-CM | POA: Diagnosis not present

## 2017-02-24 DIAGNOSIS — Z885 Allergy status to narcotic agent status: Secondary | ICD-10-CM | POA: Insufficient documentation

## 2017-02-24 DIAGNOSIS — Z86011 Personal history of benign neoplasm of the brain: Secondary | ICD-10-CM | POA: Insufficient documentation

## 2017-02-24 DIAGNOSIS — I700 Atherosclerosis of aorta: Secondary | ICD-10-CM | POA: Insufficient documentation

## 2017-02-24 DIAGNOSIS — Z9889 Other specified postprocedural states: Secondary | ICD-10-CM | POA: Diagnosis not present

## 2017-02-24 DIAGNOSIS — Z85850 Personal history of malignant neoplasm of thyroid: Secondary | ICD-10-CM | POA: Diagnosis not present

## 2017-02-24 DIAGNOSIS — I4891 Unspecified atrial fibrillation: Secondary | ICD-10-CM | POA: Insufficient documentation

## 2017-02-24 DIAGNOSIS — S239XXA Sprain of unspecified parts of thorax, initial encounter: Secondary | ICD-10-CM

## 2017-02-24 DIAGNOSIS — E785 Hyperlipidemia, unspecified: Secondary | ICD-10-CM | POA: Insufficient documentation

## 2017-02-24 DIAGNOSIS — Z87891 Personal history of nicotine dependence: Secondary | ICD-10-CM | POA: Insufficient documentation

## 2017-02-24 DIAGNOSIS — Z79899 Other long term (current) drug therapy: Secondary | ICD-10-CM | POA: Insufficient documentation

## 2017-02-24 DIAGNOSIS — G8929 Other chronic pain: Secondary | ICD-10-CM | POA: Insufficient documentation

## 2017-02-24 DIAGNOSIS — Z66 Do not resuscitate: Secondary | ICD-10-CM | POA: Insufficient documentation

## 2017-02-24 DIAGNOSIS — I10 Essential (primary) hypertension: Secondary | ICD-10-CM | POA: Insufficient documentation

## 2017-02-24 DIAGNOSIS — E890 Postprocedural hypothyroidism: Secondary | ICD-10-CM | POA: Insufficient documentation

## 2017-02-24 DIAGNOSIS — M19012 Primary osteoarthritis, left shoulder: Secondary | ICD-10-CM | POA: Diagnosis not present

## 2017-02-24 DIAGNOSIS — Z90710 Acquired absence of both cervix and uterus: Secondary | ICD-10-CM | POA: Diagnosis not present

## 2017-02-24 DIAGNOSIS — N390 Urinary tract infection, site not specified: Secondary | ICD-10-CM

## 2017-02-24 DIAGNOSIS — Z7982 Long term (current) use of aspirin: Secondary | ICD-10-CM | POA: Diagnosis not present

## 2017-02-24 DIAGNOSIS — Z882 Allergy status to sulfonamides status: Secondary | ICD-10-CM | POA: Insufficient documentation

## 2017-02-24 DIAGNOSIS — Z87442 Personal history of urinary calculi: Secondary | ICD-10-CM | POA: Insufficient documentation

## 2017-02-24 DIAGNOSIS — M5134 Other intervertebral disc degeneration, thoracic region: Secondary | ICD-10-CM | POA: Diagnosis not present

## 2017-02-24 DIAGNOSIS — M25512 Pain in left shoulder: Secondary | ICD-10-CM | POA: Diagnosis not present

## 2017-02-24 DIAGNOSIS — R52 Pain, unspecified: Secondary | ICD-10-CM | POA: Diagnosis not present

## 2017-02-24 LAB — URINALYSIS, ROUTINE W REFLEX MICROSCOPIC
BILIRUBIN URINE: NEGATIVE
Glucose, UA: NEGATIVE mg/dL
Ketones, ur: NEGATIVE mg/dL
Nitrite: POSITIVE — AB
PH: 6 (ref 5.0–8.0)
Protein, ur: NEGATIVE mg/dL
SPECIFIC GRAVITY, URINE: 1.012 (ref 1.005–1.030)

## 2017-02-24 LAB — CBC WITH DIFFERENTIAL/PLATELET
BASOS ABS: 0 10*3/uL (ref 0.0–0.1)
Basophils Relative: 0 %
Eosinophils Absolute: 0 10*3/uL (ref 0.0–0.7)
Eosinophils Relative: 0 %
HEMATOCRIT: 45.9 % (ref 36.0–46.0)
Hemoglobin: 15.5 g/dL — ABNORMAL HIGH (ref 12.0–15.0)
LYMPHS ABS: 1.4 10*3/uL (ref 0.7–4.0)
LYMPHS PCT: 14 %
MCH: 32.2 pg (ref 26.0–34.0)
MCHC: 33.8 g/dL (ref 30.0–36.0)
MCV: 95.4 fL (ref 78.0–100.0)
MONO ABS: 1.6 10*3/uL — AB (ref 0.1–1.0)
Monocytes Relative: 16 %
NEUTROS ABS: 7 10*3/uL (ref 1.7–7.7)
Neutrophils Relative %: 70 %
Platelets: 215 10*3/uL (ref 150–400)
RBC: 4.81 MIL/uL (ref 3.87–5.11)
RDW: 13.5 % (ref 11.5–15.5)
WBC: 10 10*3/uL (ref 4.0–10.5)

## 2017-02-24 LAB — COMPREHENSIVE METABOLIC PANEL
ALBUMIN: 3.9 g/dL (ref 3.5–5.0)
ALT: 13 U/L — ABNORMAL LOW (ref 14–54)
ANION GAP: 10 (ref 5–15)
AST: 22 U/L (ref 15–41)
Alkaline Phosphatase: 100 U/L (ref 38–126)
BILIRUBIN TOTAL: 1.3 mg/dL — AB (ref 0.3–1.2)
BUN: 19 mg/dL (ref 6–20)
CO2: 26 mmol/L (ref 22–32)
Calcium: 9.4 mg/dL (ref 8.9–10.3)
Chloride: 102 mmol/L (ref 101–111)
Creatinine, Ser: 0.97 mg/dL (ref 0.44–1.00)
GFR calc Af Amer: 59 mL/min — ABNORMAL LOW (ref >60)
GFR calc non Af Amer: 51 mL/min — ABNORMAL LOW (ref >60)
GLUCOSE: 124 mg/dL — AB (ref 65–99)
POTASSIUM: 3.5 mmol/L (ref 3.5–5.1)
SODIUM: 138 mmol/L (ref 135–145)
TOTAL PROTEIN: 7.4 g/dL (ref 6.5–8.1)

## 2017-02-24 LAB — I-STAT TROPONIN, ED
Troponin i, poc: 0 ng/mL (ref 0.00–0.08)
Troponin i, poc: 0 ng/mL (ref 0.00–0.08)

## 2017-02-24 LAB — I-STAT CG4 LACTIC ACID, ED: LACTIC ACID, VENOUS: 1.25 mmol/L (ref 0.5–1.9)

## 2017-02-24 MED ORDER — ACETAMINOPHEN 325 MG PO TABS
650.0000 mg | ORAL_TABLET | Freq: Four times a day (QID) | ORAL | Status: DC
  Administered 2017-02-24 – 2017-02-25 (×4): 650 mg via ORAL
  Filled 2017-02-24 (×4): qty 2

## 2017-02-24 MED ORDER — ASPIRIN EC 81 MG PO TBEC
81.0000 mg | DELAYED_RELEASE_TABLET | Freq: Every day | ORAL | Status: DC
  Administered 2017-02-25: 81 mg via ORAL
  Filled 2017-02-24: qty 1

## 2017-02-24 MED ORDER — ASPIRIN 81 MG PO CHEW
324.0000 mg | CHEWABLE_TABLET | Freq: Once | ORAL | Status: AC
  Administered 2017-02-24: 324 mg via ORAL
  Filled 2017-02-24: qty 4

## 2017-02-24 MED ORDER — SIMVASTATIN 20 MG PO TABS: 20.0000 mg | ORAL_TABLET | Freq: Every day | ORAL | Status: DC

## 2017-02-24 MED ORDER — DILTIAZEM HCL 60 MG PO TABS
60.0000 mg | ORAL_TABLET | Freq: Two times a day (BID) | ORAL | Status: DC
  Administered 2017-02-24 – 2017-02-25 (×2): 60 mg via ORAL
  Filled 2017-02-24 (×2): qty 1

## 2017-02-24 MED ORDER — CYCLOBENZAPRINE HCL 5 MG PO TABS
5.0000 mg | ORAL_TABLET | Freq: Every day | ORAL | Status: DC
  Administered 2017-02-24 (×2): 5 mg via ORAL
  Filled 2017-02-24 (×2): qty 1

## 2017-02-24 MED ORDER — BISOPROLOL-HYDROCHLOROTHIAZIDE 2.5-6.25 MG PO TABS
1.0000 | ORAL_TABLET | Freq: Every day | ORAL | Status: DC
  Administered 2017-02-25: 1 via ORAL
  Filled 2017-02-24: qty 1

## 2017-02-24 MED ORDER — DEXTROSE 5 % IV SOLN
1.0000 g | Freq: Once | INTRAVENOUS | Status: DC
  Filled 2017-02-24: qty 10

## 2017-02-24 MED ORDER — ENOXAPARIN SODIUM 40 MG/0.4ML ~~LOC~~ SOLN
40.0000 mg | SUBCUTANEOUS | Status: DC
  Administered 2017-02-24: 40 mg via SUBCUTANEOUS
  Filled 2017-02-24: qty 0.4

## 2017-02-24 MED ORDER — LEVOTHYROXINE SODIUM 75 MCG PO TABS
150.0000 ug | ORAL_TABLET | Freq: Every day | ORAL | Status: DC
  Administered 2017-02-25: 150 ug via ORAL
  Filled 2017-02-24: qty 2

## 2017-02-24 NOTE — ED Triage Notes (Signed)
Pt complaint of acute chronic left shoulder pain; took (2) 650 mg tylenol prior to arrival; denies fall.

## 2017-02-24 NOTE — H&P (Signed)
Jillian Cantrell:[id number] DOB: 04/15/1927 DOA: 02/24/2017  Referring physician: Catalina Antigua PCP: Burnard Bunting, MD  Specialists: none  Chief Complaint:   HPI: Jillian Cantrell is a 83 y.o. 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 complicated I if before meals at that time Prior meningioma of the brain Hypertension Hypothyroidism Hyperlipidemia Last admission 05/2016 Escherichia coli cystitis  Presents to emergency room 2-3 day history of increasing mild confusion Family states they took her to ophthalmology as well as other physician visits on Thursday and Friday of the week prior and she started getting weaker She has had no fever, no chills, no nausea, no vomiting, no diarrhea, no melena, no hematemesis, no rash, no exposure to ill persons around her  Despite her weakness she was able to enjoy Mother's Day lunch with her family at her son's house who lives 3 miles away She is able to recall what she had for lunch, and is high functioning despite her large stroke in the past  Her husband passed away August 13, 2016 and she has intermittent caregivers seeing her  Daughter-in-law gives ancillary history states that main reason for bringing her over to the hospital was severe shoulder pain and weakness and she was not able to sleep overnight which may account for some of her confused state this morning when they found her  She usually ambulates with her left hand with the help of a walker as she has a dense CVA on the right side and has recovered her aphasic but not use of the right side of her body completely. Daughter-in-law feels that she overuses the left side of her body  In the emergency room workup revealed positive nitrites and moderate leukocytes without any white  count or symptoms of dysuria lactic acid was 1.2. BUN/creatinine 19/0.9 which is about her normal baseline point care troponins normal Hemoglobin 15 WBC 10 CXR = enlarged silhouette, advanced left glenohumeral arthritis Dedicated shoulder study showed severe degenerative changes to glenohumeral articulation   Review of Systems: See above discussion  Past Medical History:  Diagnosis Date  . Atrial fibrillation with normal ventricular rate ([place]) 10/2015   Rate control, no anticoagulation secondary to GI bleed  . Cancer [facility])    thyroid cancer s/p thyroidectomy  . Hypertension   . Iatrogenic hypothyroidism   . Stroke [facility]) 2003   Chronic right-sided weakness  . Thyroid disease    Past Surgical History:  Procedure Laterality Date  . ABDOMINAL HYSTERECTOMY    . APPENDECTOMY    . CHOLECYSTECTOMY    . THYROIDECTOMY    . VARICOSE VEIN SURGERY     Social History:  reports that she quit smoking about 48 years ago. She has never used smokeless tobacco. She reports that she does not drink alcohol or use drugs. Used to be a tobacco farmer-used also auction Raised her children and lives in [place]  . Sulfa Antibiotics   . Codeine Hives    Family History  Problem Relation Age of Onset  . Cancer Father        leukemia    Prior to Admission medications   Medication Sig Start Date End Date Taking? Authorizing Provider  aspirin 81 MG EC tablet Take 81 mg by mouth daily. Swallow whole.   Yes  [provider]  bisoprolol-hydrochlorothiazide (ZIAC) 2.5-6.25 MG per tablet Take 1 tablet by mouth daily.   Yes [provider]  diltiazem (CARDIZEM) 120 MG tablet Take 0.5 tablets (60 mg total) by mouth 2 (two) times daily. 11/07/15  Yes Verlee Monte, MD  latanoprost (XALATAN) 0.005 % ophthalmic solution Place 1 drop into both eyes at bedtime.   Yes [provider]  levothyroxine (SYNTHROID, LEVOTHROID) 150 MCG tablet Take 1 tablet  (150 mcg total) by mouth daily before breakfast. 11/07/15  Yes Elmahi, Rae Lips, MD  simvastatin (ZOCOR) 20 MG tablet Take 20 mg by mouth daily.   Yes [provider]  amoxicillin (AMOXIL) 500 MG capsule Take 1 capsule (500 mg total) by mouth every 8 (eight) hours. Patient not taking: Reported on 01/20/2017 06/01/16   Orson Eva, MD   Physical Exam: Vitals:   02/24/17 1230 02/24/17 1411  BP: 133/64 131/65  Pulse: 63 (!) 55  Resp: 16 19  Temp:       General:  EOMI NCAT faint drooping of the mouth  Eyes: No icterus no pallor extraocular movements intact  ENT: No submandibular adenopathy  Neck: Soft supple no JVD  Cardiovascular: S1-S2 seems to be in regular rhythm at present time no murmur  Respiratory: Clinically clear no added sound  Abdomen: Soft nontender no rebound  Skin: No lower extremity edema  Musculoskeletal: Point tenderness outside of the fourth or fifth thoracic vertebra, reproducible by direct pressure over  Psychiatric: Euthymic pleasant alert  Neurologic: Power 5/5 in left upper and lower extremity without any deficits reflexes sort of within her normal range she is able to verbalize well sensory is intact grossly  Labs on Admission:  Basic Metabolic Panel:  Recent Labs Lab 02/24/17 1245  NA 138  K 3.5  CL 102  CO2 26  GLUCOSE 124*  BUN 19  CREATININE 0.97  CALCIUM 9.4   Liver Function Tests:  Recent Labs Lab 02/24/17 1245  AST 22  ALT 13*  ALKPHOS 100  BILITOT 1.3*  PROT 7.4  ALBUMIN 3.9   No results for input(s): LIPASE, AMYLASE in the last 168 hours. No results for input(s): AMMONIA in the last 168 hours. CBC:  Recent Labs Lab 02/24/17 1245  WBC 10.0  NEUTROABS 7.0  HGB 15.5*  HCT 45.9  MCV 95.4  PLT 215   Cardiac Enzymes: No results for input(s): CKTOTAL, CKMB, CKMBINDEX, TROPONINI in the last 168 hours.  BNP (last 3 results) No results for input(s): BNP in the last 8760 hours.  ProBNP (last 3 results) No results  for input(s): PROBNP in the last 8760 hours.  CBG: No results for input(s): GLUCAP in the last 168 hours.  Radiological Exams on Admission: Dg Chest 2 View  Result Date: 02/24/2017 CLINICAL DATA:  Chronic left shoulder pain. Generalized weakness today. Hx thyroid cancer. Hx a-fib. Hx htn. Hx stroke. Right side weakness since stroke. EXAM: CHEST  2 VIEW COMPARISON:  05/30/2016 FINDINGS: The cardiac silhouette is mildly enlarged. No mediastinal or hilar masses. No convincing adenopathy. Lungs are clear.  No pleural effusion.  No pneumothorax. There are advanced arthropathic changes of the left glenohumeral joint with marked joint space narrowing remottling of the acetabulum and humeral head and subchondral sclerosis and osteophytes. This is similar to the prior study. Right glenohumeral joint is relatively well preserved. Bones are diffusely demineralized. IMPRESSION: 1. No acute cardiopulmonary disease. 2. Advanced arthropathic changes of the left glenohumeral joint. This is asymmetric suggesting posttraumatic osteoarthritis as a likely  etiology. Electronically Signed   By: Lajean Manes M.D.   On: 02/24/2017 13:49   Dg Shoulder Left  Result Date: 02/24/2017 CLINICAL DATA:  Chronic left shoulder pain, no known injury, initial encounter EXAM: LEFT SHOULDER - 2+ VIEW COMPARISON:  None. FINDINGS: Mild degenerative changes of the acromioclavicular joint are noted. Marked degenerative changes of the glenohumeral joint are seen with subchondral sclerosis and cyst formation in significant remodeling of the left humeral head. Some calcifications are noted inferior to the joint likely related to small loose bodies. No acute fracture or dislocation is seen. IMPRESSION: Severe degenerative changes of the glenohumeral articulation. Electronically Signed   By: Inez Catalina M.D.   On: 02/24/2017 13:51    EKG: Independently reviewed. H of fibrillation rate controlled leftward axis -10 no ST-T wave changes low  voltage complexes  Assessment/Plan  Asymptomatic bacteriuria-without fever chills or further signs and symptoms of infection and minimally elevated lactate would do no more than may be given IV fluids over the next 24 hours. If she spikes a fever reasonable to do blood culture and follow urine culture. Would not treat at this time  Musculoskeletal skeletal aches and pains-probably related to dependence on left side of body for ambulation. I will get a dedicated T-spine series on the patient to see and rule out any thoracic fracture she has not had any falls recently. We will speak with orthopedics if she has no relief I will add Flexeril 5 mg to her medications and we will reevaluate  Atrial fibrillation Mali score about 7 not on Coumadin Continue Cardizem 60 twice a day, keep on telemetry for the time being Continue aspirin 81 mg  Large basal ganglia stroke in the past High risk for recurrence however rate controlled at present time We will ask therapy to give recommendations regarding her debility-family has already discussed option of placing her at a skilled facility and I have explained to them clearly she may meet some criteria and but this might be out of pocket and are social work will clarify the same  Hypothyroidism continue Synthroid 150 g  Hypertension continue Ziac 1 tablet daily  Hyperlipidemia continue Zocor 20 may need to replace with Lipitor higher dose given risk of stroke     observation Long discussion with family at bedside DO NOT RESUSCITATE  Telemetry  Expect discharge in 24 hours maximum 68  Time spent [place], [facility] Pager [phone]  If 7PM-7AM, please contact night-coverage [email or web address] Password [email or web address] 02/24/2017, 3:52 PM

## 2017-02-24 NOTE — ED Notes (Signed)
Bed: HW29 Expected date:  Expected time:  Means of arrival:  Comments: EMS 83 yo, Shoulder pain

## 2017-02-24 NOTE — ED Provider Notes (Signed)
[place] DEPT Provider Note   CSN: [id number] Arrival date & time: 02/24/17  1111     History   Chief Complaint Chief Complaint  Patient presents with  . Shoulder Pain    HPI Jillian Cantrell is a 83 y.o. female.  HPI   Last night reported left shoulder pain, began yesterday. Patient the reports it has been intermittent over the last month, unclear time period. This morning she did not answer phone when family called  Had shoulder pain before, told it was kidney stones.   Reports yesterday had more generalized weakness than normal, not able to get up as readily as she normally does with cane, didn't look like she felt well.  Severe generalized weakness.     Past Medical History:  Diagnosis Date  . Atrial fibrillation with normal ventricular rate ([place]) 10/2015   Rate control, no anticoagulation secondary to GI bleed  . Cancer [facility])    thyroid cancer s/p thyroidectomy  . Hypertension   . Iatrogenic hypothyroidism   . Stroke [facility]) 2003   Chronic right-sided weakness  . Thyroid disease     Patient Active Problem List   Diagnosis Date Noted  . Weakness 02/24/2017  . 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 UTI (lower urinary tract infection) 05/30/2016  . Atrial fibrillation with normal ventricular rate ([place])   . Thyroid disease   . Iatrogenic hypothyroidism   . Atrial fibrillation ([place])   . Hypothyroidism 11/05/2015  . Rectal bleeding   . Acute blood loss anemia 11/02/2015  . GI bleed 11/01/2015  . Fall 11/01/2015  . Physical deconditioning 11/01/2015  . Atrial fibrillation, new onset ([place]) 11/01/2015  . Hypotension 11/01/2015  . History of CVA (cerebrovascular accident) 11/01/2015  . HLD (hyperlipidemia)     Past Surgical History:  Procedure Laterality Date  . ABDOMINAL HYSTERECTOMY    . APPENDECTOMY    . CHOLECYSTECTOMY    . THYROIDECTOMY    . VARICOSE VEIN SURGERY      OB History    No data available       Home Medications    Prior to  Admission medications   Medication Sig Start Date End Date Taking? Authorizing Provider  aspirin 81 MG EC tablet Take 81 mg by mouth daily. Swallow whole.   Yes [provider]  bisoprolol-hydrochlorothiazide (ZIAC) 2.5-6.25 MG per tablet Take 1 tablet by mouth daily.   Yes [provider]  diltiazem (CARDIZEM) 120 MG tablet Take 0.5 tablets (60 mg total) by mouth 2 (two) times daily. 11/07/15  Yes Verlee Monte, MD  latanoprost (XALATAN) 0.005 % ophthalmic solution Place 1 drop into both eyes at bedtime.   Yes [provider]  levothyroxine (SYNTHROID, LEVOTHROID) 150 MCG tablet Take 1 tablet (150 mcg total) by mouth daily before breakfast. 11/07/15  Yes Elmahi, Rae Lips, MD  simvastatin (ZOCOR) 20 MG tablet Take 20 mg by mouth daily.   Yes [provider]  amoxicillin (AMOXIL) 500 MG capsule Take 1 capsule (500 mg total) by mouth every 8 (eight) hours. Patient not taking: Reported on 01/20/2017 06/01/16   Orson Eva, MD    Family History Family History  Problem Relation Age of Onset  . Cancer Father        leukemia    Social History Social History  Substance Use Topics  . Smoking status: Former Smoker    Quit date: 10/14/1968  . Smokeless tobacco: Never Used  . Alcohol use No     Allergies   Sulfa antibiotics  and Codeine   Review of Systems Review of Systems  Constitutional: Positive for fatigue. Negative for fever.  HENT: Negative for sore throat.   Eyes: Negative for visual disturbance.  Respiratory: Negative for cough and shortness of breath.   Cardiovascular: Negative for chest pain.  Gastrointestinal: Negative for abdominal pain, diarrhea, nausea and vomiting.  Genitourinary: Negative for difficulty urinating and dysuria.  Musculoskeletal: Positive for arthralgias. Negative for back pain and neck pain.  Skin: Negative for rash.  Neurological: Negative for syncope and headaches.     Physical Exam Updated Vital Signs BP 115/61 (BP  Location: Left Arm)   Pulse 61   Temp 98.7 F (37.1 C) (Axillary)   Resp 20   Ht 5\' 8"  (1.727 m)   Wt 190 lb 7.6 oz (86.4 kg)   SpO2 94%   BMI 28.96 kg/m   Physical Exam  Constitutional: She is oriented to person, place, and time. She appears well-developed and well-nourished. No distress.  Appears generally weak  HENT:  Head: Normocephalic and atraumatic.  Eyes: Conjunctivae and EOM are normal.  Neck: Normal range of motion.  Cardiovascular: Normal rate, regular rhythm, normal heart sounds and intact distal pulses.  Exam reveals no gallop and no friction rub.   No murmur heard. Pulmonary/Chest: Effort normal and breath sounds normal. No respiratory distress. She has no wheezes. She has no rales.  Abdominal: Soft. She exhibits no distension. There is no tenderness. There is no guarding.  Musculoskeletal: She exhibits no edema.       Cervical back: She exhibits tenderness (left trapezius muscle strain).  Neurological: She is alert and oriented to person, place, and time.  Spastic hemiplegia RUE, right weakness in comparison to left new per family Gen weakness  Skin: Skin is warm and dry. No rash noted. She is not diaphoretic. No erythema.  Nursing note and vitals reviewed.    ED Treatments / Results  Labs (all labs ordered are listed, but only abnormal results are displayed) Labs Reviewed  CBC WITH DIFFERENTIAL/PLATELET - Abnormal; Notable for the following:       Result Value   Hemoglobin 15.5 (*)    Monocytes Absolute 1.6 (*)    All other components within normal limits  COMPREHENSIVE METABOLIC PANEL - Abnormal; Notable for the following:    Glucose, Bld 124 (*)    ALT 13 (*)    Total Bilirubin 1.3 (*)    GFR calc non Af Amer 51 (*)    GFR calc Af Amer 59 (*)    All other components within normal limits  URINALYSIS, ROUTINE W REFLEX MICROSCOPIC - Abnormal; Notable for the following:    APPearance HAZY (*)    Hgb urine dipstick SMALL (*)    Nitrite POSITIVE (*)     Leukocytes, UA MODERATE (*)    Bacteria, UA FEW (*)    Squamous Epithelial / LPF 6-30 (*)    Non Squamous Epithelial 0-5 (*)    All other components within normal limits  CULTURE, BLOOD (ROUTINE X 2)  COMPREHENSIVE METABOLIC PANEL  CBC  I-STAT TROPOININ, ED  I-STAT TROPOININ, ED  I-STAT CG4 LACTIC ACID, ED  I-STAT TROPOININ, ED  I-STAT CG4 LACTIC ACID, ED    EKG  EKG Interpretation  Date/Time:  Monday Feb 24 2017 12:48:55 EDT Ventricular Rate:  46 PR Interval:    QRS Duration: 91 QT Interval:  424 QTC Calculation: 371 R Axis:   -4 Text Interpretation:  Atrial fibrillation Anterior infarct, old No significant change  since last tracing Confirmed by [facility] MD, [place] ([id number]) on 02/24/2017 12:53:45 PM       Radiology Dg Chest 2 View  Result Date: 02/24/2017 CLINICAL DATA:  Chronic left shoulder pain. Generalized weakness today. Hx thyroid cancer. Hx a-fib. Hx htn. Hx stroke. Right side weakness since stroke. EXAM: CHEST  2 VIEW COMPARISON:  05/30/2016 FINDINGS: The cardiac silhouette is mildly enlarged. No mediastinal or hilar masses. No convincing adenopathy. Lungs are clear.  No pleural effusion.  No pneumothorax. There are advanced arthropathic changes of the left glenohumeral joint with marked joint space narrowing remottling of the acetabulum and humeral head and subchondral sclerosis and osteophytes. This is similar to the prior study. Right glenohumeral joint is relatively well preserved. Bones are diffusely demineralized. IMPRESSION: 1. No acute cardiopulmonary disease. 2. Advanced arthropathic changes of the left glenohumeral joint. 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 Electronically Signed   By: Lajean Manes M.D.   On: 02/24/2017 13:49   Dg Thoracic Spine 2 View  Result Date: 02/24/2017 CLINICAL DATA:  History of CVA with right-sided weakness, tenderness to the mid thoracic area EXAM: THORACIC SPINE 2 VIEWS COMPARISON:   02/24/2017, 01/07/2015 FINDINGS: Surgical clips in the upper mediastinum. Aortic atherosclerosis. Sagittal alignment is within normal limits. Multilevel osteophytes. Vertebral body heights are maintained. IMPRESSION: Degenerative changes.  No definite acute osseous abnormality. Electronically Signed   By: Donavan Foil M.D.   On: 02/24/2017 19:05   Dg Shoulder Left  Result Date: 02/24/2017 CLINICAL DATA:  Chronic left shoulder pain, no known injury, initial encounter EXAM: LEFT SHOULDER - 2+ VIEW COMPARISON:  None. FINDINGS: Mild degenerative changes of the acromioclavicular joint are noted. Marked degenerative changes of the glenohumeral joint are seen with subchondral sclerosis and cyst formation in significant remodeling of the left humeral head. Some calcifications are noted inferior to the joint likely related to small loose bodies. No acute fracture or dislocation is seen. IMPRESSION: Severe degenerative changes of the glenohumeral articulation. Electronically Signed   By: Inez Catalina M.D.   On: 02/24/2017 13:51    Procedures Procedures (including critical care time)  Medications Ordered in ED Medications  acetaminophen (TYLENOL) tablet 650 mg (650 mg Oral Given 02/24/17 2355)  cyclobenzaprine (FLEXERIL) tablet 5 mg (5 mg Oral Given 02/24/17 2126)  diltiazem (CARDIZEM) tablet 60 mg (60 mg Oral Given 02/24/17 2125)  levothyroxine (SYNTHROID, LEVOTHROID) tablet 150 mcg (not administered)  aspirin EC tablet 81 mg (not administered)  bisoprolol-hydrochlorothiazide (ZIAC) 2.5-6.25 MG per tablet 1 tablet (not administered)  simvastatin (ZOCOR) tablet 20 mg (not administered)  enoxaparin (LOVENOX) injection 40 mg (40 mg Subcutaneous Given 02/24/17 2126)  aspirin chewable tablet 324 mg (324 mg Oral Given 02/24/17 1507)     Initial Impression / Assessment and Plan / ED Course  I have reviewed the triage vital signs and the nursing notes.  Pertinent labs & imaging results that were available  during my care of the patient were reviewed by me and considered in my medical decision making (see chart for details).    83 year old female with a history of thyroid cancer, atrial fibrillation, hypertension, CVA presents with concern for left shoulder pain and family reports generalized weakness since yesterday.  Left shoulder pain likely muscle strain given tenderness on exam.  Patient with severe generalized weakness beginning yesterday and present on exam. Labs show UTI.  Given significant weakness in a patient who lives alone, will admit for treatment of UTI. Blood cx ordered, rocephin given.  Final Clinical Impressions(s) / ED Diagnoses   Final diagnoses:  Urinary tract infection without hematuria, site unspecified  Generalized weakness  Acute left-sided thoracic back pain    New Prescriptions Current Discharge Medication List       Gareth Morgan, MD 02/25/17 [phone]

## 2017-02-25 DIAGNOSIS — G464 Cerebellar stroke syndrome: Secondary | ICD-10-CM | POA: Diagnosis not present

## 2017-02-25 DIAGNOSIS — R531 Weakness: Secondary | ICD-10-CM | POA: Diagnosis not present

## 2017-02-25 DIAGNOSIS — S239XXA Sprain of unspecified parts of thorax, initial encounter: Secondary | ICD-10-CM | POA: Diagnosis not present

## 2017-02-25 DIAGNOSIS — G8190 Hemiplegia, unspecified affecting unspecified side: Secondary | ICD-10-CM | POA: Diagnosis not present

## 2017-02-25 LAB — COMPREHENSIVE METABOLIC PANEL
ALK PHOS: 92 U/L (ref 38–126)
ALT: 13 U/L — AB (ref 14–54)
AST: 19 U/L (ref 15–41)
Albumin: 3.4 g/dL — ABNORMAL LOW (ref 3.5–5.0)
Anion gap: 7 (ref 5–15)
BUN: 21 mg/dL — ABNORMAL HIGH (ref 6–20)
CALCIUM: 9.2 mg/dL (ref 8.9–10.3)
CO2: 27 mmol/L (ref 22–32)
CREATININE: 1.13 mg/dL — AB (ref 0.44–1.00)
Chloride: 105 mmol/L (ref 101–111)
GFR, EST AFRICAN AMERICAN: 49 mL/min — AB (ref >60)
GFR, EST NON AFRICAN AMERICAN: 42 mL/min — AB (ref >60)
Glucose, Bld: 106 mg/dL — ABNORMAL HIGH (ref 65–99)
Potassium: 3.5 mmol/L (ref 3.5–5.1)
Sodium: 139 mmol/L (ref 135–145)
Total Bilirubin: 0.8 mg/dL (ref 0.3–1.2)
Total Protein: 6.6 g/dL (ref 6.5–8.1)

## 2017-02-25 LAB — CBC
HCT: 43.5 % (ref 36.0–46.0)
Hemoglobin: 14.8 g/dL (ref 12.0–15.0)
MCH: 32.4 pg (ref 26.0–34.0)
MCHC: 34 g/dL (ref 30.0–36.0)
MCV: 95.2 fL (ref 78.0–100.0)
PLATELETS: 186 10*3/uL (ref 150–400)
RBC: 4.57 MIL/uL (ref 3.87–5.11)
RDW: 13.7 % (ref 11.5–15.5)
WBC: 6.8 10*3/uL (ref 4.0–10.5)

## 2017-02-25 MED ORDER — ATORVASTATIN CALCIUM 10 MG PO TABS: 10.0000 mg | ORAL_TABLET | Freq: Every day | ORAL | 0 refills | Status: DC

## 2017-02-25 MED ORDER — DICLOFENAC EPOLAMINE 1.3 % TD PTCH
1.0000 | MEDICATED_PATCH | Freq: Two times a day (BID) | TRANSDERMAL | Status: DC
  Administered 2017-02-25: 1 via TRANSDERMAL
  Filled 2017-02-25 (×2): qty 1

## 2017-02-25 MED ORDER — CYCLOBENZAPRINE HCL 5 MG PO TABS: 5.0000 mg | ORAL_TABLET | Freq: Every day | ORAL | 0 refills | Status: DC

## 2017-02-25 MED ORDER — ATORVASTATIN CALCIUM 10 MG PO TABS: 10.0000 mg | ORAL_TABLET | Freq: Every day | ORAL | Status: DC

## 2017-02-25 MED ORDER — DICLOFENAC EPOLAMINE 1.3 % TD PTCH: 1.0000 | MEDICATED_PATCH | Freq: Two times a day (BID) | TRANSDERMAL | 0 refills | Status: DC

## 2017-02-25 NOTE — Evaluation (Addendum)
Physical Therapy Evaluation Patient Details Name: Jillian Cantrell MRN: [id number] DOB: 1927/08/06 Today's Date: 02/25/2017   History of Present Illness  83 yo female admitted with weakness. Hx of R hemiplegia, CVA, HTN, thyroid cancer.   Clinical Impression  On eval, pt required Min-Mod assist +2 for mobility. She was able to perform a stand pivot x 2 and ambulate with use of quad cane. Pt does not have any functional use of R UE. Fair strength in R LE with the exception of DF/PF. She participated well with therapy. Recommend continued rehab at [facility].     Follow Up Recommendations SNF    Equipment Recommendations   (TBD at next venue)    Recommendations for Other Services       Precautions / Restrictions Precautions Precautions: Fall Precaution Comments: R hemiplegia Restrictions Weight Bearing Restrictions: No      Mobility  Bed Mobility Overal bed mobility: Needs Assistance Bed Mobility: Supine to Sit     Supine to sit: Mod assist;+2 for physical assistance;+2 for safety/equipment;HOB elevated     General bed mobility comments: Assist for trunk and bil LEs. Increased time. Utilized bedpad to assist with scooting  Transfers Overall transfer level: Needs assistance Equipment used: Quad cane Transfers: Sit to/from [employer] to Stand: Min assist;+2 physical assistance;+2 safety/equipment;From elevated surface Stand pivot transfers: Min assist;+2 safety/equipment;From elevated surface       General transfer comment: Assist to rise, stabilize, control descent. VCs safety, technique, hand placement, feet placement. STand pivot x2, recliner<>bsc.   Ambulation/Gait Ambulation/Gait assistance: Min assist;+2 safety/equipment Distance: 30 feet Assistive device: Quad cane Gait Pattern/deviations: Step-to pattern     General Gait Details: Assist to stabilize, weightshift. Slow gait speed. VCs safety, sequence. Some difficulty advancing R LE  intermittently.  Stairs            Wheelchair Mobility    Modified Rankin (Stroke Patients Only)       Balance                                             Pertinent Vitals/Pain Pain Assessment: No/denies pain    Home Living Family/patient expects to be discharged to:: Skilled nursing facility                      Prior Function                 Hand Dominance        Extremity/Trunk Assessment   Upper Extremity Assessment Upper Extremity Assessment: RUE deficits/detail RUE Deficits / Details: no functional use of R UE    Lower Extremity Assessment Lower Extremity Assessment: RLE deficits/detail RLE Deficits / Details: Strength ~3/5 except DF 2/5    Cervical / Trunk Assessment Cervical / Trunk Assessment: Normal  Communication   Communication: No difficulties  Cognition Arousal/Alertness: Awake/alert Behavior During Therapy: WFL for tasks assessed/performed Overall Cognitive Status: Within Functional Limits for tasks assessed                                        General Comments      Exercises     Assessment/Plan    PT Assessment All further PT needs can be met in the next venue of care (SNF rehab)  PT Problem List Decreased strength;Decreased mobility;Decreased activity tolerance;Decreased balance;Decreased knowledge of use of DME       PT Treatment Interventions DME instruction;Gait training;Therapeutic activities;Therapeutic exercise;Patient/family education;Balance training;Functional mobility training    PT Goals (Current goals can be found in the Care Plan section)  Acute Rehab PT Goals Patient Stated Goal: family would like pt to walk more PT Goal Formulation: With patient/family Time For Goal Achievement: 03/11/17 Potential to Achieve Goals: Fair    Frequency     Barriers to discharge        Co-evaluation               AM-PAC PT "6 Clicks" Daily Activity  Outcome  Measure Difficulty turning over in bed (including adjusting bedclothes, sheets and blankets)?: A Lot Difficulty moving from lying on back to sitting on the side of the bed? : A Lot Difficulty sitting down on and standing up from a chair with arms (e.g., wheelchair, bedside commode, etc,.)?: A Lot Help needed moving to and from a bed to chair (including a wheelchair)?: A Lot Help needed walking in hospital room?: A Lot Help needed climbing 3-5 steps with a railing? : Total 6 Click Score: 11    End of Session Equipment Utilized During Treatment: Gait belt Activity Tolerance: Patient tolerated treatment well Patient left: in chair;with call bell/phone within reach;with family/visitor present   PT Visit Diagnosis: Muscle weakness (generalized) (M62.81);Difficulty in walking, not elsewhere classified (R26.2)    Time: 1980-2217 PT Time Calculation (min) (ACUTE ONLY): 25 min   Charges:   PT Evaluation $PT Eval Moderate Complexity: 1 Procedure PT Treatments $Gait Training: 8-22 mins   PT G Codes:   PT G-Codes **NOT FOR INPATIENT CLASS** Functional Assessment Tool Used: AM-PAC 6 Clicks Basic Mobility;Clinical judgement Functional Limitation: Mobility: Walking and moving around Mobility: Walking and Moving Around Current Status (V8102): At least 40 percent but less than 60 percent impaired, limited or restricted Mobility: Walking and Moving Around Goal Status [phone]): At least 40 percent but less than 60 percent impaired, limited or restricted Mobility: Walking and Moving Around Discharge Status [phone]): At least 40 percent but less than 60 percent impaired, limited or restricted      Weston Anna, MPT Pager: [phone]

## 2017-02-25 NOTE — Discharge Summary (Signed)
Physician Discharge Summary  Jillian Cantrell VEL:[id number] DOB: November 10, 1926 DOA: 02/24/2017  PCP: Burnard Bunting, MD  Admit date: 02/24/2017 Discharge date: 02/25/2017  Time spent: 25 minutes  Recommendations for Outpatient Follow-up:  1. Patient needs skilled therapy at facility to maximize utilization of the left side of her body with strategies to prevent pain and compensated spasm secondary to overuse on the left side-this would best be done in a facility where she can have regular daily therapy and coping strategies for the same 2. Recommend trial of diclofenac patch over the area of tenderness in the upper back-if the pain does not resolve or subside with combination of this and Tylenol, would recommend CT of the back to rule out occult fracture-x-ray of the back was negative 3. Continue all other medications  Discharge Diagnoses:  Active Problems:   Weakness   Discharge Condition: Improved  Diet recommendation: Heart healthy  Filed Weights   02/24/17 1732  Weight: 86.4 kg (190 lb 7.6 oz)    History of present illness:  Jillian Cantrell is a 83 y.o. 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 complicated I if before meals at that time Prior meningioma of the brain Hypertension Hypothyroidism Hyperlipidemia Last admission 05/2016 Escherichia coli cystitis  She was admitted midnight 02/24/2017 with concerns of confusion as well as significant pain in the left posterior shoulder and upper trapezius It was felt initially she might have had a urinary infection however patient was completely clear to discussion and had just not slept well over the course of the past 2-3 nights and was anxious on admission she had asymptomatic bacteria and was able to recall  events antecedent to admission  Renal function was within normal limits white,*elevated chest x-ray was normal  On exam she had significant spasm in Her paravertebral musculature and x-ray was ordered which was negative, she has 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 shoulder as well and this may be compensated secondary to her overuse of the left side of her body secondary to prior significant stroke leaving her with a dense right hemiparesis  She was observed overnight slept well with low-dose Flexeril and offered Tylenol on a more scheduled frequency she was also recommended to use diclofenac patch as she has a preference for not taking oral medications and it was felt that patient was hemodynamically stabilized and able to discharge to skilled facility for short-term rehabilitation  Discharge Exam: Vitals:   02/25/17 0345 02/25/17 1226  BP: 137/72 (!) 155/75  Pulse: 79 79  Resp: 16 18  Temp: 98.4 F (36.9 C) 98.2 F (36.8 C)    General: alert pleasant oriented in nad Cardiovascular: s1 s 2no m/r/g Respiratory: clear no added sound abd soft nt nd no rebound Tender in upper trap, no stepmoff or boney discontinuity   Discharge Instructions   Discharge Instructions    Diet - low sodium heart healthy    Complete by:  As directed    Increase activity slowly    Complete by:  As directed      Current Discharge Medication List    START taking these medications   Details  atorvastatin (LIPITOR) 10 MG tablet Take 1 tablet (10 mg total) by mouth daily at 6 PM. Qty:  40 tablet, Refills: 0    cyclobenzaprine (FLEXERIL) 5 MG tablet Take 1 tablet (5 mg total) by mouth at bedtime. Qty: 30 tablet, Refills: 0    diclofenac (FLECTOR) 1.3 % PTCH Place 1 patch onto the skin 2 (two) times daily. Qty: 20 patch, Refills: 0      CONTINUE these medications which have NOT CHANGED   Details  aspirin 81 MG EC tablet Take 81 mg by mouth daily. Swallow whole.    bisoprolol-hydrochlorothiazide (ZIAC)  2.5-6.25 MG per tablet Take 1 tablet by mouth daily.    brimonidine (ALPHAGAN) 0.15 % ophthalmic solution Place 1 drop into both eyes 2 (two) times daily.    Bromfenac Sodium (BROMSITE) 0.075 % SOLN Place 1 drop into the right eye 2 (two) times daily.    diltiazem (CARDIZEM) 120 MG tablet Take 0.5 tablets (60 mg total) by mouth 2 (two) times daily.    latanoprost (XALATAN) 0.005 % ophthalmic solution Place 1 drop into both eyes at bedtime.    levothyroxine (SYNTHROID, LEVOTHROID) 150 MCG tablet Take 1 tablet (150 mcg total) by mouth daily before breakfast.    Loteprednol Etabonate 0.5 % GEL Place 1 drop into the right eye 4 (four) times daily.      STOP taking these medications     simvastatin (ZOCOR) 20 MG tablet        Allergies  Allergen Reactions  . Sulfa Antibiotics Other (See Comments)    Reaction:  Unknown  . Codeine Hives      The results of significant diagnostics from this hospitalization (including imaging, microbiology, ancillary and laboratory) are listed below for reference.    Significant Diagnostic Studies: Dg Chest 2 View  Result Date: 02/24/2017 CLINICAL DATA:  Chronic left shoulder pain. Generalized weakness today. Hx thyroid cancer. Hx a-fib. Hx htn. Hx stroke. Right side weakness since stroke. EXAM: CHEST  2 VIEW COMPARISON:  05/30/2016 FINDINGS: The cardiac silhouette is mildly enlarged. No mediastinal or hilar masses. No convincing adenopathy. Lungs are clear.  No pleural effusion.  No pneumothorax. There are advanced arthropathic changes of the left glenohumeral joint with marked joint space narrowing remottling of the acetabulum and humeral head and subchondral sclerosis and osteophytes. This is similar to the prior study. Right glenohumeral joint is relatively well preserved. Bones are diffusely demineralized. IMPRESSION: 1. No acute cardiopulmonary disease. 2. Advanced arthropathic changes of the left glenohumeral joint. 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 Electronically Signed   By: Lajean Manes M.D.   On: 02/24/2017 13:49   Dg Thoracic Spine 2 View  Result Date: 02/24/2017 CLINICAL DATA:  History of CVA with right-sided weakness, tenderness to the mid thoracic area EXAM: THORACIC SPINE 2 VIEWS COMPARISON:  02/24/2017, 01/07/2015 FINDINGS: Surgical clips in the upper mediastinum. Aortic atherosclerosis. Sagittal alignment is within normal limits. Multilevel osteophytes. Vertebral body heights are maintained. IMPRESSION: Degenerative changes.  No definite acute osseous abnormality. Electronically Signed   By: Donavan Foil M.D.   On: 02/24/2017 19:05   Dg Shoulder Left  Result Date: 02/24/2017 CLINICAL DATA:  Chronic left shoulder pain, no known injury, initial encounter EXAM: LEFT SHOULDER - 2+ VIEW COMPARISON:  None. FINDINGS: Mild degenerative changes of the acromioclavicular joint are noted. Marked degenerative changes of the glenohumeral joint are seen with subchondral sclerosis and cyst formation in significant remodeling of the left humeral head. Some calcifications are noted inferior to the joint likely related to small loose bodies. No acute fracture or dislocation is seen.  IMPRESSION: Severe degenerative changes of the glenohumeral articulation. Electronically Signed   By: Inez Catalina M.D.   On: 02/24/2017 13:51   Dg Foot Complete Left  Result Date: 02/12/2017 Please see detailed radiograph report in office note.   Microbiology: Recent Results (from the past 240 hour(s))  Blood culture (routine x 2)     Status: None (Preliminary result)   Collection Time: 02/24/17  4:10 PM  Result Value Ref Range Status   Specimen Description BLOOD LEFT ANTECUBITAL  Final   Special Requests   Final    BOTTLES DRAWN AEROBIC AND ANAEROBIC Blood Culture adequate volume   Culture   Final    NO GROWTH < 24 HOURS Performed at [facility], [address] [place], [place] [address]    Report  Status PENDING  Incomplete     Labs: Basic Metabolic Panel:  Recent Labs Lab 02/24/17 1245 02/25/17 0618  NA 138 139  K 3.5 3.5  CL 102 105  CO2 26 27  GLUCOSE 124* 106*  BUN 19 21*  CREATININE 0.97 1.13*  CALCIUM 9.4 9.2   Liver Function Tests:  Recent Labs Lab 02/24/17 1245 02/25/17 0618  AST 22 19  ALT 13* 13*  ALKPHOS 100 92  BILITOT 1.3* 0.8  PROT 7.4 6.6  ALBUMIN 3.9 3.4*   No results for input(s): LIPASE, AMYLASE in the last 168 hours. No results for input(s): AMMONIA in the last 168 hours. CBC:  Recent Labs Lab 02/24/17 1245 02/25/17 0618  WBC 10.0 6.8  NEUTROABS 7.0  --   HGB 15.5* 14.8  HCT 45.9 43.5  MCV 95.4 95.2  PLT 215 186   Cardiac Enzymes: No results for input(s): CKTOTAL, CKMB, CKMBINDEX, TROPONINI in the last 168 hours. BNP: BNP (last 3 results) No results for input(s): BNP in the last 8760 hours.  ProBNP (last 3 results) No results for input(s): PROBNP in the last 8760 hours.  CBG: No results for input(s): GLUCAP in the last 168 hours.     SignedNita Sells MD   [facility] 02/25/2017, 1:54 PM

## 2017-02-25 NOTE — Clinical Social Work Note (Signed)
Ptar called for patient transport to [facility].  Dede Query, [place] Social Worker -

## 2017-02-25 NOTE — Care Management Note (Signed)
Case Management Note  Patient Details  Name: Jillian Cantrell MRN: [id number] Date of Birth: December 01, 1926  Subjective/Objective:    PT-recc SNF-CSW notified,following for SNF.                Action/Plan:d/c SNF.   Expected Discharge Date:  02/25/17               Expected Discharge Plan:  Skilled Nursing Facility  In-House Referral:  Clinical Social Work  Discharge planning Services  CM Consult  Post Acute Care Choice:    Choice offered to:     DME Arranged:    DME Agency:     HH Arranged:    [place] Agency:     Status of Service:  Completed, signed off  If discussed at [employer], dates discussed:    Additional Comments:  Dessa Phi, RN 02/25/2017, 2:42 PM

## 2017-02-25 NOTE — Clinical Social Work Note (Signed)
LCSW placed DNR on patient chart for MD signature.  Patient is due to discharge later this afternoon to [facility]. Awaiting PT evaluation.   Dede Query, LCSW [facility] Clinical Social Worker - cell #:

## 2017-02-25 NOTE — Care Management Note (Signed)
Case Management Note  Patient Details  Name: MARLISE FAHR MRN: [id number] Date of Birth: 07-22-1927  Subjective/Objective: 83 y/o f admitted w/weakness. From home, alone, has cane, rw.DNR. PT cons-await recc. CSW already following for LTC-SNF.                   Action/Plan:d/c SNF.   Expected Discharge Date:  02/25/17               Expected Discharge Plan:  Skilled Nursing Facility  In-House Referral:  Clinical Social Work  Discharge planning Services  CM Consult  Post Acute Care Choice:    Choice offered to:     DME Arranged:    DME Agency:     HH Arranged:    [place] Agency:     Status of Service:  In process, will continue to follow  If discussed at Long Length of Stay Meetings, dates discussed:    Additional Comments:  Dessa Phi, RN 02/25/2017, 1:04 PM

## 2017-02-25 NOTE — Care Management Obs Status (Signed)
[place] NOTIFICATION   Patient Details  Name: Jillian Cantrell MRN: [id number] Date of Birth: 09/08/1927   Medicare Observation Status Notification Given:  Yes    Dessa Phi, RN 02/25/2017, 1:03 PM

## 2017-02-25 NOTE — Progress Notes (Signed)
The order for simvastatin(Zocor) was changed to an equivalent dose of atorvastatin(Lipitor) due to the potential drug interaction with diltiazem  When taken in combination with medications that inhibit its metabolism, simvastatin can accumulate which increases the risk of liver toxicity, myopathy, or rhabdomyolysis.  Simvastatin dose should not exceed 10mg /day in patients taking verapamil, diltiazem, fibrates, or niacin >or= 1g/day.   Simvastatin dose should not exceed 20mg /day in patients taking amlodipine, ranolazine or amiodarone.   Please consider this potential interaction at discharge.  Dorrene German 02/25/2017 12:50 AM

## 2017-02-25 NOTE — Clinical Social Work Note (Signed)
Patient has a bed at [facility] when ready for discharge.  Awaiting PT evaluation.  Patient will be transferred via [place].  Marland KitchenDede Query, [facility] Clinical Social Worker - cell #: [phone]

## 2017-02-25 NOTE — NC FL2 (Signed)
[place] LEVEL OF CARE SCREENING TOOL     IDENTIFICATION  Patient Name: Jillian Cantrell Birthdate: July 31, 1927 Sex: female Admission Date (Current Location): 02/24/2017  [facility] and [place] Number:  Herbalist and Address:  [facility],  [place] [address], [place]      Provider Number: [id number]  Attending Physician Name and Address:  Nita Sells, MD  Relative Name and Phone Number:       Current Level of Care: Hospital Recommended Level of Care: [place] Prior Approval Number:    Date Approved/Denied:   PASRR Number: [id number]  Discharge Plan: SNF    Current Diagnoses: Patient Active Problem List   Diagnosis Date Noted  . Weakness 02/24/2017  . 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 UTI (lower urinary tract infection) 05/30/2016  . Atrial fibrillation with normal ventricular rate ([place])   . Thyroid disease   . Iatrogenic hypothyroidism   . Atrial fibrillation ([place])   . Hypothyroidism 11/05/2015  . Rectal bleeding   . Acute blood loss anemia 11/02/2015  . GI bleed 11/01/2015  . Fall 11/01/2015  . Physical deconditioning 11/01/2015  . Atrial fibrillation, new onset ([place]) 11/01/2015  . Hypotension 11/01/2015  . History of CVA (cerebrovascular accident) 11/01/2015  . HLD (hyperlipidemia)     Orientation RESPIRATION BLADDER Height & Weight     Time, Situation, Place, Self  Normal Incontinent Weight: 190 lb 7.6 oz (86.4 kg) Height:  5\' 8"  (172.7 cm)  BEHAVIORAL SYMPTOMS/MOOD NEUROLOGICAL BOWEL NUTRITION STATUS      Continent Diet (regular diet fluid consistency thin)  AMBULATORY STATUS COMMUNICATION OF NEEDS Skin   Limited Assist Verbally Normal                       Personal Care Assistance Level of Assistance  Bathing, Dressing Bathing Assistance: Limited assistance   Dressing Assistance: Limited assistance     Functional Limitations Info             SPECIAL CARE  FACTORS FREQUENCY  PT (By licensed PT), OT (By licensed OT)     PT Frequency: 5 days week OT Frequency: 5 days week            Contractures      Additional Factors Info  Code Status, Allergies Code Status Info: DNR Allergies Info: Sulfa Antibiotics, Codeine           Current Medications (02/25/2017):  This is the current hospital active medication list Current Facility-Administered Medications  Medication Dose Route Frequency Provider Last Rate Last Dose  . acetaminophen (TYLENOL) tablet 650 mg  650 mg Oral Q6H Samtani, Jai-Gurmukh, MD   650 mg at 02/25/17 0600  . aspirin EC tablet 81 mg  81 mg Oral Daily Nita Sells, MD   81 mg at 02/25/17 1028  . atorvastatin (LIPITOR) tablet 10 mg  10 mg Oral q1800 Nita Sells, MD      . bisoprolol-hydrochlorothiazide [facility]) 2.5-6.25 MG per tablet 1 tablet  1 tablet Oral Daily Nita Sells, MD   1 tablet at 02/25/17 1028  . cyclobenzaprine (FLEXERIL) tablet 5 mg  5 mg Oral QHS Nita Sells, MD   5 mg at 02/24/17 2126  . diclofenac (FLECTOR) 1.3 % 1 patch  1 patch Transdermal BID Nita Sells, MD      . diltiazem (CARDIZEM) tablet 60 mg  60 mg Oral BID Nita Sells, MD   60 mg at 02/25/17 1028  . enoxaparin (LOVENOX)  injection 40 mg  40 mg Subcutaneous Q24H Nita Sells, MD   40 mg at 02/24/17 2126  . levothyroxine (SYNTHROID, LEVOTHROID) tablet 150 mcg  150 mcg Oral QAC breakfast Nita Sells, MD   150 mcg at 02/25/17 1504     Discharge Medications: Please see discharge summary for a list of discharge medications.  Relevant Imaging Results:  Relevant Lab Results:   Additional Information [id number]  Carlean Jews, LCSW

## 2017-02-26 DIAGNOSIS — M6281 Muscle weakness (generalized): Secondary | ICD-10-CM | POA: Diagnosis not present

## 2017-02-26 DIAGNOSIS — R2681 Unsteadiness on feet: Secondary | ICD-10-CM | POA: Diagnosis not present

## 2017-02-26 DIAGNOSIS — I69351 Hemiplegia and hemiparesis following cerebral infarction affecting right dominant side: Secondary | ICD-10-CM | POA: Diagnosis not present

## 2017-02-26 DIAGNOSIS — R278 Other lack of coordination: Secondary | ICD-10-CM | POA: Diagnosis not present

## 2017-02-26 DIAGNOSIS — M19012 Primary osteoarthritis, left shoulder: Secondary | ICD-10-CM | POA: Diagnosis not present

## 2017-02-26 DIAGNOSIS — N390 Urinary tract infection, site not specified: Secondary | ICD-10-CM | POA: Diagnosis not present

## 2017-02-26 DIAGNOSIS — R41841 Cognitive communication deficit: Secondary | ICD-10-CM | POA: Diagnosis not present

## 2017-02-27 DIAGNOSIS — R2681 Unsteadiness on feet: Secondary | ICD-10-CM | POA: Diagnosis not present

## 2017-02-27 DIAGNOSIS — M6281 Muscle weakness (generalized): Secondary | ICD-10-CM | POA: Diagnosis not present

## 2017-02-27 DIAGNOSIS — R41841 Cognitive communication deficit: Secondary | ICD-10-CM | POA: Diagnosis not present

## 2017-02-27 DIAGNOSIS — R278 Other lack of coordination: Secondary | ICD-10-CM | POA: Diagnosis not present

## 2017-02-27 DIAGNOSIS — I69351 Hemiplegia and hemiparesis following cerebral infarction affecting right dominant side: Secondary | ICD-10-CM | POA: Diagnosis not present

## 2017-02-27 DIAGNOSIS — N390 Urinary tract infection, site not specified: Secondary | ICD-10-CM | POA: Diagnosis not present

## 2017-02-28 DIAGNOSIS — R2681 Unsteadiness on feet: Secondary | ICD-10-CM | POA: Diagnosis not present

## 2017-02-28 DIAGNOSIS — R41841 Cognitive communication deficit: Secondary | ICD-10-CM | POA: Diagnosis not present

## 2017-02-28 DIAGNOSIS — I69351 Hemiplegia and hemiparesis following cerebral infarction affecting right dominant side: Secondary | ICD-10-CM | POA: Diagnosis not present

## 2017-02-28 DIAGNOSIS — N390 Urinary tract infection, site not specified: Secondary | ICD-10-CM | POA: Diagnosis not present

## 2017-02-28 DIAGNOSIS — R278 Other lack of coordination: Secondary | ICD-10-CM | POA: Diagnosis not present

## 2017-02-28 DIAGNOSIS — M6281 Muscle weakness (generalized): Secondary | ICD-10-CM | POA: Diagnosis not present

## 2017-03-01 DIAGNOSIS — R278 Other lack of coordination: Secondary | ICD-10-CM | POA: Diagnosis not present

## 2017-03-01 DIAGNOSIS — R41841 Cognitive communication deficit: Secondary | ICD-10-CM | POA: Diagnosis not present

## 2017-03-01 DIAGNOSIS — R2681 Unsteadiness on feet: Secondary | ICD-10-CM | POA: Diagnosis not present

## 2017-03-01 DIAGNOSIS — I69351 Hemiplegia and hemiparesis following cerebral infarction affecting right dominant side: Secondary | ICD-10-CM | POA: Diagnosis not present

## 2017-03-01 DIAGNOSIS — M6281 Muscle weakness (generalized): Secondary | ICD-10-CM | POA: Diagnosis not present

## 2017-03-01 DIAGNOSIS — N390 Urinary tract infection, site not specified: Secondary | ICD-10-CM | POA: Diagnosis not present

## 2017-03-01 LAB — CULTURE, BLOOD (ROUTINE X 2)
Culture: NO GROWTH
Special Requests: ADEQUATE

## 2017-03-02 DIAGNOSIS — E038 Other specified hypothyroidism: Secondary | ICD-10-CM | POA: Diagnosis not present

## 2017-03-02 DIAGNOSIS — I69351 Hemiplegia and hemiparesis following cerebral infarction affecting right dominant side: Secondary | ICD-10-CM | POA: Diagnosis not present

## 2017-03-02 DIAGNOSIS — I7389 Other specified peripheral vascular diseases: Secondary | ICD-10-CM | POA: Diagnosis not present

## 2017-03-02 DIAGNOSIS — I10 Essential (primary) hypertension: Secondary | ICD-10-CM | POA: Diagnosis not present

## 2017-03-02 DIAGNOSIS — M25512 Pain in left shoulder: Secondary | ICD-10-CM | POA: Diagnosis not present

## 2017-03-02 DIAGNOSIS — I4891 Unspecified atrial fibrillation: Secondary | ICD-10-CM | POA: Diagnosis not present

## 2017-03-03 DIAGNOSIS — N390 Urinary tract infection, site not specified: Secondary | ICD-10-CM | POA: Diagnosis not present

## 2017-03-03 DIAGNOSIS — I69351 Hemiplegia and hemiparesis following cerebral infarction affecting right dominant side: Secondary | ICD-10-CM | POA: Diagnosis not present

## 2017-03-03 DIAGNOSIS — R41841 Cognitive communication deficit: Secondary | ICD-10-CM | POA: Diagnosis not present

## 2017-03-03 DIAGNOSIS — M6281 Muscle weakness (generalized): Secondary | ICD-10-CM | POA: Diagnosis not present

## 2017-03-03 DIAGNOSIS — R2681 Unsteadiness on feet: Secondary | ICD-10-CM | POA: Diagnosis not present

## 2017-03-03 DIAGNOSIS — R278 Other lack of coordination: Secondary | ICD-10-CM | POA: Diagnosis not present

## 2017-03-04 DIAGNOSIS — R2681 Unsteadiness on feet: Secondary | ICD-10-CM | POA: Diagnosis not present

## 2017-03-04 DIAGNOSIS — N390 Urinary tract infection, site not specified: Secondary | ICD-10-CM | POA: Diagnosis not present

## 2017-03-04 DIAGNOSIS — R41841 Cognitive communication deficit: Secondary | ICD-10-CM | POA: Diagnosis not present

## 2017-03-04 DIAGNOSIS — R278 Other lack of coordination: Secondary | ICD-10-CM | POA: Diagnosis not present

## 2017-03-04 DIAGNOSIS — I69351 Hemiplegia and hemiparesis following cerebral infarction affecting right dominant side: Secondary | ICD-10-CM | POA: Diagnosis not present

## 2017-03-04 DIAGNOSIS — M6281 Muscle weakness (generalized): Secondary | ICD-10-CM | POA: Diagnosis not present

## 2017-03-05 DIAGNOSIS — I69351 Hemiplegia and hemiparesis following cerebral infarction affecting right dominant side: Secondary | ICD-10-CM | POA: Diagnosis not present

## 2017-03-05 DIAGNOSIS — R278 Other lack of coordination: Secondary | ICD-10-CM | POA: Diagnosis not present

## 2017-03-05 DIAGNOSIS — R41841 Cognitive communication deficit: Secondary | ICD-10-CM | POA: Diagnosis not present

## 2017-03-05 DIAGNOSIS — M6281 Muscle weakness (generalized): Secondary | ICD-10-CM | POA: Diagnosis not present

## 2017-03-05 DIAGNOSIS — N390 Urinary tract infection, site not specified: Secondary | ICD-10-CM | POA: Diagnosis not present

## 2017-03-05 DIAGNOSIS — R2681 Unsteadiness on feet: Secondary | ICD-10-CM | POA: Diagnosis not present

## 2017-03-06 DIAGNOSIS — R41841 Cognitive communication deficit: Secondary | ICD-10-CM | POA: Diagnosis not present

## 2017-03-06 DIAGNOSIS — N390 Urinary tract infection, site not specified: Secondary | ICD-10-CM | POA: Diagnosis not present

## 2017-03-06 DIAGNOSIS — I69351 Hemiplegia and hemiparesis following cerebral infarction affecting right dominant side: Secondary | ICD-10-CM | POA: Diagnosis not present

## 2017-03-06 DIAGNOSIS — R2681 Unsteadiness on feet: Secondary | ICD-10-CM | POA: Diagnosis not present

## 2017-03-06 DIAGNOSIS — R278 Other lack of coordination: Secondary | ICD-10-CM | POA: Diagnosis not present

## 2017-03-06 DIAGNOSIS — M6281 Muscle weakness (generalized): Secondary | ICD-10-CM | POA: Diagnosis not present

## 2017-03-07 DIAGNOSIS — R2681 Unsteadiness on feet: Secondary | ICD-10-CM | POA: Diagnosis not present

## 2017-03-07 DIAGNOSIS — I69351 Hemiplegia and hemiparesis following cerebral infarction affecting right dominant side: Secondary | ICD-10-CM | POA: Diagnosis not present

## 2017-03-07 DIAGNOSIS — M6281 Muscle weakness (generalized): Secondary | ICD-10-CM | POA: Diagnosis not present

## 2017-03-07 DIAGNOSIS — N390 Urinary tract infection, site not specified: Secondary | ICD-10-CM | POA: Diagnosis not present

## 2017-03-07 DIAGNOSIS — R278 Other lack of coordination: Secondary | ICD-10-CM | POA: Diagnosis not present

## 2017-03-07 DIAGNOSIS — R41841 Cognitive communication deficit: Secondary | ICD-10-CM | POA: Diagnosis not present

## 2017-03-10 DIAGNOSIS — R2681 Unsteadiness on feet: Secondary | ICD-10-CM | POA: Diagnosis not present

## 2017-03-10 DIAGNOSIS — N390 Urinary tract infection, site not specified: Secondary | ICD-10-CM | POA: Diagnosis not present

## 2017-03-10 DIAGNOSIS — M6281 Muscle weakness (generalized): Secondary | ICD-10-CM | POA: Diagnosis not present

## 2017-03-10 DIAGNOSIS — I69351 Hemiplegia and hemiparesis following cerebral infarction affecting right dominant side: Secondary | ICD-10-CM | POA: Diagnosis not present

## 2017-03-10 DIAGNOSIS — R278 Other lack of coordination: Secondary | ICD-10-CM | POA: Diagnosis not present

## 2017-03-10 DIAGNOSIS — R41841 Cognitive communication deficit: Secondary | ICD-10-CM | POA: Diagnosis not present

## 2017-03-11 DIAGNOSIS — I69351 Hemiplegia and hemiparesis following cerebral infarction affecting right dominant side: Secondary | ICD-10-CM | POA: Diagnosis not present

## 2017-03-11 DIAGNOSIS — R278 Other lack of coordination: Secondary | ICD-10-CM | POA: Diagnosis not present

## 2017-03-11 DIAGNOSIS — R41841 Cognitive communication deficit: Secondary | ICD-10-CM | POA: Diagnosis not present

## 2017-03-11 DIAGNOSIS — N390 Urinary tract infection, site not specified: Secondary | ICD-10-CM | POA: Diagnosis not present

## 2017-03-11 DIAGNOSIS — M6281 Muscle weakness (generalized): Secondary | ICD-10-CM | POA: Diagnosis not present

## 2017-03-11 DIAGNOSIS — R2681 Unsteadiness on feet: Secondary | ICD-10-CM | POA: Diagnosis not present

## 2017-03-12 DIAGNOSIS — R41841 Cognitive communication deficit: Secondary | ICD-10-CM | POA: Diagnosis not present

## 2017-03-12 DIAGNOSIS — R2681 Unsteadiness on feet: Secondary | ICD-10-CM | POA: Diagnosis not present

## 2017-03-12 DIAGNOSIS — M6281 Muscle weakness (generalized): Secondary | ICD-10-CM | POA: Diagnosis not present

## 2017-03-12 DIAGNOSIS — R278 Other lack of coordination: Secondary | ICD-10-CM | POA: Diagnosis not present

## 2017-03-12 DIAGNOSIS — I69351 Hemiplegia and hemiparesis following cerebral infarction affecting right dominant side: Secondary | ICD-10-CM | POA: Diagnosis not present

## 2017-03-12 DIAGNOSIS — N390 Urinary tract infection, site not specified: Secondary | ICD-10-CM | POA: Diagnosis not present

## 2017-03-13 DIAGNOSIS — I69351 Hemiplegia and hemiparesis following cerebral infarction affecting right dominant side: Secondary | ICD-10-CM | POA: Diagnosis not present

## 2017-03-13 DIAGNOSIS — R2681 Unsteadiness on feet: Secondary | ICD-10-CM | POA: Diagnosis not present

## 2017-03-13 DIAGNOSIS — N390 Urinary tract infection, site not specified: Secondary | ICD-10-CM | POA: Diagnosis not present

## 2017-03-13 DIAGNOSIS — M6281 Muscle weakness (generalized): Secondary | ICD-10-CM | POA: Diagnosis not present

## 2017-03-13 DIAGNOSIS — R278 Other lack of coordination: Secondary | ICD-10-CM | POA: Diagnosis not present

## 2017-03-13 DIAGNOSIS — R41841 Cognitive communication deficit: Secondary | ICD-10-CM | POA: Diagnosis not present

## 2017-03-17 DIAGNOSIS — I69351 Hemiplegia and hemiparesis following cerebral infarction affecting right dominant side: Secondary | ICD-10-CM | POA: Diagnosis not present

## 2017-03-17 DIAGNOSIS — Z85850 Personal history of malignant neoplasm of thyroid: Secondary | ICD-10-CM | POA: Diagnosis not present

## 2017-03-17 DIAGNOSIS — I10 Essential (primary) hypertension: Secondary | ICD-10-CM | POA: Diagnosis not present

## 2017-03-17 DIAGNOSIS — Z791 Long term (current) use of non-steroidal anti-inflammatories (NSAID): Secondary | ICD-10-CM | POA: Diagnosis not present

## 2017-03-17 DIAGNOSIS — Z7982 Long term (current) use of aspirin: Secondary | ICD-10-CM | POA: Diagnosis not present

## 2017-03-17 DIAGNOSIS — M19012 Primary osteoarthritis, left shoulder: Secondary | ICD-10-CM | POA: Diagnosis not present

## 2017-03-17 DIAGNOSIS — I482 Chronic atrial fibrillation: Secondary | ICD-10-CM | POA: Diagnosis not present

## 2017-03-17 DIAGNOSIS — R41841 Cognitive communication deficit: Secondary | ICD-10-CM | POA: Diagnosis not present

## 2017-03-17 DIAGNOSIS — M1711 Unilateral primary osteoarthritis, right knee: Secondary | ICD-10-CM | POA: Diagnosis not present

## 2017-03-17 DIAGNOSIS — E890 Postprocedural hypothyroidism: Secondary | ICD-10-CM | POA: Diagnosis not present

## 2017-03-17 DIAGNOSIS — Z86011 Personal history of benign neoplasm of the brain: Secondary | ICD-10-CM | POA: Diagnosis not present

## 2017-03-17 DIAGNOSIS — H409 Unspecified glaucoma: Secondary | ICD-10-CM | POA: Diagnosis not present

## 2017-03-19 DIAGNOSIS — I482 Chronic atrial fibrillation: Secondary | ICD-10-CM | POA: Diagnosis not present

## 2017-03-19 DIAGNOSIS — I69351 Hemiplegia and hemiparesis following cerebral infarction affecting right dominant side: Secondary | ICD-10-CM | POA: Diagnosis not present

## 2017-03-19 DIAGNOSIS — I10 Essential (primary) hypertension: Secondary | ICD-10-CM | POA: Diagnosis not present

## 2017-03-19 DIAGNOSIS — R41841 Cognitive communication deficit: Secondary | ICD-10-CM | POA: Diagnosis not present

## 2017-03-19 DIAGNOSIS — M19012 Primary osteoarthritis, left shoulder: Secondary | ICD-10-CM | POA: Diagnosis not present

## 2017-03-19 DIAGNOSIS — M1711 Unilateral primary osteoarthritis, right knee: Secondary | ICD-10-CM | POA: Diagnosis not present

## 2017-03-20 ENCOUNTER — Encounter (INDEPENDENT_AMBULATORY_CARE_PROVIDER_SITE_OTHER): Payer: Medicare Other | Admitting: Ophthalmology

## 2017-03-20 DIAGNOSIS — I10 Essential (primary) hypertension: Secondary | ICD-10-CM | POA: Diagnosis not present

## 2017-03-20 DIAGNOSIS — I69351 Hemiplegia and hemiparesis following cerebral infarction affecting right dominant side: Secondary | ICD-10-CM | POA: Diagnosis not present

## 2017-03-20 DIAGNOSIS — M1711 Unilateral primary osteoarthritis, right knee: Secondary | ICD-10-CM | POA: Diagnosis not present

## 2017-03-20 DIAGNOSIS — R41841 Cognitive communication deficit: Secondary | ICD-10-CM | POA: Diagnosis not present

## 2017-03-20 DIAGNOSIS — M19012 Primary osteoarthritis, left shoulder: Secondary | ICD-10-CM | POA: Diagnosis not present

## 2017-03-20 DIAGNOSIS — I482 Chronic atrial fibrillation: Secondary | ICD-10-CM | POA: Diagnosis not present

## 2017-03-21 DIAGNOSIS — I482 Chronic atrial fibrillation: Secondary | ICD-10-CM | POA: Diagnosis not present

## 2017-03-21 DIAGNOSIS — I10 Essential (primary) hypertension: Secondary | ICD-10-CM | POA: Diagnosis not present

## 2017-03-21 DIAGNOSIS — M19012 Primary osteoarthritis, left shoulder: Secondary | ICD-10-CM | POA: Diagnosis not present

## 2017-03-21 DIAGNOSIS — R41841 Cognitive communication deficit: Secondary | ICD-10-CM | POA: Diagnosis not present

## 2017-03-21 DIAGNOSIS — I69351 Hemiplegia and hemiparesis following cerebral infarction affecting right dominant side: Secondary | ICD-10-CM | POA: Diagnosis not present

## 2017-03-21 DIAGNOSIS — M1711 Unilateral primary osteoarthritis, right knee: Secondary | ICD-10-CM | POA: Diagnosis not present

## 2017-03-23 DIAGNOSIS — M19012 Primary osteoarthritis, left shoulder: Secondary | ICD-10-CM | POA: Diagnosis not present

## 2017-03-23 DIAGNOSIS — I69351 Hemiplegia and hemiparesis following cerebral infarction affecting right dominant side: Secondary | ICD-10-CM | POA: Diagnosis not present

## 2017-03-23 DIAGNOSIS — R41841 Cognitive communication deficit: Secondary | ICD-10-CM | POA: Diagnosis not present

## 2017-03-23 DIAGNOSIS — M1711 Unilateral primary osteoarthritis, right knee: Secondary | ICD-10-CM | POA: Diagnosis not present

## 2017-03-23 DIAGNOSIS — I482 Chronic atrial fibrillation: Secondary | ICD-10-CM | POA: Diagnosis not present

## 2017-03-23 DIAGNOSIS — I10 Essential (primary) hypertension: Secondary | ICD-10-CM | POA: Diagnosis not present

## 2017-03-23 IMAGING — CT CT RENAL STONE PROTOCOL
2 of 4 series · 9 of 46 positions shown, 10 images · non-contrast
Comparison: Prior CT from 11/01/2015.

CLINICAL DATA: Initial evaluation for acute left flank pain.

EXAM:
CT ABDOMEN AND PELVIS WITHOUT CONTRAST
TECHNIQUE: Multidetector CT imaging of the abdomen and pelvis was performed
following the standard protocol without IV contrast.

[Series 201: stone study, idose (2) · axial · 0.79mm/px · z∈[+103,+523]mm · 6 of 102 slices shown, 7 images]
[im 9/102  soft-tissue]
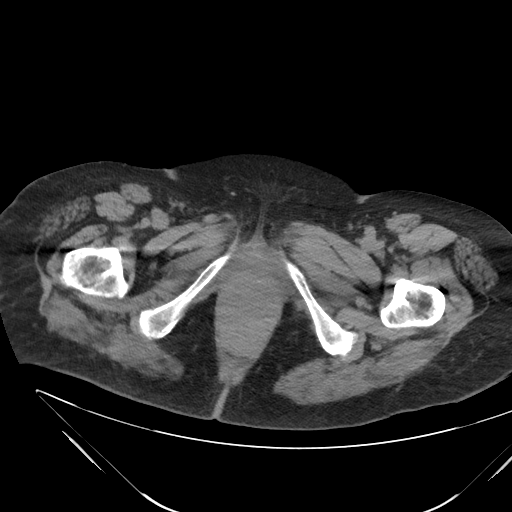
[im 9/102  bone]
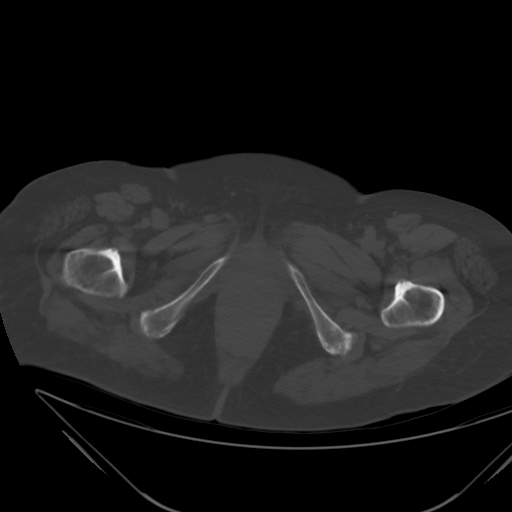
[im 27/102  soft-tissue]
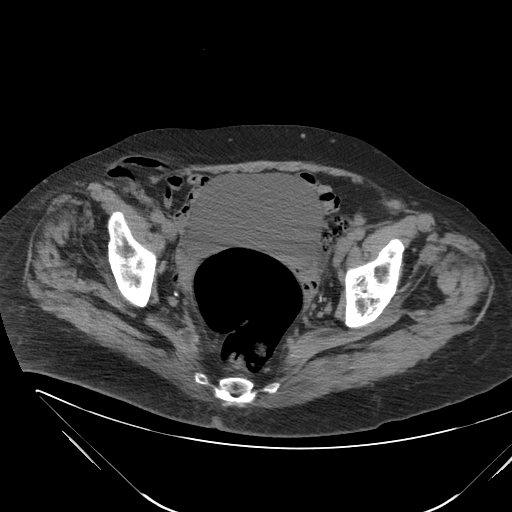
[im 44/102  soft-tissue]
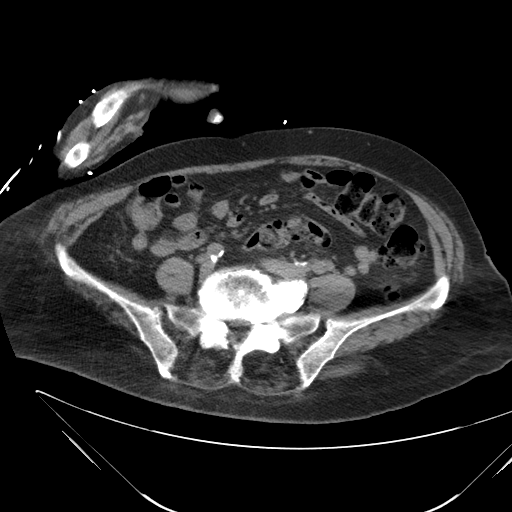
[im 58/102  soft-tissue]
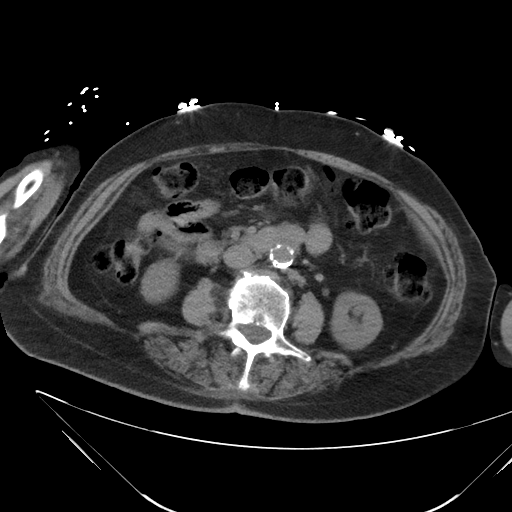
[im 75/102  soft-tissue]
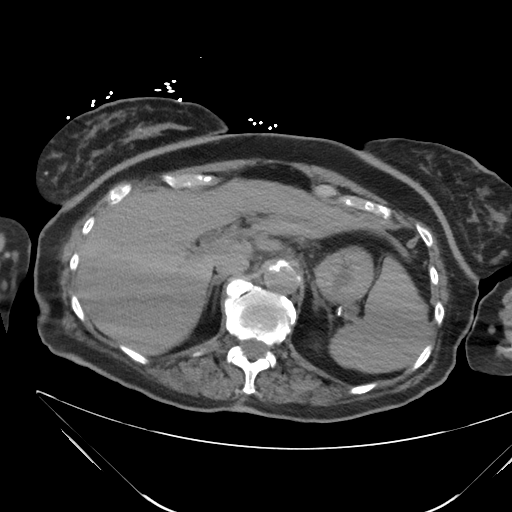
[im 93/102  soft-tissue]
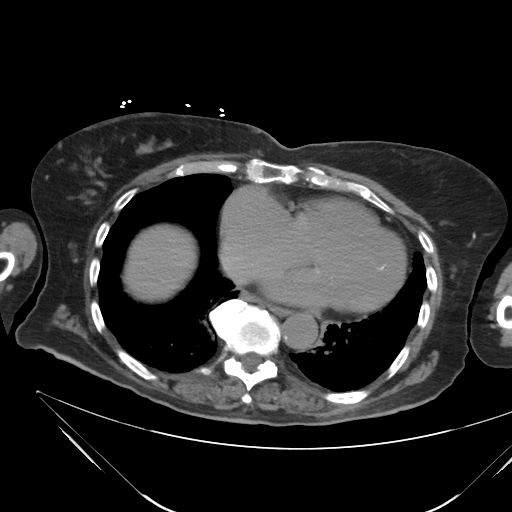

[Series 203: coronals, idose (2) · coronal · 0.45mm/px · 3 of 148 slices shown]
[im 50/148  soft-tissue]
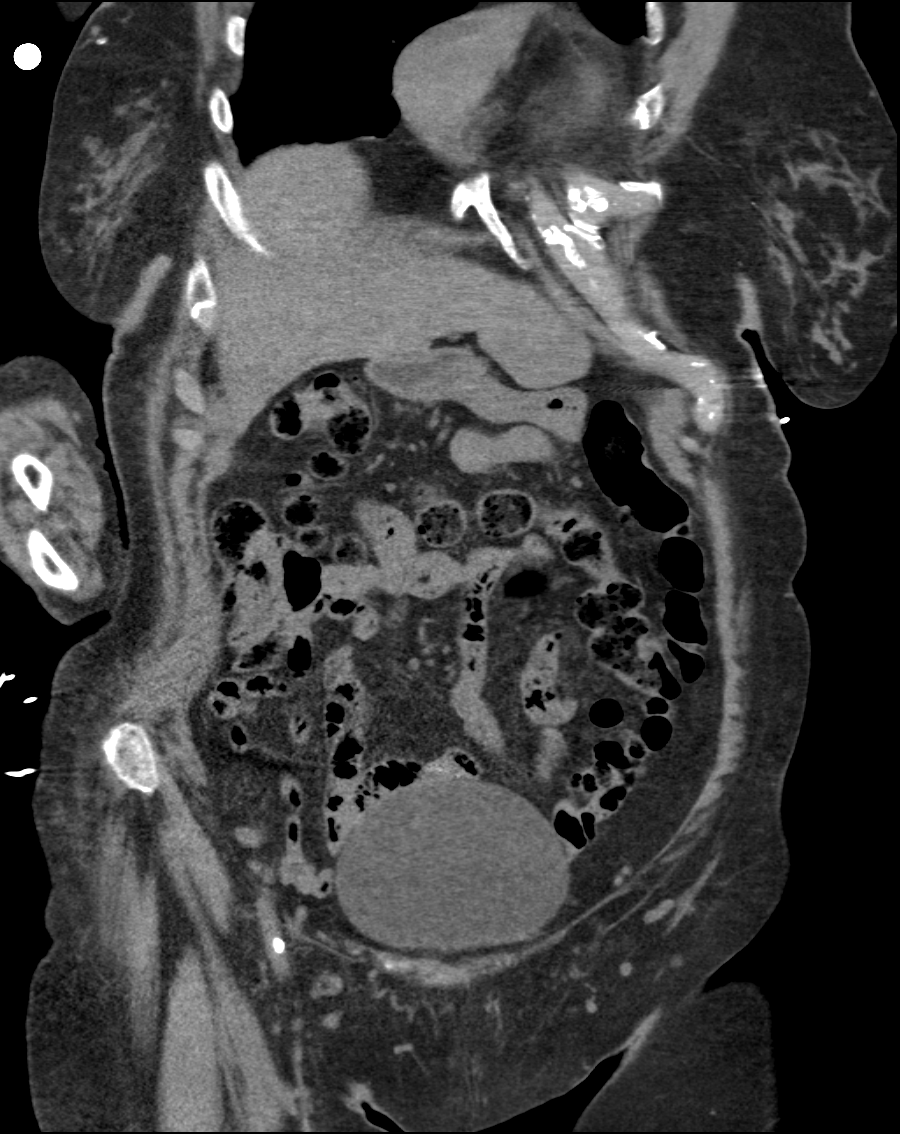
[im 66/148  soft-tissue]
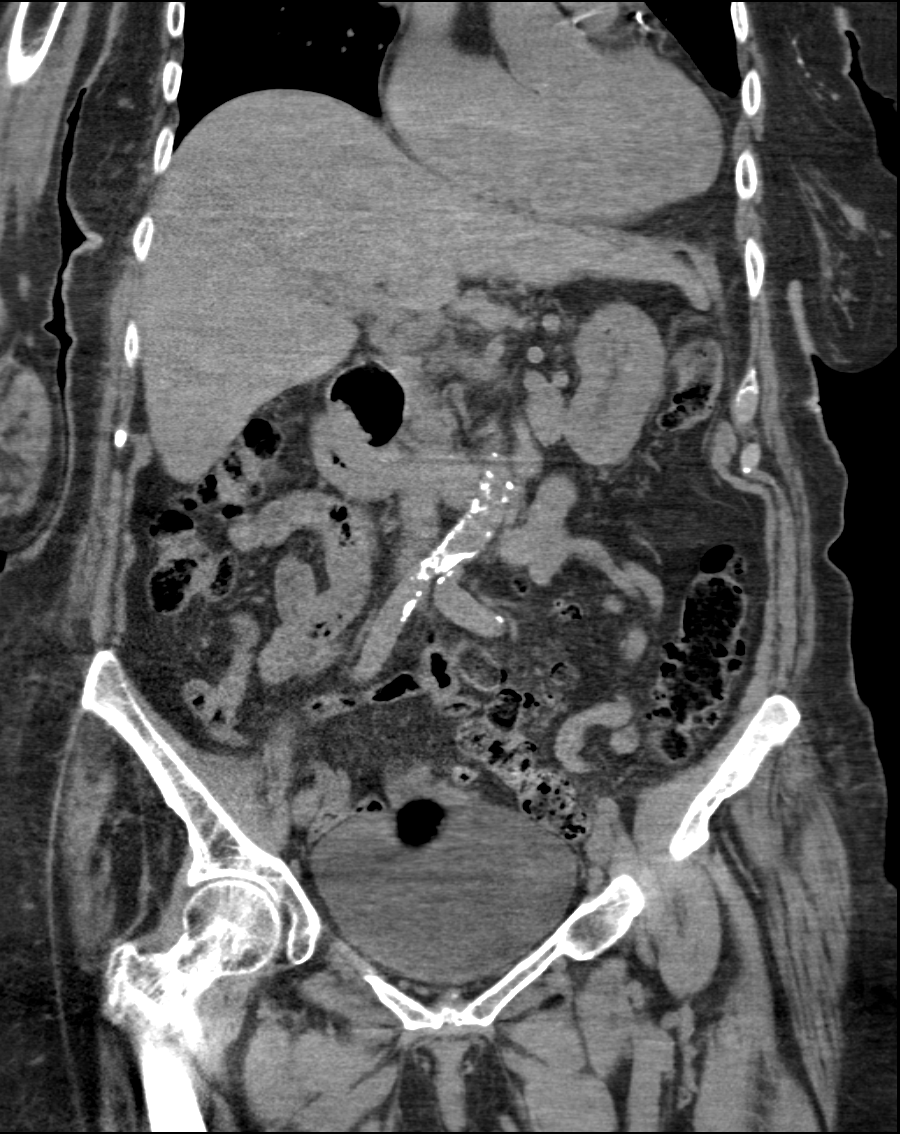
[im 82/148  soft-tissue]
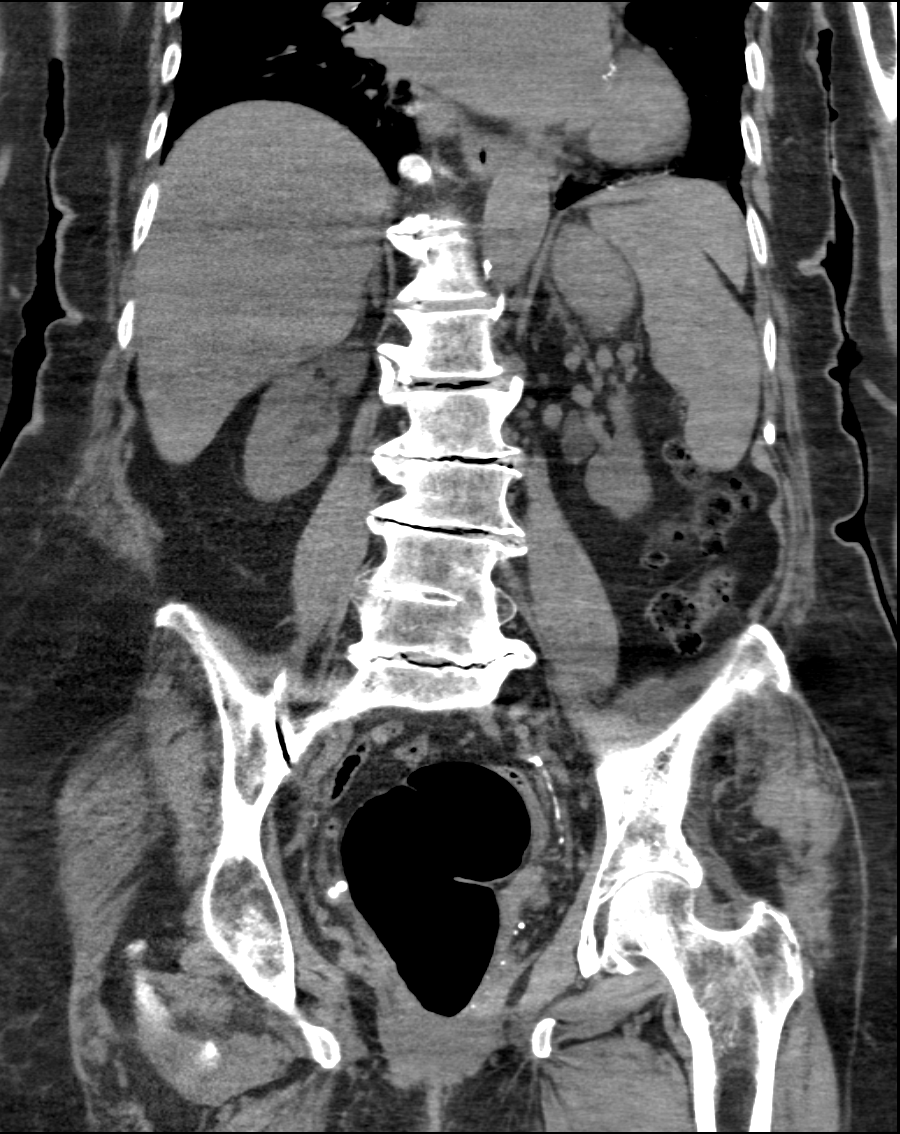

[9 of 46 positions shown; findings below may reference images not displayed]

FINDINGS: Scattered bibasilar atelectasis/scarring present within the
visualized lung bases. Visualized lung bases are otherwise clear.
The cardiomegaly partially visualized. Scattered coronary artery
calcifications with aortic and mitral valvular calcifications. No
pleural or pericardial fusion.

Limited noncontrast evaluation of the liver is unremarkable.
Gallbladder is absent. No biliary dilatation. Spleen within normal
limits on this noncontrast examination. Known splenic lesions not
well seen. Adrenal glands and pancreas within normal limits.

The mild left-sided hydronephrosis present. No radiopaque calculi
seen within the left kidney. No radiopaque stones seen along the
course of the left ureter. Finding raises the possibility for AE
recently passed stone. Possible infection could also be considered.

Extrarenal pelvis present on the right without frank hydronephrosis.
No right-sided renal calculi. No radiopaque calculi seen along the
course of the right ureter. There is no hydroureter.

Stomach within normal limits. No evidence for bowel obstruction. No
abnormal wall thickening or inflammatory fat stranding seen about
the bowels. Colonic diverticulosis without evidence for acute
diverticulitis.

Bladder well distended. No layering stones within the lateral lumen.
Small focus of gas within the bladder lumen may be related to recent
intervention. The

Uterus is absent.  Ovaries not discretely identified.

Fat and bowel containing right inguinal hernia noted without
associated obstruction.

No free air or fluid. No adenopathy. Moderate aorto bi-iliac
atherosclerotic disease. No aneurysm.

No acute osseous abnormality. No worrisome lytic or blastic osseous
lesions.
IMPRESSION: 1. Mild left-sided hydronephrosis with no obstructive radiopaque
stone identified. Finding raises the possibility for a recently
passed stone. Possible infection could also be considered.
Correlation with urinalysis recommended. No other radiopaque calculi
identified.
2. No other acute intra-abdominal or pelvic process.
3. Colonic diverticulosis without evidence for acute diverticulitis.
4. Small focus of gas within the bladder lumen. Question recent
intervention/catheterization.
5. Fat and small bowel containing right inguinal hernia without
associated obstruction or inflammation.

## 2017-03-24 DIAGNOSIS — M1711 Unilateral primary osteoarthritis, right knee: Secondary | ICD-10-CM | POA: Diagnosis not present

## 2017-03-24 DIAGNOSIS — M19012 Primary osteoarthritis, left shoulder: Secondary | ICD-10-CM | POA: Diagnosis not present

## 2017-03-24 DIAGNOSIS — I482 Chronic atrial fibrillation: Secondary | ICD-10-CM | POA: Diagnosis not present

## 2017-03-24 DIAGNOSIS — I10 Essential (primary) hypertension: Secondary | ICD-10-CM | POA: Diagnosis not present

## 2017-03-24 DIAGNOSIS — I69351 Hemiplegia and hemiparesis following cerebral infarction affecting right dominant side: Secondary | ICD-10-CM | POA: Diagnosis not present

## 2017-03-24 DIAGNOSIS — R41841 Cognitive communication deficit: Secondary | ICD-10-CM | POA: Diagnosis not present

## 2017-03-25 DIAGNOSIS — S0003XA Contusion of scalp, initial encounter: Secondary | ICD-10-CM | POA: Diagnosis not present

## 2017-03-25 DIAGNOSIS — S3993XA Unspecified injury of pelvis, initial encounter: Secondary | ICD-10-CM | POA: Diagnosis not present

## 2017-03-25 DIAGNOSIS — N3000 Acute cystitis without hematuria: Secondary | ICD-10-CM | POA: Diagnosis not present

## 2017-03-25 DIAGNOSIS — S3992XA Unspecified injury of lower back, initial encounter: Secondary | ICD-10-CM | POA: Diagnosis not present

## 2017-03-25 DIAGNOSIS — I69954 Hemiplegia and hemiparesis following unspecified cerebrovascular disease affecting left non-dominant side: Secondary | ICD-10-CM | POA: Diagnosis not present

## 2017-03-25 DIAGNOSIS — M549 Dorsalgia, unspecified: Secondary | ICD-10-CM | POA: Diagnosis not present

## 2017-03-25 DIAGNOSIS — S29002A Unspecified injury of muscle and tendon of back wall of thorax, initial encounter: Secondary | ICD-10-CM | POA: Diagnosis not present

## 2017-03-26 DIAGNOSIS — M19012 Primary osteoarthritis, left shoulder: Secondary | ICD-10-CM | POA: Diagnosis not present

## 2017-03-26 DIAGNOSIS — I482 Chronic atrial fibrillation: Secondary | ICD-10-CM | POA: Diagnosis not present

## 2017-03-26 DIAGNOSIS — I69351 Hemiplegia and hemiparesis following cerebral infarction affecting right dominant side: Secondary | ICD-10-CM | POA: Diagnosis not present

## 2017-03-26 DIAGNOSIS — M1711 Unilateral primary osteoarthritis, right knee: Secondary | ICD-10-CM | POA: Diagnosis not present

## 2017-03-26 DIAGNOSIS — I10 Essential (primary) hypertension: Secondary | ICD-10-CM | POA: Diagnosis not present

## 2017-03-26 DIAGNOSIS — R41841 Cognitive communication deficit: Secondary | ICD-10-CM | POA: Diagnosis not present

## 2017-03-27 DIAGNOSIS — M1711 Unilateral primary osteoarthritis, right knee: Secondary | ICD-10-CM | POA: Diagnosis not present

## 2017-03-27 DIAGNOSIS — I10 Essential (primary) hypertension: Secondary | ICD-10-CM | POA: Diagnosis not present

## 2017-03-27 DIAGNOSIS — R41841 Cognitive communication deficit: Secondary | ICD-10-CM | POA: Diagnosis not present

## 2017-03-27 DIAGNOSIS — I69351 Hemiplegia and hemiparesis following cerebral infarction affecting right dominant side: Secondary | ICD-10-CM | POA: Diagnosis not present

## 2017-03-27 DIAGNOSIS — I482 Chronic atrial fibrillation: Secondary | ICD-10-CM | POA: Diagnosis not present

## 2017-03-27 DIAGNOSIS — M19012 Primary osteoarthritis, left shoulder: Secondary | ICD-10-CM | POA: Diagnosis not present

## 2017-03-31 ENCOUNTER — Ambulatory Visit (INDEPENDENT_AMBULATORY_CARE_PROVIDER_SITE_OTHER): Payer: Medicare Other | Admitting: Ophthalmology

## 2017-03-31 DIAGNOSIS — I69351 Hemiplegia and hemiparesis following cerebral infarction affecting right dominant side: Secondary | ICD-10-CM | POA: Diagnosis not present

## 2017-03-31 DIAGNOSIS — I10 Essential (primary) hypertension: Secondary | ICD-10-CM | POA: Diagnosis not present

## 2017-03-31 DIAGNOSIS — M1711 Unilateral primary osteoarthritis, right knee: Secondary | ICD-10-CM | POA: Diagnosis not present

## 2017-03-31 DIAGNOSIS — I482 Chronic atrial fibrillation: Secondary | ICD-10-CM | POA: Diagnosis not present

## 2017-03-31 DIAGNOSIS — R41841 Cognitive communication deficit: Secondary | ICD-10-CM | POA: Diagnosis not present

## 2017-03-31 DIAGNOSIS — M19012 Primary osteoarthritis, left shoulder: Secondary | ICD-10-CM | POA: Diagnosis not present

## 2017-04-01 DIAGNOSIS — M1711 Unilateral primary osteoarthritis, right knee: Secondary | ICD-10-CM | POA: Diagnosis not present

## 2017-04-01 DIAGNOSIS — I482 Chronic atrial fibrillation: Secondary | ICD-10-CM | POA: Diagnosis not present

## 2017-04-01 DIAGNOSIS — M19012 Primary osteoarthritis, left shoulder: Secondary | ICD-10-CM | POA: Diagnosis not present

## 2017-04-01 DIAGNOSIS — I69351 Hemiplegia and hemiparesis following cerebral infarction affecting right dominant side: Secondary | ICD-10-CM | POA: Diagnosis not present

## 2017-04-01 DIAGNOSIS — I10 Essential (primary) hypertension: Secondary | ICD-10-CM | POA: Diagnosis not present

## 2017-04-01 DIAGNOSIS — R41841 Cognitive communication deficit: Secondary | ICD-10-CM | POA: Diagnosis not present

## 2017-04-02 DIAGNOSIS — I10 Essential (primary) hypertension: Secondary | ICD-10-CM | POA: Diagnosis not present

## 2017-04-02 DIAGNOSIS — R41841 Cognitive communication deficit: Secondary | ICD-10-CM | POA: Diagnosis not present

## 2017-04-02 DIAGNOSIS — M19012 Primary osteoarthritis, left shoulder: Secondary | ICD-10-CM | POA: Diagnosis not present

## 2017-04-02 DIAGNOSIS — I482 Chronic atrial fibrillation: Secondary | ICD-10-CM | POA: Diagnosis not present

## 2017-04-02 DIAGNOSIS — I69351 Hemiplegia and hemiparesis following cerebral infarction affecting right dominant side: Secondary | ICD-10-CM | POA: Diagnosis not present

## 2017-04-02 DIAGNOSIS — M1711 Unilateral primary osteoarthritis, right knee: Secondary | ICD-10-CM | POA: Diagnosis not present

## 2017-04-03 DIAGNOSIS — M19012 Primary osteoarthritis, left shoulder: Secondary | ICD-10-CM | POA: Diagnosis not present

## 2017-04-03 DIAGNOSIS — I482 Chronic atrial fibrillation: Secondary | ICD-10-CM | POA: Diagnosis not present

## 2017-04-03 DIAGNOSIS — I10 Essential (primary) hypertension: Secondary | ICD-10-CM | POA: Diagnosis not present

## 2017-04-03 DIAGNOSIS — I69351 Hemiplegia and hemiparesis following cerebral infarction affecting right dominant side: Secondary | ICD-10-CM | POA: Diagnosis not present

## 2017-04-03 DIAGNOSIS — R41841 Cognitive communication deficit: Secondary | ICD-10-CM | POA: Diagnosis not present

## 2017-04-03 DIAGNOSIS — M1711 Unilateral primary osteoarthritis, right knee: Secondary | ICD-10-CM | POA: Diagnosis not present

## 2017-04-06 DIAGNOSIS — I10 Essential (primary) hypertension: Secondary | ICD-10-CM | POA: Diagnosis not present

## 2017-04-06 DIAGNOSIS — M19012 Primary osteoarthritis, left shoulder: Secondary | ICD-10-CM | POA: Diagnosis not present

## 2017-04-06 DIAGNOSIS — M1711 Unilateral primary osteoarthritis, right knee: Secondary | ICD-10-CM | POA: Diagnosis not present

## 2017-04-06 DIAGNOSIS — R41841 Cognitive communication deficit: Secondary | ICD-10-CM | POA: Diagnosis not present

## 2017-04-06 DIAGNOSIS — I482 Chronic atrial fibrillation: Secondary | ICD-10-CM | POA: Diagnosis not present

## 2017-04-06 DIAGNOSIS — I69351 Hemiplegia and hemiparesis following cerebral infarction affecting right dominant side: Secondary | ICD-10-CM | POA: Diagnosis not present

## 2017-04-07 DIAGNOSIS — I69351 Hemiplegia and hemiparesis following cerebral infarction affecting right dominant side: Secondary | ICD-10-CM | POA: Diagnosis not present

## 2017-04-07 DIAGNOSIS — M19012 Primary osteoarthritis, left shoulder: Secondary | ICD-10-CM | POA: Diagnosis not present

## 2017-04-07 DIAGNOSIS — R41841 Cognitive communication deficit: Secondary | ICD-10-CM | POA: Diagnosis not present

## 2017-04-07 DIAGNOSIS — I10 Essential (primary) hypertension: Secondary | ICD-10-CM | POA: Diagnosis not present

## 2017-04-07 DIAGNOSIS — M1711 Unilateral primary osteoarthritis, right knee: Secondary | ICD-10-CM | POA: Diagnosis not present

## 2017-04-07 DIAGNOSIS — I482 Chronic atrial fibrillation: Secondary | ICD-10-CM | POA: Diagnosis not present

## 2017-04-08 DIAGNOSIS — I10 Essential (primary) hypertension: Secondary | ICD-10-CM | POA: Diagnosis not present

## 2017-04-08 DIAGNOSIS — I69351 Hemiplegia and hemiparesis following cerebral infarction affecting right dominant side: Secondary | ICD-10-CM | POA: Diagnosis not present

## 2017-04-08 DIAGNOSIS — M19012 Primary osteoarthritis, left shoulder: Secondary | ICD-10-CM | POA: Diagnosis not present

## 2017-04-08 DIAGNOSIS — M1711 Unilateral primary osteoarthritis, right knee: Secondary | ICD-10-CM | POA: Diagnosis not present

## 2017-04-08 DIAGNOSIS — R41841 Cognitive communication deficit: Secondary | ICD-10-CM | POA: Diagnosis not present

## 2017-04-08 DIAGNOSIS — I482 Chronic atrial fibrillation: Secondary | ICD-10-CM | POA: Diagnosis not present

## 2017-04-09 ENCOUNTER — Encounter (INDEPENDENT_AMBULATORY_CARE_PROVIDER_SITE_OTHER): Payer: Medicare Other | Admitting: Ophthalmology

## 2017-04-09 DIAGNOSIS — I69351 Hemiplegia and hemiparesis following cerebral infarction affecting right dominant side: Secondary | ICD-10-CM | POA: Diagnosis not present

## 2017-04-09 DIAGNOSIS — M1711 Unilateral primary osteoarthritis, right knee: Secondary | ICD-10-CM | POA: Diagnosis not present

## 2017-04-09 DIAGNOSIS — M19012 Primary osteoarthritis, left shoulder: Secondary | ICD-10-CM | POA: Diagnosis not present

## 2017-04-09 DIAGNOSIS — I482 Chronic atrial fibrillation: Secondary | ICD-10-CM | POA: Diagnosis not present

## 2017-04-09 DIAGNOSIS — R41841 Cognitive communication deficit: Secondary | ICD-10-CM | POA: Diagnosis not present

## 2017-04-09 DIAGNOSIS — I10 Essential (primary) hypertension: Secondary | ICD-10-CM | POA: Diagnosis not present

## 2017-04-10 DIAGNOSIS — M1711 Unilateral primary osteoarthritis, right knee: Secondary | ICD-10-CM | POA: Diagnosis not present

## 2017-04-10 DIAGNOSIS — I10 Essential (primary) hypertension: Secondary | ICD-10-CM | POA: Diagnosis not present

## 2017-04-10 DIAGNOSIS — I482 Chronic atrial fibrillation: Secondary | ICD-10-CM | POA: Diagnosis not present

## 2017-04-10 DIAGNOSIS — R41841 Cognitive communication deficit: Secondary | ICD-10-CM | POA: Diagnosis not present

## 2017-04-10 DIAGNOSIS — M19012 Primary osteoarthritis, left shoulder: Secondary | ICD-10-CM | POA: Diagnosis not present

## 2017-04-10 DIAGNOSIS — I69351 Hemiplegia and hemiparesis following cerebral infarction affecting right dominant side: Secondary | ICD-10-CM | POA: Diagnosis not present

## 2017-04-14 DIAGNOSIS — M19012 Primary osteoarthritis, left shoulder: Secondary | ICD-10-CM | POA: Diagnosis not present

## 2017-04-14 DIAGNOSIS — R41841 Cognitive communication deficit: Secondary | ICD-10-CM | POA: Diagnosis not present

## 2017-04-14 DIAGNOSIS — I10 Essential (primary) hypertension: Secondary | ICD-10-CM | POA: Diagnosis not present

## 2017-04-14 DIAGNOSIS — I482 Chronic atrial fibrillation: Secondary | ICD-10-CM | POA: Diagnosis not present

## 2017-04-14 DIAGNOSIS — M1711 Unilateral primary osteoarthritis, right knee: Secondary | ICD-10-CM | POA: Diagnosis not present

## 2017-04-14 DIAGNOSIS — I69351 Hemiplegia and hemiparesis following cerebral infarction affecting right dominant side: Secondary | ICD-10-CM | POA: Diagnosis not present

## 2017-04-21 ENCOUNTER — Encounter (INDEPENDENT_AMBULATORY_CARE_PROVIDER_SITE_OTHER): Payer: Medicare Other | Admitting: Ophthalmology

## 2017-04-21 DIAGNOSIS — H35033 Hypertensive retinopathy, bilateral: Secondary | ICD-10-CM | POA: Diagnosis not present

## 2017-04-21 DIAGNOSIS — H43812 Vitreous degeneration, left eye: Secondary | ICD-10-CM

## 2017-04-21 DIAGNOSIS — I10 Essential (primary) hypertension: Secondary | ICD-10-CM | POA: Diagnosis not present

## 2017-04-21 DIAGNOSIS — H35371 Puckering of macula, right eye: Secondary | ICD-10-CM | POA: Diagnosis not present

## 2017-04-21 DIAGNOSIS — H59031 Cystoid macular edema following cataract surgery, right eye: Secondary | ICD-10-CM

## 2017-04-28 ENCOUNTER — Telehealth: Payer: Self-pay | Admitting: *Deleted

## 2017-04-28 DIAGNOSIS — R609 Edema, unspecified: Secondary | ICD-10-CM

## 2017-04-28 NOTE — Telephone Encounter (Addendum)
-----   Message from Trula Slade, DPM sent at 04/28/2017  8:07 AM EDT ----- I saw her several months ago and we did a venous duplex with showed a chronic DVT. We had put in a referral for vascular but looks like she went in the hospital and didn't get to follow-up. Can you follow-up with her please and get her rescheduled? Thanks. 04/28/2017-Left message informing pt or necessity for referral to Cardiovascular and to call for information. Pt's dtr, Olin Hauser called concerning the cardiovascular referral message.04/30/2017-I spoke with pt's dtr, Julious Payer and explained Dr. Jacqualyn Posey had wanted to have pt see Dr. Fletcher Anon to make sure all was being done to make certain her venous flow was as good as it could be. I was speaking with Olin Hauser and the phone went dead. I call Olin Hauser again and could hear people speaking in the background and then the phone hung up again. I called the same number again and left message that I could fax pt's referral and results if they would like. I spoke with Drue Flirt Stachowski's office, she states Olin Hauser requested the results and the referral be faxed to [phone].Faxed referral and venous results to [facility]. Faxed referral, clinicals and demographics to [place].

## 2017-05-07 DIAGNOSIS — M1990 Unspecified osteoarthritis, unspecified site: Secondary | ICD-10-CM | POA: Diagnosis not present

## 2017-05-07 DIAGNOSIS — Z6827 Body mass index (BMI) 27.0-27.9, adult: Secondary | ICD-10-CM | POA: Diagnosis not present

## 2017-05-07 DIAGNOSIS — I10 Essential (primary) hypertension: Secondary | ICD-10-CM | POA: Diagnosis not present

## 2017-05-07 DIAGNOSIS — E784 Other hyperlipidemia: Secondary | ICD-10-CM | POA: Diagnosis not present

## 2017-05-07 DIAGNOSIS — Z1389 Encounter for screening for other disorder: Secondary | ICD-10-CM | POA: Diagnosis not present

## 2017-05-07 DIAGNOSIS — I129 Hypertensive chronic kidney disease with stage 1 through stage 4 chronic kidney disease, or unspecified chronic kidney disease: Secondary | ICD-10-CM | POA: Diagnosis not present

## 2017-05-07 DIAGNOSIS — E038 Other specified hypothyroidism: Secondary | ICD-10-CM | POA: Diagnosis not present

## 2017-05-07 DIAGNOSIS — I480 Paroxysmal atrial fibrillation: Secondary | ICD-10-CM | POA: Diagnosis not present

## 2017-05-07 DIAGNOSIS — N183 Chronic kidney disease, stage 3 (moderate): Secondary | ICD-10-CM | POA: Diagnosis not present

## 2017-05-07 DIAGNOSIS — I69959 Hemiplegia and hemiparesis following unspecified cerebrovascular disease affecting unspecified side: Secondary | ICD-10-CM | POA: Diagnosis not present

## 2017-05-07 DIAGNOSIS — R590 Localized enlarged lymph nodes: Secondary | ICD-10-CM | POA: Diagnosis not present

## 2017-05-07 DIAGNOSIS — Z872 Personal history of diseases of the skin and subcutaneous tissue: Secondary | ICD-10-CM | POA: Diagnosis not present

## 2017-05-23 DIAGNOSIS — E038 Other specified hypothyroidism: Secondary | ICD-10-CM | POA: Diagnosis not present

## 2017-05-23 DIAGNOSIS — E119 Type 2 diabetes mellitus without complications: Secondary | ICD-10-CM | POA: Diagnosis not present

## 2017-05-23 DIAGNOSIS — E782 Mixed hyperlipidemia: Secondary | ICD-10-CM | POA: Diagnosis not present

## 2017-05-23 DIAGNOSIS — D518 Other vitamin B12 deficiency anemias: Secondary | ICD-10-CM | POA: Diagnosis not present

## 2017-05-23 DIAGNOSIS — E559 Vitamin D deficiency, unspecified: Secondary | ICD-10-CM | POA: Diagnosis not present

## 2017-05-23 DIAGNOSIS — Z79899 Other long term (current) drug therapy: Secondary | ICD-10-CM | POA: Diagnosis not present

## 2017-06-11 DIAGNOSIS — S0181XA Laceration without foreign body of other part of head, initial encounter: Secondary | ICD-10-CM | POA: Diagnosis not present

## 2017-06-11 DIAGNOSIS — S0990XA Unspecified injury of head, initial encounter: Secondary | ICD-10-CM | POA: Diagnosis not present

## 2017-06-11 DIAGNOSIS — F0390 Unspecified dementia without behavioral disturbance: Secondary | ICD-10-CM | POA: Diagnosis not present

## 2017-06-11 DIAGNOSIS — G8911 Acute pain due to trauma: Secondary | ICD-10-CM | POA: Diagnosis not present

## 2017-06-25 DIAGNOSIS — S0191XD Laceration without foreign body of unspecified part of head, subsequent encounter: Secondary | ICD-10-CM | POA: Diagnosis not present

## 2017-06-25 DIAGNOSIS — G8191 Hemiplegia, unspecified affecting right dominant side: Secondary | ICD-10-CM | POA: Diagnosis not present

## 2017-06-25 DIAGNOSIS — R296 Repeated falls: Secondary | ICD-10-CM | POA: Diagnosis not present

## 2017-07-01 DIAGNOSIS — R296 Repeated falls: Secondary | ICD-10-CM | POA: Diagnosis not present

## 2017-07-01 DIAGNOSIS — S0191XD Laceration without foreign body of unspecified part of head, subsequent encounter: Secondary | ICD-10-CM | POA: Diagnosis not present

## 2017-07-02 DIAGNOSIS — I739 Peripheral vascular disease, unspecified: Secondary | ICD-10-CM | POA: Diagnosis not present

## 2017-07-02 DIAGNOSIS — R0989 Other specified symptoms and signs involving the circulatory and respiratory systems: Secondary | ICD-10-CM | POA: Diagnosis not present

## 2017-07-03 DIAGNOSIS — R221 Localized swelling, mass and lump, neck: Secondary | ICD-10-CM | POA: Diagnosis not present

## 2017-07-03 DIAGNOSIS — M79609 Pain in unspecified limb: Secondary | ICD-10-CM | POA: Diagnosis not present

## 2017-07-04 ENCOUNTER — Telehealth: Payer: Self-pay | Admitting: Cardiovascular Disease

## 2017-07-04 ENCOUNTER — Telehealth: Payer: Self-pay | Admitting: Podiatry

## 2017-07-04 NOTE — Telephone Encounter (Signed)
Received incoming records from [place] and [facility] for upcoming appointment on 07/22/17 @ 10am with Dr. Fletcher Anon. Records given to [facility] in Medical Records. 07/04/17 ab

## 2017-07-04 NOTE — Telephone Encounter (Signed)
I informed Jillian Cantrell that we had not seen pt since 01/2017 and had not ordered test, but I did see she had and appt with DR. Arida 07/22/2017 to evaluate a chronic DVT of the left leg.

## 2017-07-04 NOTE — Telephone Encounter (Signed)
Pt's daughter in law Lynne Takemoto called wanting to know why pt had to have these test's done again when she already had them done at [facility] on May 03 and 04. States those tests came back clear and that the pt's leg has been swelling ever since her stroke. Requested a call back at [phone].

## 2017-07-22 ENCOUNTER — Encounter: Payer: Self-pay | Admitting: Cardiovascular Disease

## 2017-07-22 ENCOUNTER — Ambulatory Visit (INDEPENDENT_AMBULATORY_CARE_PROVIDER_SITE_OTHER): Payer: Medicare Other | Admitting: Cardiovascular Disease

## 2017-07-22 VITALS — BP 102/70 | HR 82 | Ht 67.0 in | Wt 176.0 lb

## 2017-07-22 DIAGNOSIS — I482 Chronic atrial fibrillation, unspecified: Secondary | ICD-10-CM

## 2017-07-22 DIAGNOSIS — I872 Venous insufficiency (chronic) (peripheral): Secondary | ICD-10-CM

## 2017-07-22 MED ORDER — APIXABAN 5 MG PO TABS: 5.0000 mg | ORAL_TABLET | Freq: Two times a day (BID) | ORAL | 11 refills | Status: DC

## 2017-07-22 NOTE — Progress Notes (Signed)
Cardiology Office Note   Date:  07/22/2017   ID:  Jillian Cantrell, DOB Sep 18, 1927, MRN [id number]  PCP:  Burnard Bunting, MD  Cardiologist: Dr. Percival Spanish  Chief Complaint  Patient presents with  . Follow-up    NP. Chronic DVT of left popiliteal vein.  . Edema    Ankles and feet.      History of Present Illness: Jillian Cantrell is a 83 y.o. female who Was referred by Dr. Earleen Newport for evaluation of leg swelling and chronic left popliteal vein DVT. She has known history of atrial fibrillation not on anticoagulation due to GI bleed. She had prior stroke with right-sided weakness. She had GI bleed in January 2017 without obvious source. She was seen by Dr. Percival Spanish most recently in May 2017. At that time anticoagulation was considered and the patient was supposed to follow-up in 6 months but that did not happen. She currently lives in assisted living facility. She is able to walk with a cane with no recent falls. She describes bilateral leg swelling worse on the right side. She had lower extremity arterial Doppler which showed normal ABI. Venous duplex showed chronic left popliteal vein thrombosis. The patient denies any chest pain. She describes mild exertional dyspnea with no significant palpitations.   Past Medical History:  Diagnosis Date  . Atrial fibrillation with normal ventricular rate ([place]) 10/2015   Rate control, no anticoagulation secondary to GI bleed  . Cancer [facility])    thyroid cancer s/p thyroidectomy  . Hypertension   . Iatrogenic hypothyroidism   . Stroke [facility]) 2003   Chronic right-sided weakness  . Thyroid disease     Past Surgical History:  Procedure Laterality Date  . ABDOMINAL HYSTERECTOMY    . APPENDECTOMY    . CHOLECYSTECTOMY    . THYROIDECTOMY    . VARICOSE VEIN SURGERY       Current Outpatient Prescriptions  Medication Sig Dispense Refill  . aspirin 81 MG EC tablet Take 81 mg by mouth daily. Swallow whole.    Marland Kitchen atorvastatin (LIPITOR) 10 MG  tablet Take 1 tablet (10 mg total) by mouth daily at 6 PM. 40 tablet 0  . bisoprolol-hydrochlorothiazide (ZIAC) 2.5-6.25 MG per tablet Take 1 tablet by mouth daily.    . brimonidine (ALPHAGAN) 0.15 % ophthalmic solution Place 1 drop into both eyes 2 (two) times daily.    . Bromfenac Sodium (BROMSITE) 0.075 % SOLN Place 1 drop into the right eye 2 (two) times daily.    . cyclobenzaprine (FLEXERIL) 5 MG tablet Take 1 tablet (5 mg total) by mouth at bedtime. 30 tablet 0  . diclofenac (FLECTOR) 1.3 % PTCH Place 1 patch onto the skin 2 (two) times daily. 20 patch 0  . diltiazem (CARDIZEM) 120 MG tablet Take 0.5 tablets (60 mg total) by mouth 2 (two) times daily.    Marland Kitchen latanoprost (XALATAN) 0.005 % ophthalmic solution Place 1 drop into both eyes at bedtime.    Marland Kitchen levothyroxine (SYNTHROID, LEVOTHROID) 150 MCG tablet Take 1 tablet (150 mcg total) by mouth daily before breakfast.    . Loteprednol Etabonate 0.5 % GEL Place 1 drop into the right eye 4 (four) times daily.     No current facility-administered medications for this visit.     Allergies:   Sulfa antibiotics and Codeine    Social History:  The patient  reports that she quit smoking about 48 years ago. She has never used smokeless tobacco. She reports that she does not drink  alcohol or use drugs.   Family History:  The patient's family history includes Cancer in her father.    ROS:  Please see the history of present illness.   Otherwise, review of systems are positive for none.   All other systems are reviewed and negative.    PHYSICAL EXAM: VS:  BP 102/70   Pulse 82   Ht 5\' 7"  (1.702 m)   Wt 176 lb (79.8 kg)   BMI 27.57 kg/m  , BMI Body mass index is 27.57 kg/m. GEN: Well nourished, well developed, in no acute distress  HEENT: normal  Neck: no JVD, carotid bruits, or masses Cardiac: Irregularly irregular; no murmurs, rubs, or gallops, moderate bilateral leg swelling worse on the right side Respiratory:  clear to auscultation  bilaterally, normal work of breathing GI: soft, nontender, nondistended, + BS MS: no deformity or atrophy  Skin: warm and dry, no rash Neuro:  Strength and sensation are intact Psych: euthymic mood, full affect   EKG:  EKG is not ordered today. I reviewed her EKG in May which showed atrial fibrillation with controlled ventricular rate.   Recent Labs: 02/25/2017: ALT 13; BUN 21; Creatinine, Ser 1.13; Hemoglobin 14.8; Platelets 186; Potassium 3.5; Sodium 139    Lipid Panel No results found for: CHOL, TRIG, HDL, CHOLHDL, VLDL, LDLCALC, LDLDIRECT    Wt Readings from Last 3 Encounters:  07/22/17 176 lb (79.8 kg)  02/24/17 190 lb 7.6 oz (86.4 kg)  02/27/16 180 lb (81.6 kg)       No flowsheet data found.    ASSESSMENT AND PLAN:  1.  Chronic venous insufficiency with chronic left leg DVT affecting the popliteal vein: I have recommended leg elevation and also prescribed knee-high support stockings to be used during the day. The patient has an indication for anticoagulation due to chronic atrial fibrillation.  2. Chronic atrial fibrillation: Ventricular rate is controlled on diltiazem. CHADS VASc score is 6. Thus, there is strong indication for anticoagulation. She has not had any issues with GI bleeds since January 2017 and most recent CBC showed normal hemoglobin. Due to all of that, I think the benefits of anticoagulation outweigh the risks especially that she has been tolerating aspirin. I discussed this with the patient and her daughter-in-law and elected to start Eliquis 5 mg twice daily. Check CBC and basic metabolic profile in one month.    Disposition:   FU with me as needed and to reestablish with Dr. Percival Spanish in 6 months.   Signed,  Kathlyn Sacramento, MD  07/22/2017 10:27 AM    [place]

## 2017-07-22 NOTE — Patient Instructions (Signed)
Medication Instructions:  STOP- Aspirin START- Eliqius 5 mg daily  If you need a refill on your cardiac medications before your next appointment, please call your pharmacy.  Labwork: CBC and BMP in 1 Months HERE IN OUR OFFICE AT [facility]  Testing/Procedures: None Ordered  Follow-Up: Your physician wants you to follow-up in: 6 Months with Dr Percival Spanish. You should receive a reminder letter in the mail two months in advance. If you do not receive a letter, please call our office [phone].   Thank you for choosing [facility] at [facility]!!

## 2017-07-23 ENCOUNTER — Encounter (INDEPENDENT_AMBULATORY_CARE_PROVIDER_SITE_OTHER): Payer: Medicare Other | Admitting: Ophthalmology

## 2017-07-23 DIAGNOSIS — H35033 Hypertensive retinopathy, bilateral: Secondary | ICD-10-CM | POA: Diagnosis not present

## 2017-07-23 DIAGNOSIS — I10 Essential (primary) hypertension: Secondary | ICD-10-CM

## 2017-07-23 DIAGNOSIS — H43813 Vitreous degeneration, bilateral: Secondary | ICD-10-CM | POA: Diagnosis not present

## 2017-07-23 DIAGNOSIS — H35371 Puckering of macula, right eye: Secondary | ICD-10-CM | POA: Diagnosis not present

## 2017-07-23 DIAGNOSIS — H59031 Cystoid macular edema following cataract surgery, right eye: Secondary | ICD-10-CM

## 2017-08-15 DIAGNOSIS — E119 Type 2 diabetes mellitus without complications: Secondary | ICD-10-CM | POA: Diagnosis not present

## 2017-08-15 DIAGNOSIS — E559 Vitamin D deficiency, unspecified: Secondary | ICD-10-CM | POA: Diagnosis not present

## 2017-08-15 DIAGNOSIS — E038 Other specified hypothyroidism: Secondary | ICD-10-CM | POA: Diagnosis not present

## 2017-08-15 DIAGNOSIS — Z79899 Other long term (current) drug therapy: Secondary | ICD-10-CM | POA: Diagnosis not present

## 2017-08-15 DIAGNOSIS — E7849 Other hyperlipidemia: Secondary | ICD-10-CM | POA: Diagnosis not present

## 2017-08-15 DIAGNOSIS — D518 Other vitamin B12 deficiency anemias: Secondary | ICD-10-CM | POA: Diagnosis not present

## 2017-08-26 DIAGNOSIS — E785 Hyperlipidemia, unspecified: Secondary | ICD-10-CM | POA: Diagnosis not present

## 2017-08-26 DIAGNOSIS — M150 Primary generalized (osteo)arthritis: Secondary | ICD-10-CM | POA: Diagnosis not present

## 2017-08-26 DIAGNOSIS — I10 Essential (primary) hypertension: Secondary | ICD-10-CM | POA: Diagnosis not present

## 2017-08-26 DIAGNOSIS — Z0000 Encounter for general adult medical examination without abnormal findings: Secondary | ICD-10-CM | POA: Diagnosis not present

## 2017-09-02 DIAGNOSIS — R011 Cardiac murmur, unspecified: Secondary | ICD-10-CM | POA: Diagnosis not present

## 2017-09-03 DIAGNOSIS — Z23 Encounter for immunization: Secondary | ICD-10-CM | POA: Diagnosis not present

## 2017-09-09 DIAGNOSIS — I481 Persistent atrial fibrillation: Secondary | ICD-10-CM | POA: Diagnosis not present

## 2017-09-09 DIAGNOSIS — I10 Essential (primary) hypertension: Secondary | ICD-10-CM | POA: Diagnosis not present

## 2017-09-09 DIAGNOSIS — G8191 Hemiplegia, unspecified affecting right dominant side: Secondary | ICD-10-CM | POA: Diagnosis not present

## 2017-09-09 DIAGNOSIS — E785 Hyperlipidemia, unspecified: Secondary | ICD-10-CM | POA: Diagnosis not present

## 2017-09-10 ENCOUNTER — Encounter (INDEPENDENT_AMBULATORY_CARE_PROVIDER_SITE_OTHER): Payer: Medicare Other | Admitting: Ophthalmology

## 2017-09-10 DIAGNOSIS — H35371 Puckering of macula, right eye: Secondary | ICD-10-CM

## 2017-09-10 DIAGNOSIS — H35033 Hypertensive retinopathy, bilateral: Secondary | ICD-10-CM

## 2017-09-10 DIAGNOSIS — H59031 Cystoid macular edema following cataract surgery, right eye: Secondary | ICD-10-CM | POA: Diagnosis not present

## 2017-09-10 DIAGNOSIS — H43813 Vitreous degeneration, bilateral: Secondary | ICD-10-CM | POA: Diagnosis not present

## 2017-09-10 DIAGNOSIS — I10 Essential (primary) hypertension: Secondary | ICD-10-CM

## 2017-09-15 DIAGNOSIS — R279 Unspecified lack of coordination: Secondary | ICD-10-CM | POA: Diagnosis not present

## 2017-09-15 DIAGNOSIS — M6281 Muscle weakness (generalized): Secondary | ICD-10-CM | POA: Diagnosis not present

## 2017-09-15 DIAGNOSIS — I10 Essential (primary) hypertension: Secondary | ICD-10-CM | POA: Diagnosis not present

## 2017-09-15 DIAGNOSIS — I4891 Unspecified atrial fibrillation: Secondary | ICD-10-CM | POA: Diagnosis not present

## 2017-09-15 DIAGNOSIS — G8191 Hemiplegia, unspecified affecting right dominant side: Secondary | ICD-10-CM | POA: Diagnosis not present

## 2017-09-17 DIAGNOSIS — M6281 Muscle weakness (generalized): Secondary | ICD-10-CM | POA: Diagnosis not present

## 2017-09-17 DIAGNOSIS — R279 Unspecified lack of coordination: Secondary | ICD-10-CM | POA: Diagnosis not present

## 2017-09-17 DIAGNOSIS — G8191 Hemiplegia, unspecified affecting right dominant side: Secondary | ICD-10-CM | POA: Diagnosis not present

## 2017-09-17 DIAGNOSIS — I10 Essential (primary) hypertension: Secondary | ICD-10-CM | POA: Diagnosis not present

## 2017-09-17 DIAGNOSIS — I4891 Unspecified atrial fibrillation: Secondary | ICD-10-CM | POA: Diagnosis not present

## 2017-09-18 DIAGNOSIS — R279 Unspecified lack of coordination: Secondary | ICD-10-CM | POA: Diagnosis not present

## 2017-09-18 DIAGNOSIS — G8191 Hemiplegia, unspecified affecting right dominant side: Secondary | ICD-10-CM | POA: Diagnosis not present

## 2017-09-18 DIAGNOSIS — I4891 Unspecified atrial fibrillation: Secondary | ICD-10-CM | POA: Diagnosis not present

## 2017-09-18 DIAGNOSIS — M6281 Muscle weakness (generalized): Secondary | ICD-10-CM | POA: Diagnosis not present

## 2017-09-18 DIAGNOSIS — I10 Essential (primary) hypertension: Secondary | ICD-10-CM | POA: Diagnosis not present

## 2017-09-19 DIAGNOSIS — G8191 Hemiplegia, unspecified affecting right dominant side: Secondary | ICD-10-CM | POA: Diagnosis not present

## 2017-09-19 DIAGNOSIS — I10 Essential (primary) hypertension: Secondary | ICD-10-CM | POA: Diagnosis not present

## 2017-09-19 DIAGNOSIS — R279 Unspecified lack of coordination: Secondary | ICD-10-CM | POA: Diagnosis not present

## 2017-09-19 DIAGNOSIS — I4891 Unspecified atrial fibrillation: Secondary | ICD-10-CM | POA: Diagnosis not present

## 2017-09-19 DIAGNOSIS — M6281 Muscle weakness (generalized): Secondary | ICD-10-CM | POA: Diagnosis not present

## 2017-09-22 DIAGNOSIS — I10 Essential (primary) hypertension: Secondary | ICD-10-CM | POA: Diagnosis not present

## 2017-09-22 DIAGNOSIS — I4891 Unspecified atrial fibrillation: Secondary | ICD-10-CM | POA: Diagnosis not present

## 2017-09-22 DIAGNOSIS — R279 Unspecified lack of coordination: Secondary | ICD-10-CM | POA: Diagnosis not present

## 2017-09-22 DIAGNOSIS — M6281 Muscle weakness (generalized): Secondary | ICD-10-CM | POA: Diagnosis not present

## 2017-09-22 DIAGNOSIS — G8191 Hemiplegia, unspecified affecting right dominant side: Secondary | ICD-10-CM | POA: Diagnosis not present

## 2017-09-24 DIAGNOSIS — G8191 Hemiplegia, unspecified affecting right dominant side: Secondary | ICD-10-CM | POA: Diagnosis not present

## 2017-09-24 DIAGNOSIS — M6281 Muscle weakness (generalized): Secondary | ICD-10-CM | POA: Diagnosis not present

## 2017-09-24 DIAGNOSIS — I10 Essential (primary) hypertension: Secondary | ICD-10-CM | POA: Diagnosis not present

## 2017-09-24 DIAGNOSIS — I4891 Unspecified atrial fibrillation: Secondary | ICD-10-CM | POA: Diagnosis not present

## 2017-09-24 DIAGNOSIS — R279 Unspecified lack of coordination: Secondary | ICD-10-CM | POA: Diagnosis not present

## 2017-09-26 DIAGNOSIS — I10 Essential (primary) hypertension: Secondary | ICD-10-CM | POA: Diagnosis not present

## 2017-09-26 DIAGNOSIS — G8191 Hemiplegia, unspecified affecting right dominant side: Secondary | ICD-10-CM | POA: Diagnosis not present

## 2017-09-26 DIAGNOSIS — M6281 Muscle weakness (generalized): Secondary | ICD-10-CM | POA: Diagnosis not present

## 2017-09-26 DIAGNOSIS — I4891 Unspecified atrial fibrillation: Secondary | ICD-10-CM | POA: Diagnosis not present

## 2017-09-26 DIAGNOSIS — R279 Unspecified lack of coordination: Secondary | ICD-10-CM | POA: Diagnosis not present

## 2017-09-27 DIAGNOSIS — I10 Essential (primary) hypertension: Secondary | ICD-10-CM | POA: Diagnosis not present

## 2017-09-27 DIAGNOSIS — G8191 Hemiplegia, unspecified affecting right dominant side: Secondary | ICD-10-CM | POA: Diagnosis not present

## 2017-09-27 DIAGNOSIS — I4891 Unspecified atrial fibrillation: Secondary | ICD-10-CM | POA: Diagnosis not present

## 2017-09-27 DIAGNOSIS — M6281 Muscle weakness (generalized): Secondary | ICD-10-CM | POA: Diagnosis not present

## 2017-09-27 DIAGNOSIS — R279 Unspecified lack of coordination: Secondary | ICD-10-CM | POA: Diagnosis not present

## 2017-09-29 DIAGNOSIS — M6281 Muscle weakness (generalized): Secondary | ICD-10-CM | POA: Diagnosis not present

## 2017-09-29 DIAGNOSIS — G8191 Hemiplegia, unspecified affecting right dominant side: Secondary | ICD-10-CM | POA: Diagnosis not present

## 2017-09-29 DIAGNOSIS — R279 Unspecified lack of coordination: Secondary | ICD-10-CM | POA: Diagnosis not present

## 2017-09-29 DIAGNOSIS — I10 Essential (primary) hypertension: Secondary | ICD-10-CM | POA: Diagnosis not present

## 2017-09-29 DIAGNOSIS — I4891 Unspecified atrial fibrillation: Secondary | ICD-10-CM | POA: Diagnosis not present

## 2017-10-01 DIAGNOSIS — I10 Essential (primary) hypertension: Secondary | ICD-10-CM | POA: Diagnosis not present

## 2017-10-01 DIAGNOSIS — I4891 Unspecified atrial fibrillation: Secondary | ICD-10-CM | POA: Diagnosis not present

## 2017-10-01 DIAGNOSIS — R279 Unspecified lack of coordination: Secondary | ICD-10-CM | POA: Diagnosis not present

## 2017-10-01 DIAGNOSIS — G8191 Hemiplegia, unspecified affecting right dominant side: Secondary | ICD-10-CM | POA: Diagnosis not present

## 2017-10-01 DIAGNOSIS — M6281 Muscle weakness (generalized): Secondary | ICD-10-CM | POA: Diagnosis not present

## 2017-10-03 DIAGNOSIS — R279 Unspecified lack of coordination: Secondary | ICD-10-CM | POA: Diagnosis not present

## 2017-10-03 DIAGNOSIS — I10 Essential (primary) hypertension: Secondary | ICD-10-CM | POA: Diagnosis not present

## 2017-10-03 DIAGNOSIS — G8191 Hemiplegia, unspecified affecting right dominant side: Secondary | ICD-10-CM | POA: Diagnosis not present

## 2017-10-03 DIAGNOSIS — I4891 Unspecified atrial fibrillation: Secondary | ICD-10-CM | POA: Diagnosis not present

## 2017-10-03 DIAGNOSIS — M6281 Muscle weakness (generalized): Secondary | ICD-10-CM | POA: Diagnosis not present

## 2017-10-04 DIAGNOSIS — R279 Unspecified lack of coordination: Secondary | ICD-10-CM | POA: Diagnosis not present

## 2017-10-04 DIAGNOSIS — I4891 Unspecified atrial fibrillation: Secondary | ICD-10-CM | POA: Diagnosis not present

## 2017-10-04 DIAGNOSIS — M6281 Muscle weakness (generalized): Secondary | ICD-10-CM | POA: Diagnosis not present

## 2017-10-04 DIAGNOSIS — G8191 Hemiplegia, unspecified affecting right dominant side: Secondary | ICD-10-CM | POA: Diagnosis not present

## 2017-10-04 DIAGNOSIS — I10 Essential (primary) hypertension: Secondary | ICD-10-CM | POA: Diagnosis not present

## 2017-10-06 DIAGNOSIS — I4891 Unspecified atrial fibrillation: Secondary | ICD-10-CM | POA: Diagnosis not present

## 2017-10-06 DIAGNOSIS — G8191 Hemiplegia, unspecified affecting right dominant side: Secondary | ICD-10-CM | POA: Diagnosis not present

## 2017-10-06 DIAGNOSIS — M6281 Muscle weakness (generalized): Secondary | ICD-10-CM | POA: Diagnosis not present

## 2017-10-06 DIAGNOSIS — R279 Unspecified lack of coordination: Secondary | ICD-10-CM | POA: Diagnosis not present

## 2017-10-06 DIAGNOSIS — I10 Essential (primary) hypertension: Secondary | ICD-10-CM | POA: Diagnosis not present

## 2017-10-08 DIAGNOSIS — M6281 Muscle weakness (generalized): Secondary | ICD-10-CM | POA: Diagnosis not present

## 2017-10-08 DIAGNOSIS — R279 Unspecified lack of coordination: Secondary | ICD-10-CM | POA: Diagnosis not present

## 2017-10-08 DIAGNOSIS — I10 Essential (primary) hypertension: Secondary | ICD-10-CM | POA: Diagnosis not present

## 2017-10-08 DIAGNOSIS — G8191 Hemiplegia, unspecified affecting right dominant side: Secondary | ICD-10-CM | POA: Diagnosis not present

## 2017-10-08 DIAGNOSIS — I4891 Unspecified atrial fibrillation: Secondary | ICD-10-CM | POA: Diagnosis not present

## 2017-10-09 DIAGNOSIS — I4891 Unspecified atrial fibrillation: Secondary | ICD-10-CM | POA: Diagnosis not present

## 2017-10-09 DIAGNOSIS — R279 Unspecified lack of coordination: Secondary | ICD-10-CM | POA: Diagnosis not present

## 2017-10-09 DIAGNOSIS — M6281 Muscle weakness (generalized): Secondary | ICD-10-CM | POA: Diagnosis not present

## 2017-10-09 DIAGNOSIS — I10 Essential (primary) hypertension: Secondary | ICD-10-CM | POA: Diagnosis not present

## 2017-10-09 DIAGNOSIS — G8191 Hemiplegia, unspecified affecting right dominant side: Secondary | ICD-10-CM | POA: Diagnosis not present

## 2017-10-13 DIAGNOSIS — G8191 Hemiplegia, unspecified affecting right dominant side: Secondary | ICD-10-CM | POA: Diagnosis not present

## 2017-10-13 DIAGNOSIS — I10 Essential (primary) hypertension: Secondary | ICD-10-CM | POA: Diagnosis not present

## 2017-10-13 DIAGNOSIS — R279 Unspecified lack of coordination: Secondary | ICD-10-CM | POA: Diagnosis not present

## 2017-10-13 DIAGNOSIS — I4891 Unspecified atrial fibrillation: Secondary | ICD-10-CM | POA: Diagnosis not present

## 2017-10-13 DIAGNOSIS — M6281 Muscle weakness (generalized): Secondary | ICD-10-CM | POA: Diagnosis not present

## 2017-10-15 DIAGNOSIS — R279 Unspecified lack of coordination: Secondary | ICD-10-CM | POA: Diagnosis not present

## 2017-10-15 DIAGNOSIS — M6281 Muscle weakness (generalized): Secondary | ICD-10-CM | POA: Diagnosis not present

## 2017-10-15 DIAGNOSIS — I10 Essential (primary) hypertension: Secondary | ICD-10-CM | POA: Diagnosis not present

## 2017-10-15 DIAGNOSIS — I4891 Unspecified atrial fibrillation: Secondary | ICD-10-CM | POA: Diagnosis not present

## 2017-10-15 DIAGNOSIS — G8191 Hemiplegia, unspecified affecting right dominant side: Secondary | ICD-10-CM | POA: Diagnosis not present

## 2017-10-16 DIAGNOSIS — R279 Unspecified lack of coordination: Secondary | ICD-10-CM | POA: Diagnosis not present

## 2017-10-16 DIAGNOSIS — G8191 Hemiplegia, unspecified affecting right dominant side: Secondary | ICD-10-CM | POA: Diagnosis not present

## 2017-10-16 DIAGNOSIS — I4891 Unspecified atrial fibrillation: Secondary | ICD-10-CM | POA: Diagnosis not present

## 2017-10-16 DIAGNOSIS — I10 Essential (primary) hypertension: Secondary | ICD-10-CM | POA: Diagnosis not present

## 2017-10-16 DIAGNOSIS — M6281 Muscle weakness (generalized): Secondary | ICD-10-CM | POA: Diagnosis not present

## 2017-10-21 DIAGNOSIS — G8191 Hemiplegia, unspecified affecting right dominant side: Secondary | ICD-10-CM | POA: Diagnosis not present

## 2017-10-21 DIAGNOSIS — R279 Unspecified lack of coordination: Secondary | ICD-10-CM | POA: Diagnosis not present

## 2017-10-21 DIAGNOSIS — I4891 Unspecified atrial fibrillation: Secondary | ICD-10-CM | POA: Diagnosis not present

## 2017-10-21 DIAGNOSIS — M6281 Muscle weakness (generalized): Secondary | ICD-10-CM | POA: Diagnosis not present

## 2017-10-21 DIAGNOSIS — I10 Essential (primary) hypertension: Secondary | ICD-10-CM | POA: Diagnosis not present

## 2017-10-23 DIAGNOSIS — G8191 Hemiplegia, unspecified affecting right dominant side: Secondary | ICD-10-CM | POA: Diagnosis not present

## 2017-10-23 DIAGNOSIS — I10 Essential (primary) hypertension: Secondary | ICD-10-CM | POA: Diagnosis not present

## 2017-10-23 DIAGNOSIS — R279 Unspecified lack of coordination: Secondary | ICD-10-CM | POA: Diagnosis not present

## 2017-10-23 DIAGNOSIS — I4891 Unspecified atrial fibrillation: Secondary | ICD-10-CM | POA: Diagnosis not present

## 2017-10-23 DIAGNOSIS — M6281 Muscle weakness (generalized): Secondary | ICD-10-CM | POA: Diagnosis not present

## 2017-10-24 ENCOUNTER — Telehealth: Payer: Self-pay | Admitting: Cardiovascular Disease

## 2017-10-24 NOTE — Telephone Encounter (Signed)
New message      Needs order DC the aspirin for patient, the family did not bring back paper work from October visit    Send order to [facility] [phone]

## 2017-10-24 NOTE — Telephone Encounter (Signed)
Returned the call to [place] at [employer]. She stated that [facility] needs a discontinue order for the Aspirin 81 mg. The patient was started on Eliquis and therefore the aspirin was stopped.  Called placed to [employer]. They stated that they could not take a verbal but would need an actual order.   Caryl Pina has been called back and a message left to fax a discontinue order to our office so we may fill it out.

## 2017-10-24 NOTE — Telephone Encounter (Signed)
Spoke with Caryl Pina. She will fax a doctors order form. The discontinue order will be faxed to [place] next week when the provider is in the office. Caryl Pina verbalized her understanding.

## 2017-10-25 DIAGNOSIS — R279 Unspecified lack of coordination: Secondary | ICD-10-CM | POA: Diagnosis not present

## 2017-10-25 DIAGNOSIS — G8191 Hemiplegia, unspecified affecting right dominant side: Secondary | ICD-10-CM | POA: Diagnosis not present

## 2017-10-25 DIAGNOSIS — I10 Essential (primary) hypertension: Secondary | ICD-10-CM | POA: Diagnosis not present

## 2017-10-25 DIAGNOSIS — I4891 Unspecified atrial fibrillation: Secondary | ICD-10-CM | POA: Diagnosis not present

## 2017-10-25 DIAGNOSIS — M6281 Muscle weakness (generalized): Secondary | ICD-10-CM | POA: Diagnosis not present

## 2017-10-29 DIAGNOSIS — G8191 Hemiplegia, unspecified affecting right dominant side: Secondary | ICD-10-CM | POA: Diagnosis not present

## 2017-10-29 DIAGNOSIS — M6281 Muscle weakness (generalized): Secondary | ICD-10-CM | POA: Diagnosis not present

## 2017-10-29 DIAGNOSIS — I4891 Unspecified atrial fibrillation: Secondary | ICD-10-CM | POA: Diagnosis not present

## 2017-10-29 DIAGNOSIS — R279 Unspecified lack of coordination: Secondary | ICD-10-CM | POA: Diagnosis not present

## 2017-10-29 DIAGNOSIS — I10 Essential (primary) hypertension: Secondary | ICD-10-CM | POA: Diagnosis not present

## 2017-10-30 ENCOUNTER — Encounter (INDEPENDENT_AMBULATORY_CARE_PROVIDER_SITE_OTHER): Payer: Medicare Other | Admitting: Ophthalmology

## 2017-10-30 DIAGNOSIS — H59031 Cystoid macular edema following cataract surgery, right eye: Secondary | ICD-10-CM | POA: Diagnosis not present

## 2017-10-30 DIAGNOSIS — H35371 Puckering of macula, right eye: Secondary | ICD-10-CM

## 2017-10-30 DIAGNOSIS — I10 Essential (primary) hypertension: Secondary | ICD-10-CM

## 2017-10-30 DIAGNOSIS — H35033 Hypertensive retinopathy, bilateral: Secondary | ICD-10-CM | POA: Diagnosis not present

## 2017-10-30 DIAGNOSIS — H43813 Vitreous degeneration, bilateral: Secondary | ICD-10-CM | POA: Diagnosis not present

## 2017-10-31 DIAGNOSIS — G8191 Hemiplegia, unspecified affecting right dominant side: Secondary | ICD-10-CM | POA: Diagnosis not present

## 2017-10-31 DIAGNOSIS — I10 Essential (primary) hypertension: Secondary | ICD-10-CM | POA: Diagnosis not present

## 2017-10-31 DIAGNOSIS — R279 Unspecified lack of coordination: Secondary | ICD-10-CM | POA: Diagnosis not present

## 2017-10-31 DIAGNOSIS — I4891 Unspecified atrial fibrillation: Secondary | ICD-10-CM | POA: Diagnosis not present

## 2017-10-31 DIAGNOSIS — M6281 Muscle weakness (generalized): Secondary | ICD-10-CM | POA: Diagnosis not present

## 2017-11-02 DIAGNOSIS — F0390 Unspecified dementia without behavioral disturbance: Secondary | ICD-10-CM | POA: Diagnosis not present

## 2017-11-02 DIAGNOSIS — I481 Persistent atrial fibrillation: Secondary | ICD-10-CM | POA: Diagnosis not present

## 2017-11-02 DIAGNOSIS — I10 Essential (primary) hypertension: Secondary | ICD-10-CM | POA: Diagnosis not present

## 2017-11-02 DIAGNOSIS — E785 Hyperlipidemia, unspecified: Secondary | ICD-10-CM | POA: Diagnosis not present

## 2017-11-04 DIAGNOSIS — M6281 Muscle weakness (generalized): Secondary | ICD-10-CM | POA: Diagnosis not present

## 2017-11-04 DIAGNOSIS — G8191 Hemiplegia, unspecified affecting right dominant side: Secondary | ICD-10-CM | POA: Diagnosis not present

## 2017-11-04 DIAGNOSIS — I10 Essential (primary) hypertension: Secondary | ICD-10-CM | POA: Diagnosis not present

## 2017-11-04 DIAGNOSIS — R279 Unspecified lack of coordination: Secondary | ICD-10-CM | POA: Diagnosis not present

## 2017-11-04 DIAGNOSIS — I4891 Unspecified atrial fibrillation: Secondary | ICD-10-CM | POA: Diagnosis not present

## 2017-11-06 DIAGNOSIS — I10 Essential (primary) hypertension: Secondary | ICD-10-CM | POA: Diagnosis not present

## 2017-11-06 DIAGNOSIS — R279 Unspecified lack of coordination: Secondary | ICD-10-CM | POA: Diagnosis not present

## 2017-11-06 DIAGNOSIS — I4891 Unspecified atrial fibrillation: Secondary | ICD-10-CM | POA: Diagnosis not present

## 2017-11-06 DIAGNOSIS — M6281 Muscle weakness (generalized): Secondary | ICD-10-CM | POA: Diagnosis not present

## 2017-11-06 DIAGNOSIS — G8191 Hemiplegia, unspecified affecting right dominant side: Secondary | ICD-10-CM | POA: Diagnosis not present

## 2017-11-07 DIAGNOSIS — I4891 Unspecified atrial fibrillation: Secondary | ICD-10-CM | POA: Diagnosis not present

## 2017-11-07 DIAGNOSIS — R279 Unspecified lack of coordination: Secondary | ICD-10-CM | POA: Diagnosis not present

## 2017-11-07 DIAGNOSIS — M6281 Muscle weakness (generalized): Secondary | ICD-10-CM | POA: Diagnosis not present

## 2017-11-07 DIAGNOSIS — G8191 Hemiplegia, unspecified affecting right dominant side: Secondary | ICD-10-CM | POA: Diagnosis not present

## 2017-11-07 DIAGNOSIS — I10 Essential (primary) hypertension: Secondary | ICD-10-CM | POA: Diagnosis not present

## 2017-11-11 DIAGNOSIS — I10 Essential (primary) hypertension: Secondary | ICD-10-CM | POA: Diagnosis not present

## 2017-11-11 DIAGNOSIS — I482 Chronic atrial fibrillation: Secondary | ICD-10-CM | POA: Diagnosis not present

## 2017-11-11 DIAGNOSIS — M150 Primary generalized (osteo)arthritis: Secondary | ICD-10-CM | POA: Diagnosis not present

## 2017-11-11 DIAGNOSIS — N390 Urinary tract infection, site not specified: Secondary | ICD-10-CM | POA: Diagnosis not present

## 2017-11-11 DIAGNOSIS — M5441 Lumbago with sciatica, right side: Secondary | ICD-10-CM | POA: Diagnosis not present

## 2017-11-13 DIAGNOSIS — G8191 Hemiplegia, unspecified affecting right dominant side: Secondary | ICD-10-CM | POA: Diagnosis not present

## 2017-11-13 DIAGNOSIS — I4891 Unspecified atrial fibrillation: Secondary | ICD-10-CM | POA: Diagnosis not present

## 2017-11-13 DIAGNOSIS — R279 Unspecified lack of coordination: Secondary | ICD-10-CM | POA: Diagnosis not present

## 2017-11-13 DIAGNOSIS — M6281 Muscle weakness (generalized): Secondary | ICD-10-CM | POA: Diagnosis not present

## 2017-11-13 DIAGNOSIS — I10 Essential (primary) hypertension: Secondary | ICD-10-CM | POA: Diagnosis not present

## 2017-11-14 DIAGNOSIS — D518 Other vitamin B12 deficiency anemias: Secondary | ICD-10-CM | POA: Diagnosis not present

## 2017-11-14 DIAGNOSIS — E559 Vitamin D deficiency, unspecified: Secondary | ICD-10-CM | POA: Diagnosis not present

## 2017-11-14 DIAGNOSIS — Z79899 Other long term (current) drug therapy: Secondary | ICD-10-CM | POA: Diagnosis not present

## 2017-11-14 DIAGNOSIS — E7849 Other hyperlipidemia: Secondary | ICD-10-CM | POA: Diagnosis not present

## 2017-11-14 DIAGNOSIS — E038 Other specified hypothyroidism: Secondary | ICD-10-CM | POA: Diagnosis not present

## 2017-11-14 DIAGNOSIS — E119 Type 2 diabetes mellitus without complications: Secondary | ICD-10-CM | POA: Diagnosis not present

## 2017-11-15 DIAGNOSIS — I4891 Unspecified atrial fibrillation: Secondary | ICD-10-CM | POA: Diagnosis not present

## 2017-11-15 DIAGNOSIS — G8191 Hemiplegia, unspecified affecting right dominant side: Secondary | ICD-10-CM | POA: Diagnosis not present

## 2017-11-15 DIAGNOSIS — M6281 Muscle weakness (generalized): Secondary | ICD-10-CM | POA: Diagnosis not present

## 2017-11-15 DIAGNOSIS — R279 Unspecified lack of coordination: Secondary | ICD-10-CM | POA: Diagnosis not present

## 2017-11-15 DIAGNOSIS — I10 Essential (primary) hypertension: Secondary | ICD-10-CM | POA: Diagnosis not present

## 2017-11-18 DIAGNOSIS — R279 Unspecified lack of coordination: Secondary | ICD-10-CM | POA: Diagnosis not present

## 2017-11-18 DIAGNOSIS — I10 Essential (primary) hypertension: Secondary | ICD-10-CM | POA: Diagnosis not present

## 2017-11-18 DIAGNOSIS — I4891 Unspecified atrial fibrillation: Secondary | ICD-10-CM | POA: Diagnosis not present

## 2017-11-18 DIAGNOSIS — M6281 Muscle weakness (generalized): Secondary | ICD-10-CM | POA: Diagnosis not present

## 2017-11-18 DIAGNOSIS — G8191 Hemiplegia, unspecified affecting right dominant side: Secondary | ICD-10-CM | POA: Diagnosis not present

## 2017-11-20 DIAGNOSIS — M6281 Muscle weakness (generalized): Secondary | ICD-10-CM | POA: Diagnosis not present

## 2017-11-20 DIAGNOSIS — I10 Essential (primary) hypertension: Secondary | ICD-10-CM | POA: Diagnosis not present

## 2017-11-20 DIAGNOSIS — I4891 Unspecified atrial fibrillation: Secondary | ICD-10-CM | POA: Diagnosis not present

## 2017-11-20 DIAGNOSIS — G8191 Hemiplegia, unspecified affecting right dominant side: Secondary | ICD-10-CM | POA: Diagnosis not present

## 2017-11-20 DIAGNOSIS — R279 Unspecified lack of coordination: Secondary | ICD-10-CM | POA: Diagnosis not present

## 2017-11-22 DIAGNOSIS — R279 Unspecified lack of coordination: Secondary | ICD-10-CM | POA: Diagnosis not present

## 2017-11-22 DIAGNOSIS — G8191 Hemiplegia, unspecified affecting right dominant side: Secondary | ICD-10-CM | POA: Diagnosis not present

## 2017-11-22 DIAGNOSIS — I4891 Unspecified atrial fibrillation: Secondary | ICD-10-CM | POA: Diagnosis not present

## 2017-11-22 DIAGNOSIS — M6281 Muscle weakness (generalized): Secondary | ICD-10-CM | POA: Diagnosis not present

## 2017-11-22 DIAGNOSIS — I10 Essential (primary) hypertension: Secondary | ICD-10-CM | POA: Diagnosis not present

## 2017-11-26 DIAGNOSIS — R279 Unspecified lack of coordination: Secondary | ICD-10-CM | POA: Diagnosis not present

## 2017-11-26 DIAGNOSIS — I4891 Unspecified atrial fibrillation: Secondary | ICD-10-CM | POA: Diagnosis not present

## 2017-11-26 DIAGNOSIS — M6281 Muscle weakness (generalized): Secondary | ICD-10-CM | POA: Diagnosis not present

## 2017-11-26 DIAGNOSIS — G8191 Hemiplegia, unspecified affecting right dominant side: Secondary | ICD-10-CM | POA: Diagnosis not present

## 2017-11-26 DIAGNOSIS — I10 Essential (primary) hypertension: Secondary | ICD-10-CM | POA: Diagnosis not present

## 2017-11-27 DIAGNOSIS — M6281 Muscle weakness (generalized): Secondary | ICD-10-CM | POA: Diagnosis not present

## 2017-11-27 DIAGNOSIS — R279 Unspecified lack of coordination: Secondary | ICD-10-CM | POA: Diagnosis not present

## 2017-11-27 DIAGNOSIS — G8191 Hemiplegia, unspecified affecting right dominant side: Secondary | ICD-10-CM | POA: Diagnosis not present

## 2017-11-27 DIAGNOSIS — I10 Essential (primary) hypertension: Secondary | ICD-10-CM | POA: Diagnosis not present

## 2017-11-27 DIAGNOSIS — I4891 Unspecified atrial fibrillation: Secondary | ICD-10-CM | POA: Diagnosis not present

## 2017-11-29 DIAGNOSIS — I10 Essential (primary) hypertension: Secondary | ICD-10-CM | POA: Diagnosis not present

## 2017-11-29 DIAGNOSIS — I4891 Unspecified atrial fibrillation: Secondary | ICD-10-CM | POA: Diagnosis not present

## 2017-11-29 DIAGNOSIS — G8191 Hemiplegia, unspecified affecting right dominant side: Secondary | ICD-10-CM | POA: Diagnosis not present

## 2017-11-29 DIAGNOSIS — M6281 Muscle weakness (generalized): Secondary | ICD-10-CM | POA: Diagnosis not present

## 2017-11-29 DIAGNOSIS — R279 Unspecified lack of coordination: Secondary | ICD-10-CM | POA: Diagnosis not present

## 2017-12-02 DIAGNOSIS — M6281 Muscle weakness (generalized): Secondary | ICD-10-CM | POA: Diagnosis not present

## 2017-12-02 DIAGNOSIS — I4891 Unspecified atrial fibrillation: Secondary | ICD-10-CM | POA: Diagnosis not present

## 2017-12-02 DIAGNOSIS — G8191 Hemiplegia, unspecified affecting right dominant side: Secondary | ICD-10-CM | POA: Diagnosis not present

## 2017-12-02 DIAGNOSIS — I10 Essential (primary) hypertension: Secondary | ICD-10-CM | POA: Diagnosis not present

## 2017-12-02 DIAGNOSIS — R279 Unspecified lack of coordination: Secondary | ICD-10-CM | POA: Diagnosis not present

## 2017-12-06 DIAGNOSIS — M6281 Muscle weakness (generalized): Secondary | ICD-10-CM | POA: Diagnosis not present

## 2017-12-06 DIAGNOSIS — R279 Unspecified lack of coordination: Secondary | ICD-10-CM | POA: Diagnosis not present

## 2017-12-06 DIAGNOSIS — I10 Essential (primary) hypertension: Secondary | ICD-10-CM | POA: Diagnosis not present

## 2017-12-06 DIAGNOSIS — I4891 Unspecified atrial fibrillation: Secondary | ICD-10-CM | POA: Diagnosis not present

## 2017-12-06 DIAGNOSIS — G8191 Hemiplegia, unspecified affecting right dominant side: Secondary | ICD-10-CM | POA: Diagnosis not present

## 2017-12-07 DIAGNOSIS — I10 Essential (primary) hypertension: Secondary | ICD-10-CM | POA: Diagnosis not present

## 2017-12-07 DIAGNOSIS — G8191 Hemiplegia, unspecified affecting right dominant side: Secondary | ICD-10-CM | POA: Diagnosis not present

## 2017-12-07 DIAGNOSIS — M6281 Muscle weakness (generalized): Secondary | ICD-10-CM | POA: Diagnosis not present

## 2017-12-07 DIAGNOSIS — I4891 Unspecified atrial fibrillation: Secondary | ICD-10-CM | POA: Diagnosis not present

## 2017-12-07 DIAGNOSIS — R279 Unspecified lack of coordination: Secondary | ICD-10-CM | POA: Diagnosis not present

## 2017-12-09 DIAGNOSIS — N390 Urinary tract infection, site not specified: Secondary | ICD-10-CM | POA: Diagnosis not present

## 2017-12-09 DIAGNOSIS — I10 Essential (primary) hypertension: Secondary | ICD-10-CM | POA: Diagnosis not present

## 2017-12-09 DIAGNOSIS — I481 Persistent atrial fibrillation: Secondary | ICD-10-CM | POA: Diagnosis not present

## 2017-12-09 DIAGNOSIS — M150 Primary generalized (osteo)arthritis: Secondary | ICD-10-CM | POA: Diagnosis not present

## 2017-12-10 DIAGNOSIS — I4891 Unspecified atrial fibrillation: Secondary | ICD-10-CM | POA: Diagnosis not present

## 2017-12-10 DIAGNOSIS — I10 Essential (primary) hypertension: Secondary | ICD-10-CM | POA: Diagnosis not present

## 2017-12-10 DIAGNOSIS — G8191 Hemiplegia, unspecified affecting right dominant side: Secondary | ICD-10-CM | POA: Diagnosis not present

## 2017-12-10 DIAGNOSIS — M6281 Muscle weakness (generalized): Secondary | ICD-10-CM | POA: Diagnosis not present

## 2017-12-10 DIAGNOSIS — R279 Unspecified lack of coordination: Secondary | ICD-10-CM | POA: Diagnosis not present

## 2017-12-11 DIAGNOSIS — M6281 Muscle weakness (generalized): Secondary | ICD-10-CM | POA: Diagnosis not present

## 2017-12-11 DIAGNOSIS — I10 Essential (primary) hypertension: Secondary | ICD-10-CM | POA: Diagnosis not present

## 2017-12-11 DIAGNOSIS — G8191 Hemiplegia, unspecified affecting right dominant side: Secondary | ICD-10-CM | POA: Diagnosis not present

## 2017-12-11 DIAGNOSIS — R279 Unspecified lack of coordination: Secondary | ICD-10-CM | POA: Diagnosis not present

## 2017-12-11 DIAGNOSIS — I4891 Unspecified atrial fibrillation: Secondary | ICD-10-CM | POA: Diagnosis not present

## 2017-12-12 DIAGNOSIS — M549 Dorsalgia, unspecified: Secondary | ICD-10-CM | POA: Diagnosis not present

## 2017-12-12 DIAGNOSIS — I69351 Hemiplegia and hemiparesis following cerebral infarction affecting right dominant side: Secondary | ICD-10-CM | POA: Diagnosis not present

## 2017-12-12 DIAGNOSIS — M542 Cervicalgia: Secondary | ICD-10-CM | POA: Diagnosis not present

## 2017-12-12 DIAGNOSIS — R51 Headache: Secondary | ICD-10-CM | POA: Diagnosis not present

## 2017-12-12 DIAGNOSIS — G8911 Acute pain due to trauma: Secondary | ICD-10-CM | POA: Diagnosis not present

## 2017-12-12 DIAGNOSIS — S0990XA Unspecified injury of head, initial encounter: Secondary | ICD-10-CM | POA: Diagnosis not present

## 2017-12-12 DIAGNOSIS — S199XXA Unspecified injury of neck, initial encounter: Secondary | ICD-10-CM | POA: Diagnosis not present

## 2017-12-18 ENCOUNTER — Encounter (INDEPENDENT_AMBULATORY_CARE_PROVIDER_SITE_OTHER): Payer: Medicare Other | Admitting: Ophthalmology

## 2017-12-18 DIAGNOSIS — H35371 Puckering of macula, right eye: Secondary | ICD-10-CM | POA: Diagnosis not present

## 2017-12-18 DIAGNOSIS — H35033 Hypertensive retinopathy, bilateral: Secondary | ICD-10-CM

## 2017-12-18 DIAGNOSIS — H59031 Cystoid macular edema following cataract surgery, right eye: Secondary | ICD-10-CM

## 2017-12-18 DIAGNOSIS — I10 Essential (primary) hypertension: Secondary | ICD-10-CM | POA: Diagnosis not present

## 2017-12-18 DIAGNOSIS — H43812 Vitreous degeneration, left eye: Secondary | ICD-10-CM | POA: Diagnosis not present

## 2017-12-18 IMAGING — DX DG THORACIC SPINE 2V
2 series · 3 of 3 positions shown · non-contrast
Comparison: 02/24/2017, 01/07/2015

CLINICAL DATA: History of CVA with right-sided weakness, tenderness
to the mid thoracic area

EXAM:
THORACIC SPINE 2 VIEWS

[Series 6: tl-spine flex · 0.14mm/px · 2 of 2 slices shown]
[im 1/2]
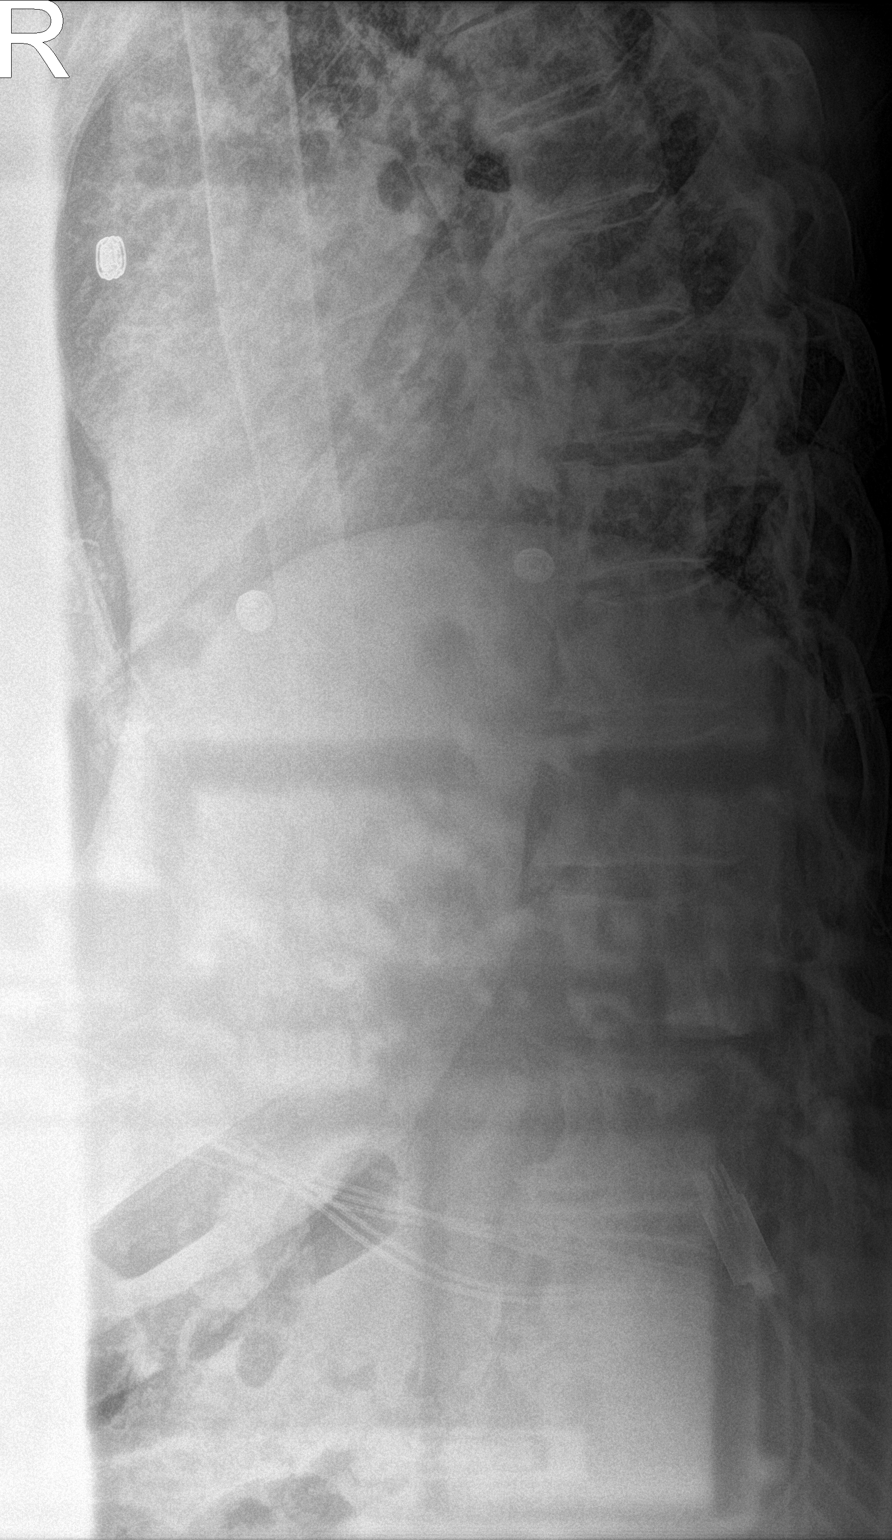
[im 2/2]
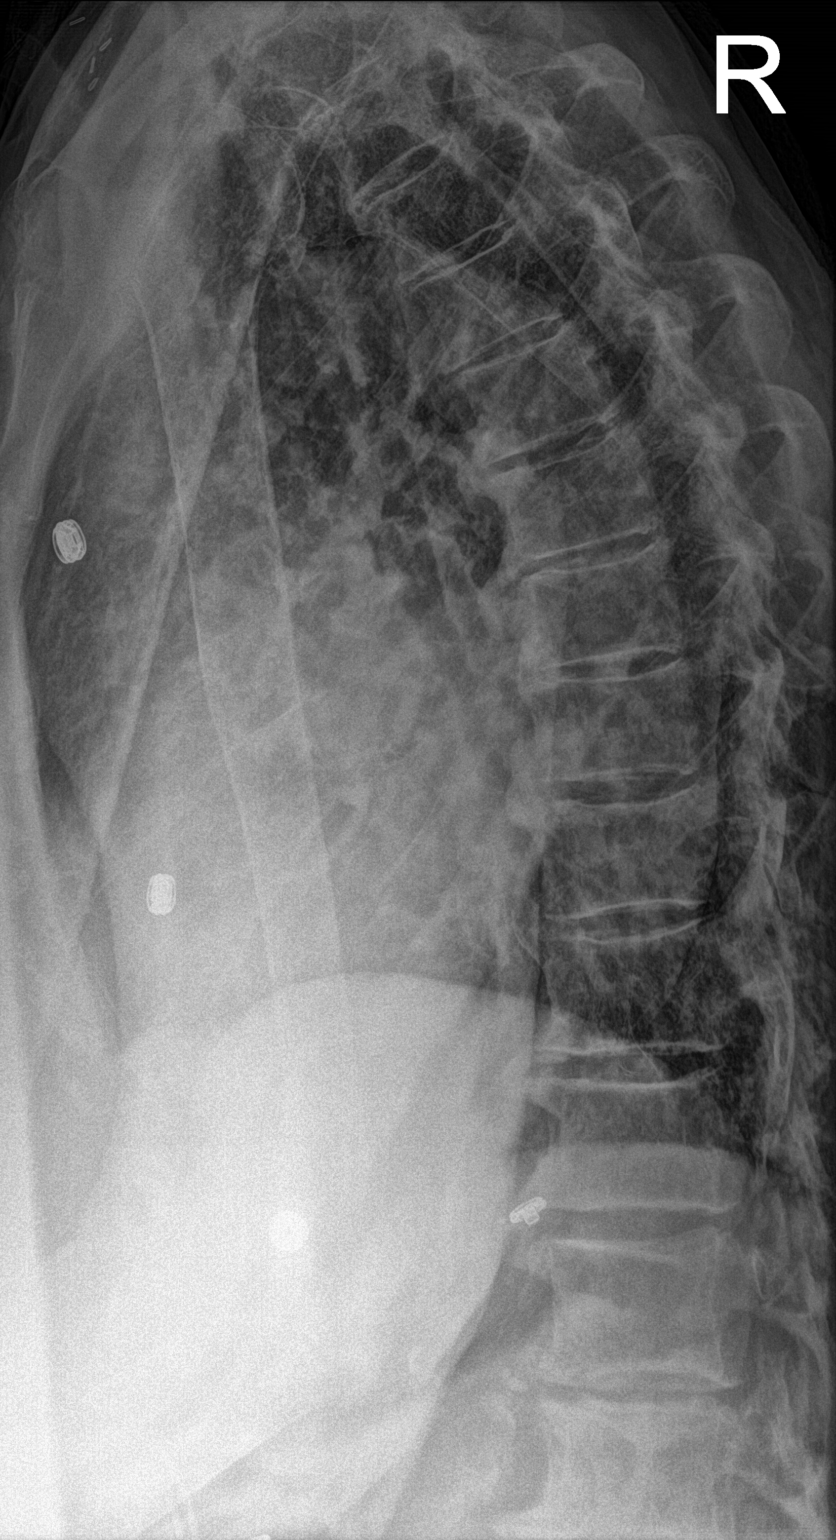

[t-spine flex]
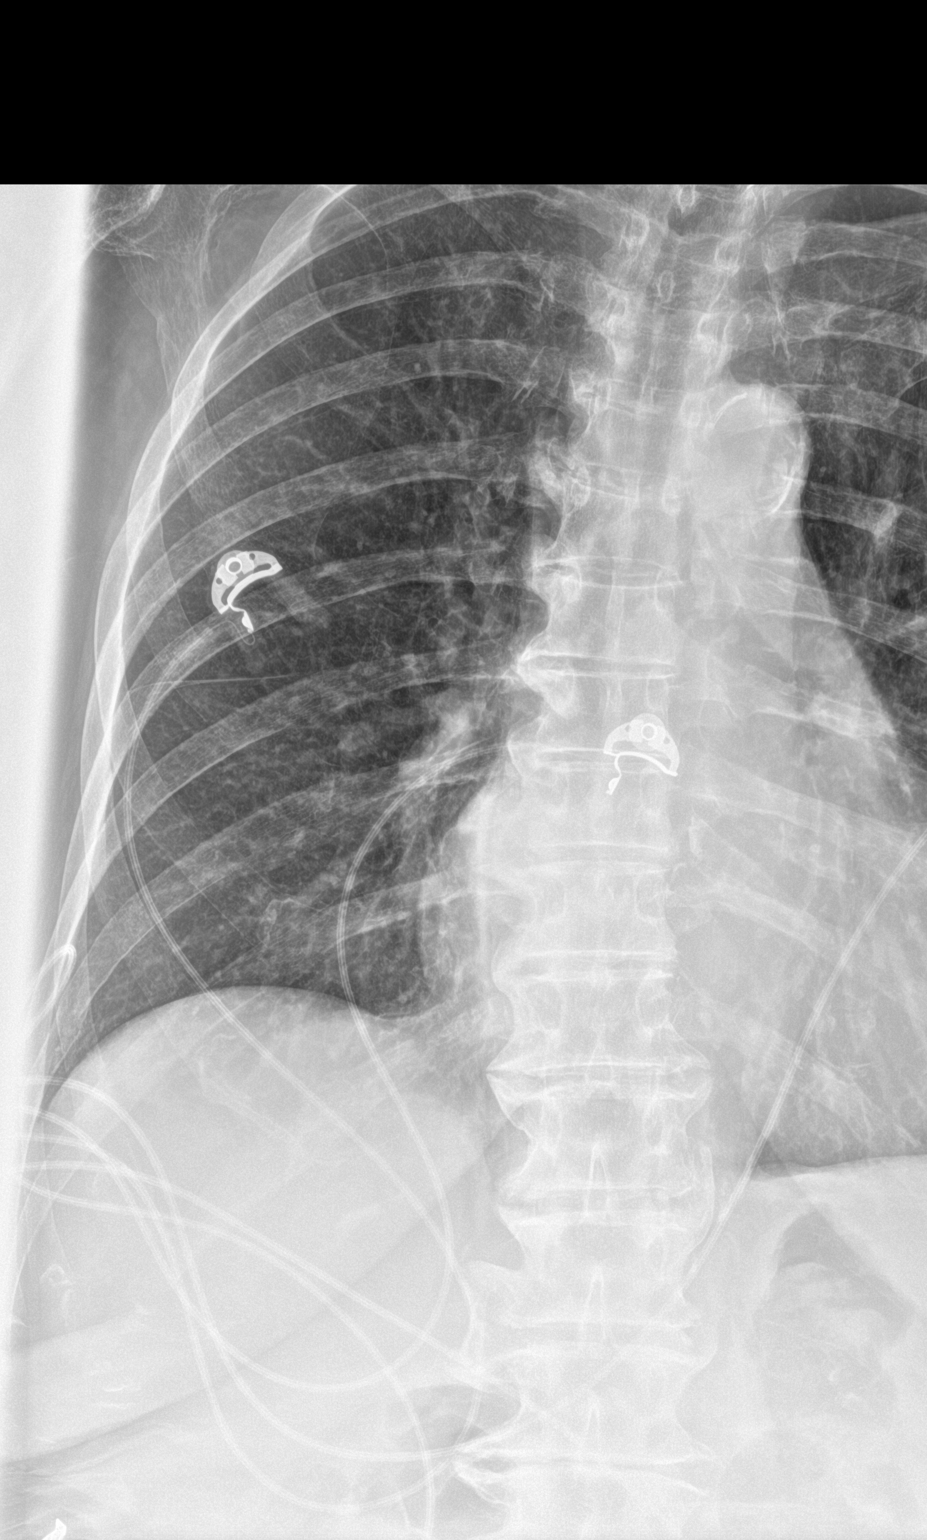

[3 of 3 positions shown; findings below may reference images not displayed]

FINDINGS: Surgical clips in the upper mediastinum. Aortic atherosclerosis.
Sagittal alignment is within normal limits. Multilevel osteophytes.
Vertebral body heights are maintained.
IMPRESSION: Degenerative changes.  No definite acute osseous abnormality.

## 2017-12-18 IMAGING — CR DG CHEST 2V
2 series · 2 of 2 positions shown · non-contrast
Comparison: 05/30/2016

CLINICAL DATA: Chronic left shoulder pain. Generalized weakness
today. Hx thyroid cancer. Hx a-fib. Hx htn. Hx stroke. Right side
weakness since stroke.

EXAM:
CHEST  2 VIEW

[w chest lat]
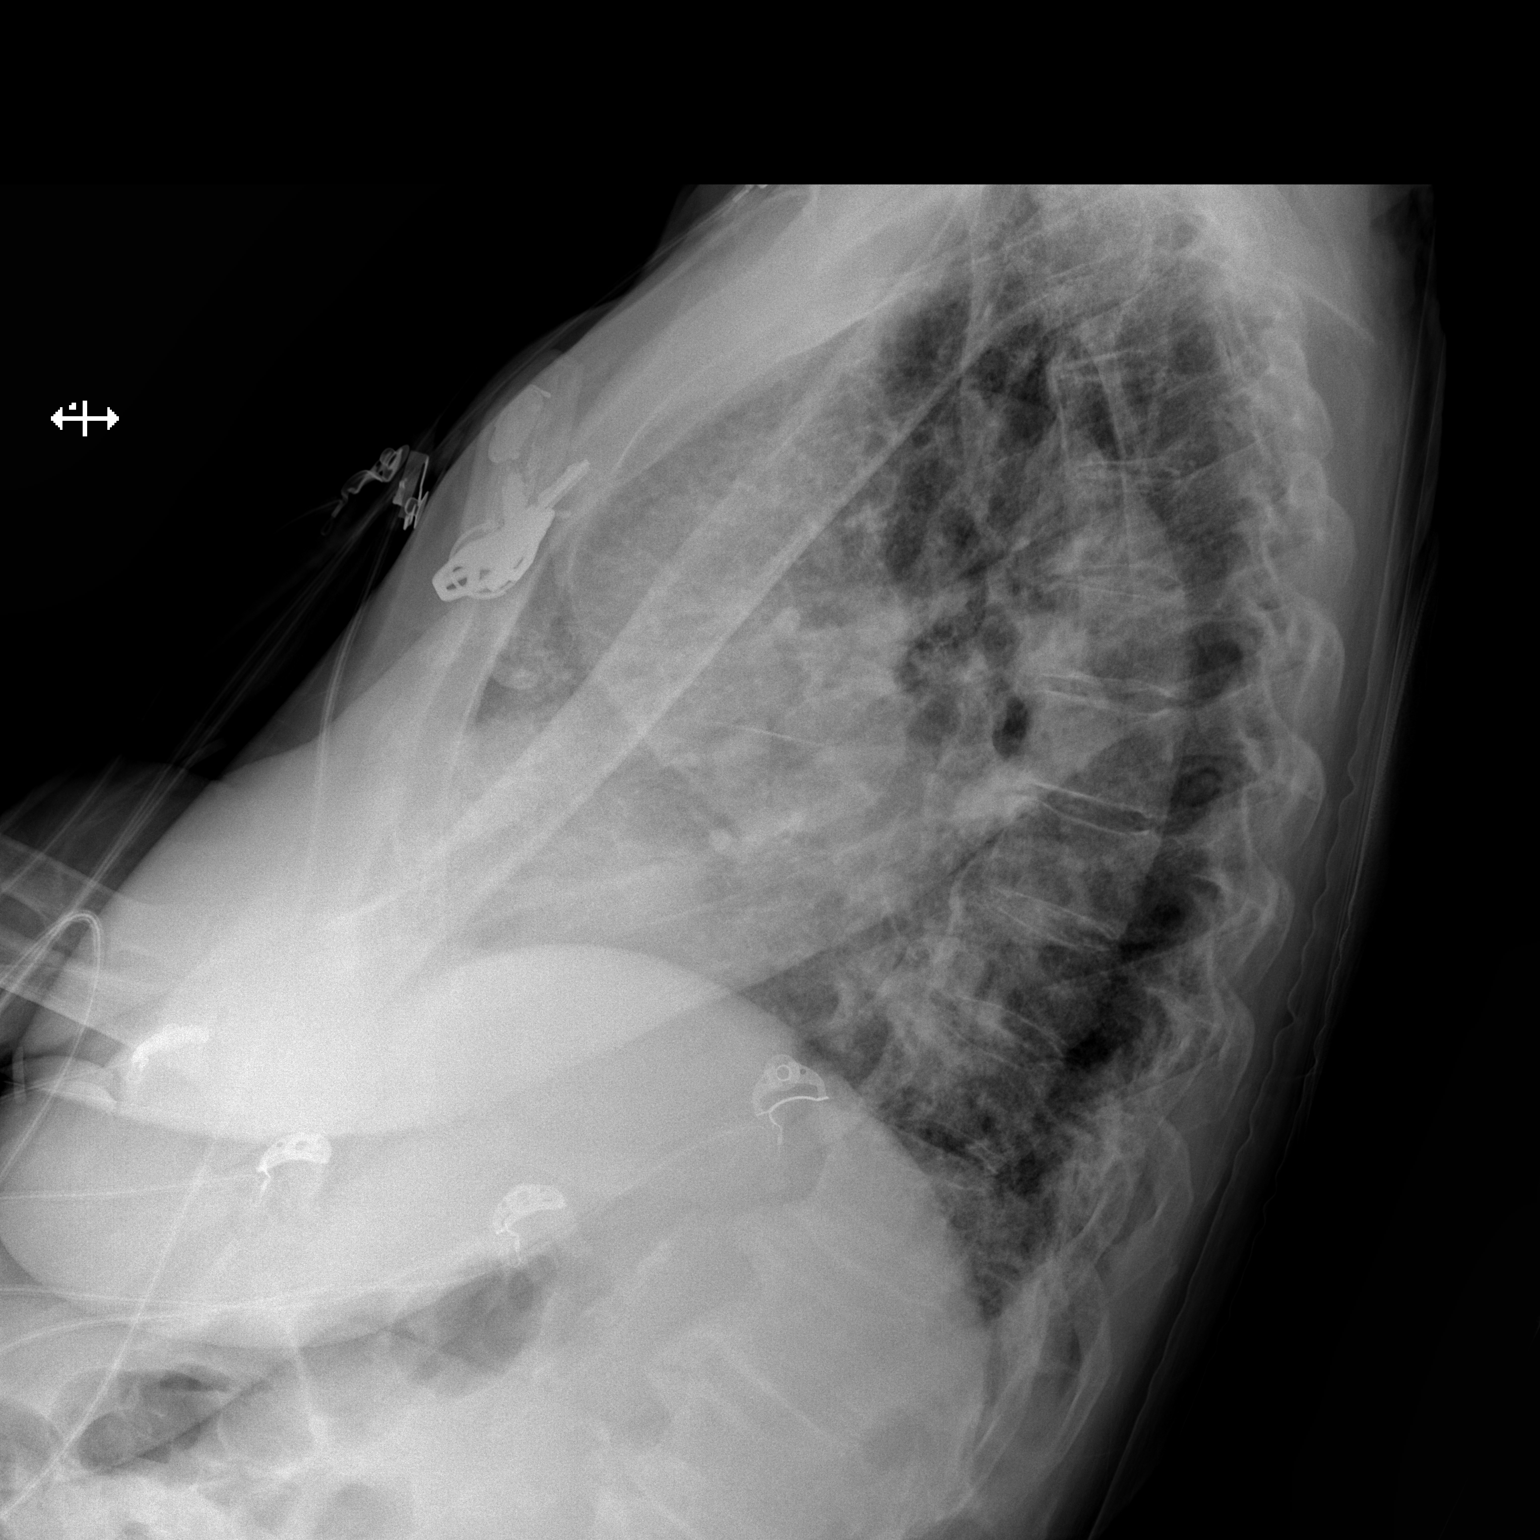

[x chest ap]
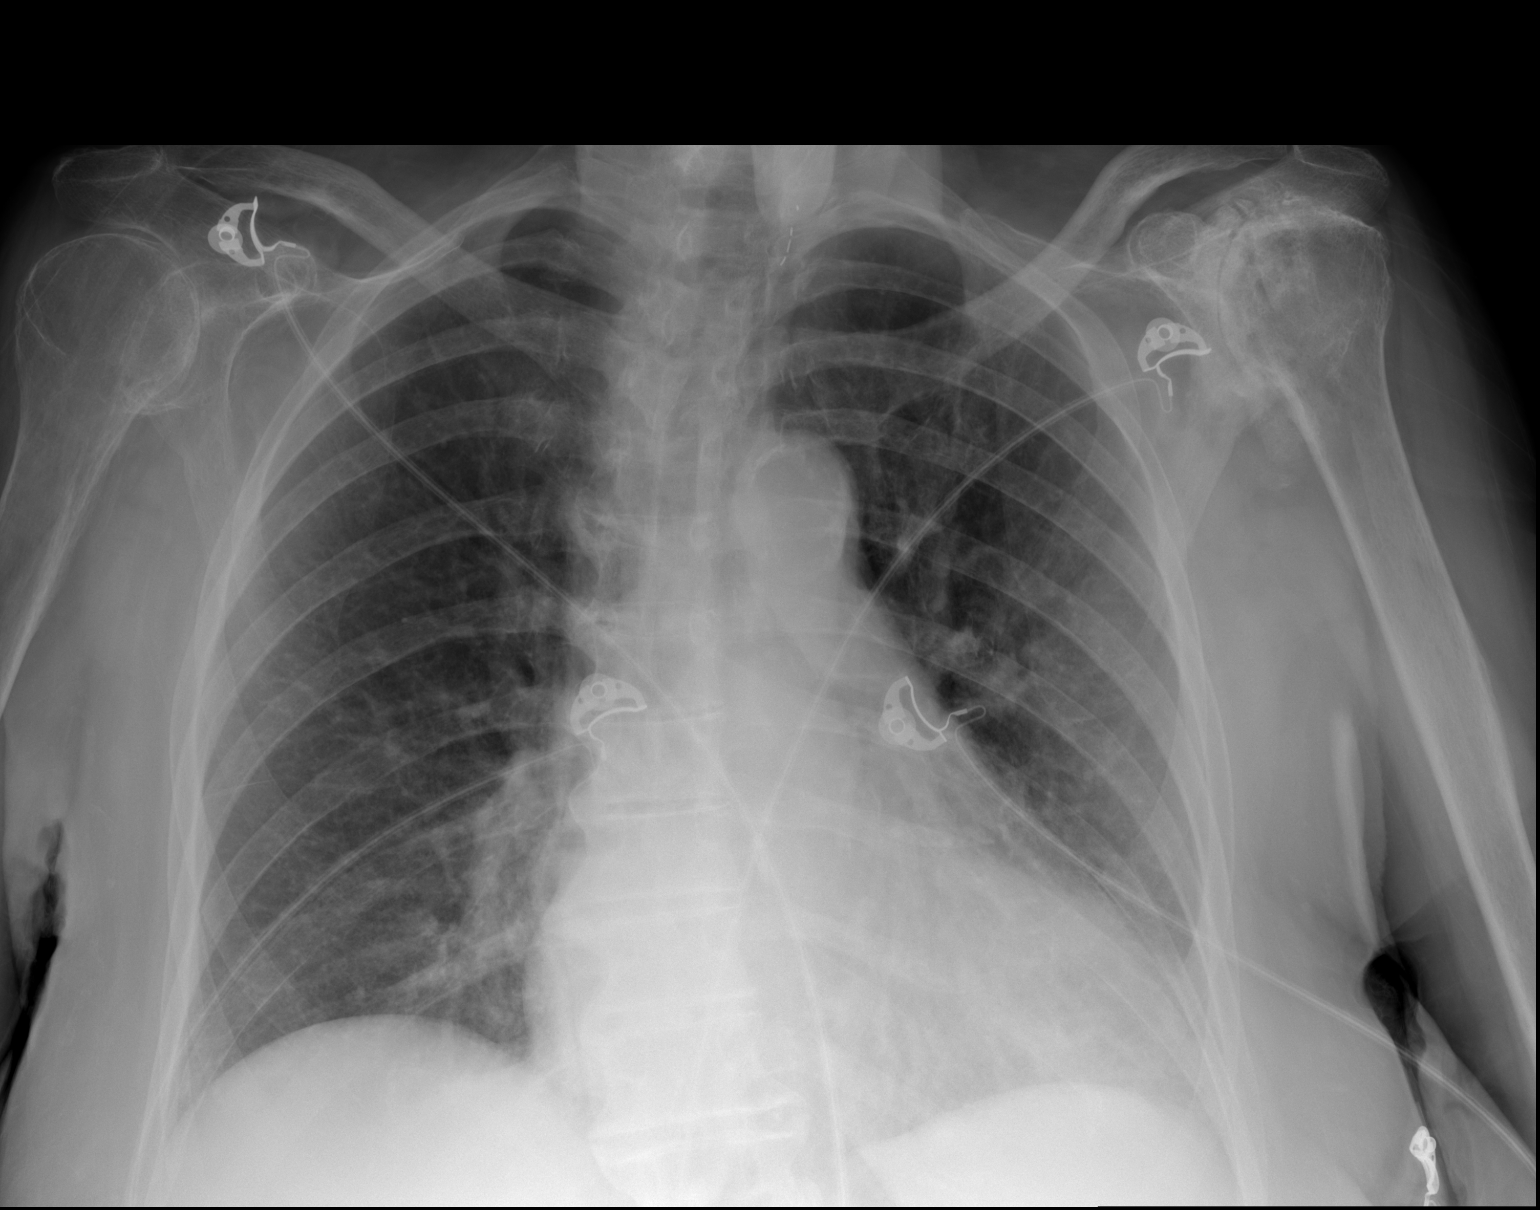

[2 of 2 positions shown; findings below may reference images not displayed]

FINDINGS: The cardiac silhouette is mildly enlarged. No mediastinal or hilar
masses. No convincing adenopathy.

Lungs are clear.  No pleural effusion.  No pneumothorax.

There are advanced arthropathic changes of the left glenohumeral
joint with marked joint space narrowing remottling of the acetabulum
and humeral head and subchondral sclerosis and osteophytes. This is
similar to the prior study. Right glenohumeral joint is relatively
well preserved. Bones are diffusely demineralized.
IMPRESSION: 1. No acute cardiopulmonary disease.
2. Advanced arthropathic changes of the left glenohumeral joint.
This is asymmetric suggesting posttraumatic osteoarthritis as a
likely etiology.

## 2018-01-13 DIAGNOSIS — M150 Primary generalized (osteo)arthritis: Secondary | ICD-10-CM | POA: Diagnosis not present

## 2018-01-13 DIAGNOSIS — G8191 Hemiplegia, unspecified affecting right dominant side: Secondary | ICD-10-CM | POA: Diagnosis not present

## 2018-01-13 DIAGNOSIS — I481 Persistent atrial fibrillation: Secondary | ICD-10-CM | POA: Diagnosis not present

## 2018-01-13 DIAGNOSIS — F0390 Unspecified dementia without behavioral disturbance: Secondary | ICD-10-CM | POA: Diagnosis not present

## 2018-01-14 ENCOUNTER — Encounter (INDEPENDENT_AMBULATORY_CARE_PROVIDER_SITE_OTHER): Payer: Medicare Other | Admitting: Ophthalmology

## 2018-01-14 DIAGNOSIS — I10 Essential (primary) hypertension: Secondary | ICD-10-CM | POA: Diagnosis not present

## 2018-01-14 DIAGNOSIS — H59031 Cystoid macular edema following cataract surgery, right eye: Secondary | ICD-10-CM | POA: Diagnosis not present

## 2018-01-14 DIAGNOSIS — H43812 Vitreous degeneration, left eye: Secondary | ICD-10-CM | POA: Diagnosis not present

## 2018-01-14 DIAGNOSIS — H35033 Hypertensive retinopathy, bilateral: Secondary | ICD-10-CM

## 2018-01-16 DIAGNOSIS — E559 Vitamin D deficiency, unspecified: Secondary | ICD-10-CM | POA: Diagnosis not present

## 2018-01-16 DIAGNOSIS — E119 Type 2 diabetes mellitus without complications: Secondary | ICD-10-CM | POA: Diagnosis not present

## 2018-01-16 DIAGNOSIS — D518 Other vitamin B12 deficiency anemias: Secondary | ICD-10-CM | POA: Diagnosis not present

## 2018-01-16 DIAGNOSIS — E038 Other specified hypothyroidism: Secondary | ICD-10-CM | POA: Diagnosis not present

## 2018-01-16 DIAGNOSIS — Z79899 Other long term (current) drug therapy: Secondary | ICD-10-CM | POA: Diagnosis not present

## 2018-01-16 DIAGNOSIS — E7849 Other hyperlipidemia: Secondary | ICD-10-CM | POA: Diagnosis not present

## 2018-02-06 DIAGNOSIS — E559 Vitamin D deficiency, unspecified: Secondary | ICD-10-CM | POA: Diagnosis not present

## 2018-02-06 DIAGNOSIS — E119 Type 2 diabetes mellitus without complications: Secondary | ICD-10-CM | POA: Diagnosis not present

## 2018-02-06 DIAGNOSIS — E038 Other specified hypothyroidism: Secondary | ICD-10-CM | POA: Diagnosis not present

## 2018-02-06 DIAGNOSIS — E7849 Other hyperlipidemia: Secondary | ICD-10-CM | POA: Diagnosis not present

## 2018-02-06 DIAGNOSIS — Z79899 Other long term (current) drug therapy: Secondary | ICD-10-CM | POA: Diagnosis not present

## 2018-02-06 DIAGNOSIS — D518 Other vitamin B12 deficiency anemias: Secondary | ICD-10-CM | POA: Diagnosis not present

## 2018-02-10 DIAGNOSIS — I10 Essential (primary) hypertension: Secondary | ICD-10-CM | POA: Diagnosis not present

## 2018-02-10 DIAGNOSIS — I482 Chronic atrial fibrillation: Secondary | ICD-10-CM | POA: Diagnosis not present

## 2018-02-10 DIAGNOSIS — E785 Hyperlipidemia, unspecified: Secondary | ICD-10-CM | POA: Diagnosis not present

## 2018-02-10 DIAGNOSIS — M150 Primary generalized (osteo)arthritis: Secondary | ICD-10-CM | POA: Diagnosis not present

## 2018-02-19 DIAGNOSIS — B351 Tinea unguium: Secondary | ICD-10-CM | POA: Diagnosis not present

## 2018-02-19 DIAGNOSIS — M2041 Other hammer toe(s) (acquired), right foot: Secondary | ICD-10-CM | POA: Diagnosis not present

## 2018-02-19 DIAGNOSIS — I739 Peripheral vascular disease, unspecified: Secondary | ICD-10-CM | POA: Diagnosis not present

## 2018-02-19 DIAGNOSIS — M79675 Pain in left toe(s): Secondary | ICD-10-CM | POA: Diagnosis not present

## 2018-03-10 DIAGNOSIS — I481 Persistent atrial fibrillation: Secondary | ICD-10-CM | POA: Diagnosis not present

## 2018-03-10 DIAGNOSIS — G8191 Hemiplegia, unspecified affecting right dominant side: Secondary | ICD-10-CM | POA: Diagnosis not present

## 2018-03-10 DIAGNOSIS — I10 Essential (primary) hypertension: Secondary | ICD-10-CM | POA: Diagnosis not present

## 2018-03-10 DIAGNOSIS — M150 Primary generalized (osteo)arthritis: Secondary | ICD-10-CM | POA: Diagnosis not present

## 2018-03-17 DIAGNOSIS — M150 Primary generalized (osteo)arthritis: Secondary | ICD-10-CM | POA: Diagnosis not present

## 2018-03-17 DIAGNOSIS — M25511 Pain in right shoulder: Secondary | ICD-10-CM | POA: Diagnosis not present

## 2018-03-30 DIAGNOSIS — E119 Type 2 diabetes mellitus without complications: Secondary | ICD-10-CM | POA: Diagnosis not present

## 2018-03-30 DIAGNOSIS — D518 Other vitamin B12 deficiency anemias: Secondary | ICD-10-CM | POA: Diagnosis not present

## 2018-03-30 DIAGNOSIS — E038 Other specified hypothyroidism: Secondary | ICD-10-CM | POA: Diagnosis not present

## 2018-03-30 NOTE — Progress Notes (Signed)
Cardiology Office Note   Date:  03/31/2018   ID:  Jillian Cantrell, DOB [birth year], MRN [id number]  PCP:  Burnard Bunting, MD  Cardiologist:   Minus Breeding, MD   Chief Complaint  Patient presents with  . Atrial Fibrillation     History of Present Illness: Jillian Cantrell is a 84 y.o. female who presents for evaluation of atrial fibrillation. She was in the hospital in January with a fall 2017. She had hematochezia. She had new onset atrial fibrillation. She was not treated with anticoagulation at that time. She did have GI evaluation and there was no source of bleeding identified.  She subsequently was found to have chronic DVT and was started on Eliquis.    Since I last saw her she has done well.  She has right hemiparesis related to her previous stroke.  However, she ambulates with a cane slowly.  He walks in her retirement home down to [employer]. The patient denies any new symptoms such as chest discomfort, neck or arm discomfort. There has been no new shortness of breath, PND or orthopnea. There have been no reported palpitations, presyncope or syncope.  Does not notice her chronic atrial fibrillation and has no problems taking her anticoagulation.  Past Medical History:  Diagnosis Date  . Atrial fibrillation with normal ventricular rate ([place]) 10/2015   Rate control, no anticoagulation secondary to GI bleed  . Cancer [facility])    thyroid cancer s/p thyroidectomy  . Hypertension   . Iatrogenic hypothyroidism   . Stroke [facility]) 2003   Chronic right-sided weakness  . Thyroid disease     Past Surgical History:  Procedure Laterality Date  . ABDOMINAL HYSTERECTOMY    . APPENDECTOMY    . CHOLECYSTECTOMY    . THYROIDECTOMY    . VARICOSE VEIN SURGERY      Current Outpatient Medications  Medication Sig Dispense Refill  . apixaban (ELIQUIS) 5 MG TABS tablet Take 1 tablet (5 mg total) by mouth 2 (two) times daily. 60 tablet 11  . atorvastatin (LIPITOR) 10 MG tablet Take 1  tablet (10 mg total) by mouth daily at 6 PM. 40 tablet 0  . bisoprolol-hydrochlorothiazide (ZIAC) 2.5-6.25 MG per tablet Take 1 tablet by mouth daily.    . brimonidine (ALPHAGAN) 0.15 % ophthalmic solution Place 1 drop into both eyes 2 (two) times daily.    . Bromfenac Sodium (BROMSITE) 0.075 % SOLN Place 1 drop into the right eye 2 (two) times daily.    . cyclobenzaprine (FLEXERIL) 5 MG tablet Take 1 tablet (5 mg total) by mouth at bedtime. 30 tablet 0  . diclofenac (FLECTOR) 1.3 % PTCH Place 1 patch onto the skin 2 (two) times daily. 20 patch 0  . diltiazem (CARDIZEM) 120 MG tablet Take 0.5 tablets (60 mg total) by mouth 2 (two) times daily.    Marland Kitchen latanoprost (XALATAN) 0.005 % ophthalmic solution Place 1 drop into both eyes at bedtime.    Marland Kitchen levothyroxine (SYNTHROID, LEVOTHROID) 150 MCG tablet Take 1 tablet (150 mcg total) by mouth daily before breakfast.    . Loteprednol Etabonate 0.5 % GEL Place 1 drop into the right eye 4 (four) times daily.     No current facility-administered medications for this visit.     Allergies:   Sulfa antibiotics and Codeine    ROS:  Please see the history of present illness.   Otherwise, review of systems are positive for none.   All other systems are reviewed and  negative.    PHYSICAL EXAM: VS:  BP 105/61   Pulse 92   Ht 5\' 7"  (1.702 m)   Wt 165 lb 4 oz (75 kg)   SpO2 97%   BMI 25.88 kg/m  , BMI Body mass index is 25.88 kg/m.  GEN:  No distress, slightly frail.  NECK:  No jugular venous distention at 90 degrees, waveform within normal limits, carotid upstroke brisk and symmetric, no bruits, no thyromegaly LUNGS:  Clear to auscultation bilaterally BACK:  No CVA tenderness CHEST:  Unremarkable HEART:  S1 and S2 within normal limits, no S3, no S4, no clicks, no rubs, no murmurs ABD:  Positive bowel sounds normal in frequency in pitch, no bruits, no rebound, no guarding, unable to assess midline mass or bruit with the patient seated. EXT:  2 plus pulses  throughout, mild right ankle edema, no cyanosis no clubbing NEURO: Right hemiparesis and aphasia   EKG:  EKG is  ordered today. Atrial fibrillation, rate 63, axis within normal limits, intervals within normal limits, poor anterior R wave progression.  Recent Labs: No results found for requested labs within last 8760 hours.    Lipid Panel No results found for: CHOL, TRIG, HDL, CHOLHDL, VLDL, LDLCALC, LDLDIRECT    Wt Readings from Last 3 Encounters:  03/31/18 165 lb 4 oz (75 kg)  07/22/17 176 lb (79.8 kg)  02/24/17 190 lb 7.6 oz (86.4 kg)      Other studies Reviewed: Additional studies/ records that were reviewed today include: None Review of the above records demonstrates:     ASSESSMENT AND PLAN:   Atrial fibrillation, controlled ventricular response:   Jillian Cantrell has a CHA2DS2 - VASc score of 6.  She tolerates anticoagulation.  No change in therapy is indicated.  GI bleed:   She has had no recurrence of this.  No change in therapy.   Edema:  This is very mild.  No change in therapy.   Current medicines are reviewed at length with the patient today.  The patient does not have concerns regarding medicines.  The following changes have been made:  no change  Labs/ tests ordered today include:  None  Orders Placed This Encounter  Procedures  . EKG 12-Lead     Disposition:   FU with me in 12 months.      Signed, Minus Breeding, MD  03/31/2018 11:55 AM    [facility]

## 2018-03-31 ENCOUNTER — Ambulatory Visit (INDEPENDENT_AMBULATORY_CARE_PROVIDER_SITE_OTHER): Payer: Medicare Other | Admitting: Cardiology

## 2018-03-31 ENCOUNTER — Encounter: Payer: Self-pay | Admitting: Cardiology

## 2018-03-31 VITALS — BP 105/61 | HR 92 | Ht 67.0 in | Wt 165.2 lb

## 2018-03-31 DIAGNOSIS — I4891 Unspecified atrial fibrillation: Secondary | ICD-10-CM

## 2018-03-31 NOTE — Patient Instructions (Signed)
Medication Instructions:  Continue current medications  If you need a refill on your cardiac medications before your next appointment, please call your pharmacy.  Labwork: None Ordered   Testing/Procedures: None Ordered  Follow-Up: Your physician wants you to follow-up in: 1 Year. You should receive a reminder letter in the mail two months in advance. If you do not receive a letter, please call our office [phone].    Thank you for choosing [facility] at [facility]!!      

## 2018-04-07 DIAGNOSIS — I10 Essential (primary) hypertension: Secondary | ICD-10-CM | POA: Diagnosis not present

## 2018-04-07 DIAGNOSIS — I482 Chronic atrial fibrillation: Secondary | ICD-10-CM | POA: Diagnosis not present

## 2018-04-07 DIAGNOSIS — F329 Major depressive disorder, single episode, unspecified: Secondary | ICD-10-CM | POA: Diagnosis not present

## 2018-04-07 DIAGNOSIS — G894 Chronic pain syndrome: Secondary | ICD-10-CM | POA: Diagnosis not present

## 2018-04-15 DIAGNOSIS — M5489 Other dorsalgia: Secondary | ICD-10-CM | POA: Diagnosis not present

## 2018-04-15 DIAGNOSIS — F0390 Unspecified dementia without behavioral disturbance: Secondary | ICD-10-CM | POA: Diagnosis not present

## 2018-04-15 DIAGNOSIS — B9689 Other specified bacterial agents as the cause of diseases classified elsewhere: Secondary | ICD-10-CM | POA: Diagnosis not present

## 2018-04-15 DIAGNOSIS — M79621 Pain in right upper arm: Secondary | ICD-10-CM | POA: Diagnosis not present

## 2018-04-15 DIAGNOSIS — S4991XA Unspecified injury of right shoulder and upper arm, initial encounter: Secondary | ICD-10-CM | POA: Diagnosis not present

## 2018-04-15 DIAGNOSIS — R0789 Other chest pain: Secondary | ICD-10-CM | POA: Diagnosis not present

## 2018-04-15 DIAGNOSIS — S299XXA Unspecified injury of thorax, initial encounter: Secondary | ICD-10-CM | POA: Diagnosis not present

## 2018-04-15 DIAGNOSIS — S59901A Unspecified injury of right elbow, initial encounter: Secondary | ICD-10-CM | POA: Diagnosis not present

## 2018-04-15 DIAGNOSIS — Z043 Encounter for examination and observation following other accident: Secondary | ICD-10-CM | POA: Diagnosis not present

## 2018-04-15 DIAGNOSIS — N3000 Acute cystitis without hematuria: Secondary | ICD-10-CM | POA: Diagnosis not present

## 2018-04-15 DIAGNOSIS — M545 Low back pain: Secondary | ICD-10-CM | POA: Diagnosis not present

## 2018-04-15 DIAGNOSIS — M25521 Pain in right elbow: Secondary | ICD-10-CM | POA: Diagnosis not present

## 2018-04-15 DIAGNOSIS — M79604 Pain in right leg: Secondary | ICD-10-CM | POA: Diagnosis not present

## 2018-04-15 DIAGNOSIS — I69351 Hemiplegia and hemiparesis following cerebral infarction affecting right dominant side: Secondary | ICD-10-CM | POA: Diagnosis not present

## 2018-04-15 DIAGNOSIS — I10 Essential (primary) hypertension: Secondary | ICD-10-CM | POA: Diagnosis not present

## 2018-04-29 ENCOUNTER — Encounter (INDEPENDENT_AMBULATORY_CARE_PROVIDER_SITE_OTHER): Payer: Medicare Other | Admitting: Ophthalmology

## 2018-04-29 DIAGNOSIS — H59031 Cystoid macular edema following cataract surgery, right eye: Secondary | ICD-10-CM | POA: Diagnosis not present

## 2018-04-29 DIAGNOSIS — H43812 Vitreous degeneration, left eye: Secondary | ICD-10-CM | POA: Diagnosis not present

## 2018-04-29 DIAGNOSIS — I10 Essential (primary) hypertension: Secondary | ICD-10-CM | POA: Diagnosis not present

## 2018-04-29 DIAGNOSIS — H35033 Hypertensive retinopathy, bilateral: Secondary | ICD-10-CM | POA: Diagnosis not present

## 2018-05-05 DIAGNOSIS — I10 Essential (primary) hypertension: Secondary | ICD-10-CM | POA: Diagnosis not present

## 2018-05-05 DIAGNOSIS — M150 Primary generalized (osteo)arthritis: Secondary | ICD-10-CM | POA: Diagnosis not present

## 2018-05-05 DIAGNOSIS — G8191 Hemiplegia, unspecified affecting right dominant side: Secondary | ICD-10-CM | POA: Diagnosis not present

## 2018-05-05 DIAGNOSIS — I482 Chronic atrial fibrillation: Secondary | ICD-10-CM | POA: Diagnosis not present

## 2018-05-08 DIAGNOSIS — D518 Other vitamin B12 deficiency anemias: Secondary | ICD-10-CM | POA: Diagnosis not present

## 2018-05-08 DIAGNOSIS — Z79899 Other long term (current) drug therapy: Secondary | ICD-10-CM | POA: Diagnosis not present

## 2018-05-08 DIAGNOSIS — E038 Other specified hypothyroidism: Secondary | ICD-10-CM | POA: Diagnosis not present

## 2018-05-08 DIAGNOSIS — E559 Vitamin D deficiency, unspecified: Secondary | ICD-10-CM | POA: Diagnosis not present

## 2018-05-08 DIAGNOSIS — E119 Type 2 diabetes mellitus without complications: Secondary | ICD-10-CM | POA: Diagnosis not present

## 2018-05-08 DIAGNOSIS — E7849 Other hyperlipidemia: Secondary | ICD-10-CM | POA: Diagnosis not present

## 2018-05-18 DIAGNOSIS — Z7982 Long term (current) use of aspirin: Secondary | ICD-10-CM | POA: Diagnosis not present

## 2018-05-18 DIAGNOSIS — Z792 Long term (current) use of antibiotics: Secondary | ICD-10-CM | POA: Diagnosis not present

## 2018-05-18 DIAGNOSIS — R4182 Altered mental status, unspecified: Secondary | ICD-10-CM | POA: Diagnosis not present

## 2018-05-18 DIAGNOSIS — Z79899 Other long term (current) drug therapy: Secondary | ICD-10-CM | POA: Diagnosis not present

## 2018-05-18 DIAGNOSIS — R4689 Other symptoms and signs involving appearance and behavior: Secondary | ICD-10-CM | POA: Diagnosis not present

## 2018-05-18 DIAGNOSIS — S0990XA Unspecified injury of head, initial encounter: Secondary | ICD-10-CM | POA: Diagnosis not present

## 2018-05-18 DIAGNOSIS — I10 Essential (primary) hypertension: Secondary | ICD-10-CM | POA: Diagnosis not present

## 2018-05-18 DIAGNOSIS — E039 Hypothyroidism, unspecified: Secondary | ICD-10-CM | POA: Diagnosis not present

## 2018-05-18 DIAGNOSIS — R52 Pain, unspecified: Secondary | ICD-10-CM | POA: Diagnosis not present

## 2018-05-18 DIAGNOSIS — Z8673 Personal history of transient ischemic attack (TIA), and cerebral infarction without residual deficits: Secondary | ICD-10-CM | POA: Diagnosis not present

## 2018-05-18 DIAGNOSIS — G459 Transient cerebral ischemic attack, unspecified: Secondary | ICD-10-CM | POA: Diagnosis not present

## 2018-05-18 DIAGNOSIS — N3000 Acute cystitis without hematuria: Secondary | ICD-10-CM | POA: Diagnosis not present

## 2018-05-18 DIAGNOSIS — S299XXA Unspecified injury of thorax, initial encounter: Secondary | ICD-10-CM | POA: Diagnosis not present

## 2018-05-18 DIAGNOSIS — R410 Disorientation, unspecified: Secondary | ICD-10-CM | POA: Diagnosis not present

## 2018-05-18 DIAGNOSIS — F0391 Unspecified dementia with behavioral disturbance: Secondary | ICD-10-CM | POA: Diagnosis not present

## 2018-05-18 DIAGNOSIS — W19XXXA Unspecified fall, initial encounter: Secondary | ICD-10-CM | POA: Diagnosis not present

## 2018-05-18 DIAGNOSIS — R079 Chest pain, unspecified: Secondary | ICD-10-CM | POA: Diagnosis not present

## 2018-05-18 DIAGNOSIS — B9689 Other specified bacterial agents as the cause of diseases classified elsewhere: Secondary | ICD-10-CM | POA: Diagnosis not present

## 2018-05-18 DIAGNOSIS — I4891 Unspecified atrial fibrillation: Secondary | ICD-10-CM | POA: Diagnosis not present

## 2018-05-18 DIAGNOSIS — F05 Delirium due to known physiological condition: Secondary | ICD-10-CM | POA: Diagnosis not present

## 2018-05-28 DIAGNOSIS — E119 Type 2 diabetes mellitus without complications: Secondary | ICD-10-CM | POA: Diagnosis not present

## 2018-05-28 DIAGNOSIS — D518 Other vitamin B12 deficiency anemias: Secondary | ICD-10-CM | POA: Diagnosis not present

## 2018-05-28 DIAGNOSIS — E038 Other specified hypothyroidism: Secondary | ICD-10-CM | POA: Diagnosis not present

## 2018-06-09 DIAGNOSIS — F330 Major depressive disorder, recurrent, mild: Secondary | ICD-10-CM | POA: Diagnosis not present

## 2018-06-09 DIAGNOSIS — I63213 Cerebral infarction due to unspecified occlusion or stenosis of bilateral vertebral arteries: Secondary | ICD-10-CM | POA: Diagnosis not present

## 2018-06-09 DIAGNOSIS — F3130 Bipolar disorder, current episode depressed, mild or moderate severity, unspecified: Secondary | ICD-10-CM | POA: Diagnosis not present

## 2018-06-09 DIAGNOSIS — I2510 Atherosclerotic heart disease of native coronary artery without angina pectoris: Secondary | ICD-10-CM | POA: Diagnosis not present

## 2018-06-11 DIAGNOSIS — M79675 Pain in left toe(s): Secondary | ICD-10-CM | POA: Diagnosis not present

## 2018-06-11 DIAGNOSIS — B351 Tinea unguium: Secondary | ICD-10-CM | POA: Diagnosis not present

## 2018-06-11 DIAGNOSIS — I739 Peripheral vascular disease, unspecified: Secondary | ICD-10-CM | POA: Diagnosis not present

## 2018-06-30 DIAGNOSIS — I10 Essential (primary) hypertension: Secondary | ICD-10-CM | POA: Diagnosis not present

## 2018-06-30 DIAGNOSIS — D518 Other vitamin B12 deficiency anemias: Secondary | ICD-10-CM | POA: Diagnosis not present

## 2018-06-30 DIAGNOSIS — G8191 Hemiplegia, unspecified affecting right dominant side: Secondary | ICD-10-CM | POA: Diagnosis not present

## 2018-06-30 DIAGNOSIS — I739 Peripheral vascular disease, unspecified: Secondary | ICD-10-CM | POA: Diagnosis not present

## 2018-06-30 DIAGNOSIS — E119 Type 2 diabetes mellitus without complications: Secondary | ICD-10-CM | POA: Diagnosis not present

## 2018-06-30 DIAGNOSIS — M150 Primary generalized (osteo)arthritis: Secondary | ICD-10-CM | POA: Diagnosis not present

## 2018-06-30 DIAGNOSIS — E038 Other specified hypothyroidism: Secondary | ICD-10-CM | POA: Diagnosis not present

## 2018-07-30 DIAGNOSIS — D518 Other vitamin B12 deficiency anemias: Secondary | ICD-10-CM | POA: Diagnosis not present

## 2018-07-30 DIAGNOSIS — E119 Type 2 diabetes mellitus without complications: Secondary | ICD-10-CM | POA: Diagnosis not present

## 2018-07-30 DIAGNOSIS — E038 Other specified hypothyroidism: Secondary | ICD-10-CM | POA: Diagnosis not present

## 2018-08-05 ENCOUNTER — Encounter (INDEPENDENT_AMBULATORY_CARE_PROVIDER_SITE_OTHER): Payer: Medicare Other | Admitting: Ophthalmology

## 2018-08-07 DIAGNOSIS — E7849 Other hyperlipidemia: Secondary | ICD-10-CM | POA: Diagnosis not present

## 2018-08-07 DIAGNOSIS — D518 Other vitamin B12 deficiency anemias: Secondary | ICD-10-CM | POA: Diagnosis not present

## 2018-08-07 DIAGNOSIS — E119 Type 2 diabetes mellitus without complications: Secondary | ICD-10-CM | POA: Diagnosis not present

## 2018-08-07 DIAGNOSIS — E038 Other specified hypothyroidism: Secondary | ICD-10-CM | POA: Diagnosis not present

## 2018-08-07 DIAGNOSIS — E559 Vitamin D deficiency, unspecified: Secondary | ICD-10-CM | POA: Diagnosis not present

## 2018-08-07 DIAGNOSIS — Z79899 Other long term (current) drug therapy: Secondary | ICD-10-CM | POA: Diagnosis not present

## 2018-08-18 DIAGNOSIS — I4811 Longstanding persistent atrial fibrillation: Secondary | ICD-10-CM | POA: Diagnosis not present

## 2018-08-18 DIAGNOSIS — I10 Essential (primary) hypertension: Secondary | ICD-10-CM | POA: Diagnosis not present

## 2018-08-18 DIAGNOSIS — N390 Urinary tract infection, site not specified: Secondary | ICD-10-CM | POA: Diagnosis not present

## 2018-08-18 DIAGNOSIS — B373 Candidiasis of vulva and vagina: Secondary | ICD-10-CM | POA: Diagnosis not present

## 2018-08-20 DIAGNOSIS — N390 Urinary tract infection, site not specified: Secondary | ICD-10-CM | POA: Diagnosis not present

## 2018-09-01 ENCOUNTER — Encounter: Payer: Self-pay | Admitting: Gynecology

## 2018-09-01 ENCOUNTER — Ambulatory Visit (INDEPENDENT_AMBULATORY_CARE_PROVIDER_SITE_OTHER): Payer: Medicare Other | Admitting: Gynecology

## 2018-09-01 VITALS — BP 124/78 | Ht 68.0 in | Wt 177.0 lb

## 2018-09-01 DIAGNOSIS — N949 Unspecified condition associated with female genital organs and menstrual cycle: Secondary | ICD-10-CM

## 2018-09-01 DIAGNOSIS — N952 Postmenopausal atrophic vaginitis: Secondary | ICD-10-CM

## 2018-09-01 MED ORDER — CLINDAMYCIN PHOSPHATE 2 % VA CREA: 1.0000 | TOPICAL_CREAM | Freq: Every day | VAGINAL | 0 refills | Status: DC

## 2018-09-01 NOTE — Patient Instructions (Signed)
Apply the prescribed vaginal cream nightly for 7 nights.  Follow-up if symptoms persist.

## 2018-09-01 NOTE — Progress Notes (Signed)
    Jillian Cantrell 03/18/1927 [id number]        84 y.o.  G2P2 new patient presents with her medical assistant who provides the history of something bulging from the vagina over the past several weeks which is uncomfortable to the patient.  Was examined by the PA and subsequently this appointment was arranged.  She was treated for UTI recently.  She was also given a Diflucan pill.  Status post hysterectomy in the past.  History significant for right-sided stroke which limits her mobility and communication.  Past medical history,surgical history, problem list, medications, allergies, family history and social history were all reviewed and documented in the EPIC chart.  Directed ROS with pertinent positives and negatives documented in the history of present illness/assessment and plan.  Exam: Jillian Cantrell assistant Vitals:   09/01/18 1028  Weight: 177 lb (80.3 kg)  Height: 5\' 8"  (1.727 m)   General appearance:  Normal Abdomen soft nontender without gross masses rebound organomegaly or hernia Pelvic external BUS vagina with atrophic changes.  No significant cystocele/rectocele or vaginal prolapse.  No significant discharge or irritative changes.  Bimanual exam without masses or tenderness.  Rectal exam is normal  Assessment/Plan:  84 y.o. G2P2 with history as above.  I am going to cover her as a low-grade bacterial vaginosis with Cleocin vaginal cream nightly x7 nights.  I do not see any evidence of significant prolapse to account for the bulging symptoms.  No significant irritation, hernia, vulvar masses palpable.  At this point we will see how she does with the Cleocin vaginal cream.  Recommend follow-up if she continues to have her symptoms.    Jillian Auerbach MD, 10:41 AM 09/01/2018

## 2018-09-02 DIAGNOSIS — D518 Other vitamin B12 deficiency anemias: Secondary | ICD-10-CM | POA: Diagnosis not present

## 2018-09-02 DIAGNOSIS — E119 Type 2 diabetes mellitus without complications: Secondary | ICD-10-CM | POA: Diagnosis not present

## 2018-09-02 DIAGNOSIS — E038 Other specified hypothyroidism: Secondary | ICD-10-CM | POA: Diagnosis not present

## 2018-09-15 DIAGNOSIS — N761 Subacute and chronic vaginitis: Secondary | ICD-10-CM | POA: Diagnosis not present

## 2018-09-15 DIAGNOSIS — I4811 Longstanding persistent atrial fibrillation: Secondary | ICD-10-CM | POA: Diagnosis not present

## 2018-09-15 DIAGNOSIS — M150 Primary generalized (osteo)arthritis: Secondary | ICD-10-CM | POA: Diagnosis not present

## 2018-09-15 DIAGNOSIS — R51 Headache: Secondary | ICD-10-CM | POA: Diagnosis not present

## 2018-09-23 DIAGNOSIS — Z6827 Body mass index (BMI) 27.0-27.9, adult: Secondary | ICD-10-CM | POA: Diagnosis not present

## 2018-09-23 DIAGNOSIS — I480 Paroxysmal atrial fibrillation: Secondary | ICD-10-CM | POA: Diagnosis not present

## 2018-09-23 DIAGNOSIS — F458 Other somatoform disorders: Secondary | ICD-10-CM | POA: Diagnosis not present

## 2018-10-02 DIAGNOSIS — D518 Other vitamin B12 deficiency anemias: Secondary | ICD-10-CM | POA: Diagnosis not present

## 2018-10-02 DIAGNOSIS — E038 Other specified hypothyroidism: Secondary | ICD-10-CM | POA: Diagnosis not present

## 2018-10-02 DIAGNOSIS — E119 Type 2 diabetes mellitus without complications: Secondary | ICD-10-CM | POA: Diagnosis not present

## 2018-11-19 ENCOUNTER — Ambulatory Visit: Payer: Medicare Other | Admitting: Gynecology

## 2018-11-26 ENCOUNTER — Encounter: Payer: Self-pay | Admitting: Gynecology

## 2018-11-26 ENCOUNTER — Ambulatory Visit: Payer: Medicare Other | Admitting: Gynecology

## 2018-11-26 ENCOUNTER — Ambulatory Visit (INDEPENDENT_AMBULATORY_CARE_PROVIDER_SITE_OTHER): Payer: Medicare Other | Admitting: Gynecology

## 2018-11-26 VITALS — BP 124/80

## 2018-11-26 DIAGNOSIS — R102 Pelvic and perineal pain: Secondary | ICD-10-CM | POA: Diagnosis not present

## 2018-11-26 NOTE — Progress Notes (Signed)
    Jillian Cantrell [birth year] [id number]        85 y.o.  G2P2 presents with her daughter complaining of vaginal bulging particularly while walking causing discomfort.  Not having issues with urination or bowel movements.  I saw her 08/2018 with a normal exam status post hysterectomy with atrophic changes.  Past medical history,surgical history, problem list, medications, allergies, family history and social history were all reviewed and documented in the EPIC chart.  Directed ROS with pertinent positives and negatives documented in the history of present illness/assessment and plan.  Exam: [name] assistant Vitals:   11/26/18 1447  BP: 124/80   General appearance:  Normal Abdomen soft nontender without masses guarding rebound Pelvic external BUS vagina with atrophic changes.  Vaginal cuff well supported.  No cystocele.  Mild rectocele at best.  Bimanual exam without masses or tenderness digital rectal exam confirms mild rectocele otherwise normal.  Assessment/Plan:  85 y.o. G2P2 with complaints of vaginal bulging particular with walking.  I discussed with the patient and her daughter she certainly may have some prolapse probable rectocele with ambulation.  No evidence of that lying down.  She does have a history of stroke.  The issue is if we can demonstrate a significant prolapse what we would do about that.  Do not feel she is a surgical candidate.  Trial of pessary was discussed.  We discussed the need for fitting and return visits at least initially to make sure everything is working out.  We also discussed that even if prolapse as long as the symptoms are tolerable nothing has to be done.  At this point they are comfortable with observation.  She will follow-up if significant change in her symptoms.    Anastasio Auerbach MD, 3:09 PM 11/26/2018

## 2018-11-26 NOTE — Patient Instructions (Signed)
Follow up as needed

## 2018-12-31 DIAGNOSIS — Z789 Other specified health status: Secondary | ICD-10-CM

## 2018-12-31 DIAGNOSIS — E876 Hypokalemia: Secondary | ICD-10-CM

## 2018-12-31 DIAGNOSIS — I482 Chronic atrial fibrillation, unspecified: Secondary | ICD-10-CM

## 2018-12-31 DIAGNOSIS — D72829 Elevated white blood cell count, unspecified: Secondary | ICD-10-CM

## 2018-12-31 DIAGNOSIS — N390 Urinary tract infection, site not specified: Secondary | ICD-10-CM | POA: Diagnosis not present

## 2018-12-31 DIAGNOSIS — R509 Fever, unspecified: Secondary | ICD-10-CM

## 2018-12-31 DIAGNOSIS — E860 Dehydration: Secondary | ICD-10-CM

## 2019-01-01 DIAGNOSIS — I482 Chronic atrial fibrillation, unspecified: Secondary | ICD-10-CM | POA: Diagnosis not present

## 2019-01-01 DIAGNOSIS — E876 Hypokalemia: Secondary | ICD-10-CM | POA: Diagnosis not present

## 2019-01-01 DIAGNOSIS — E860 Dehydration: Secondary | ICD-10-CM | POA: Diagnosis not present

## 2019-01-01 DIAGNOSIS — R509 Fever, unspecified: Secondary | ICD-10-CM | POA: Diagnosis not present

## 2019-01-02 DIAGNOSIS — I482 Chronic atrial fibrillation, unspecified: Secondary | ICD-10-CM | POA: Diagnosis not present

## 2019-01-02 DIAGNOSIS — R509 Fever, unspecified: Secondary | ICD-10-CM | POA: Diagnosis not present

## 2019-01-02 DIAGNOSIS — E860 Dehydration: Secondary | ICD-10-CM | POA: Diagnosis not present

## 2019-01-02 DIAGNOSIS — E876 Hypokalemia: Secondary | ICD-10-CM | POA: Diagnosis not present

## 2019-03-17 ENCOUNTER — Telehealth: Payer: Self-pay | Admitting: *Deleted

## 2019-03-17 NOTE — Telephone Encounter (Signed)
I spoke with Mrs. Wetherington's daughter in law [email or web address], parco stated her mother in law is in [place] home, she will call us back to schedule her office visit.

## 2019-07-22 ENCOUNTER — Encounter: Payer: Self-pay | Admitting: Gynecology

## 2019-12-15 ENCOUNTER — Encounter (HOSPITAL_COMMUNITY): Payer: Self-pay | Admitting: *Deleted

## 2019-12-15 ENCOUNTER — Emergency Department (HOSPITAL_COMMUNITY)
Admission: EM | Admit: 2019-12-15 | Discharge: 2019-12-15 | Disposition: A | Discharge status: Home / Self Care | Payer: Medicare Other | Attending: Emergency Medicine | Admitting: Emergency Medicine

## 2019-12-15 ENCOUNTER — Emergency Department (HOSPITAL_COMMUNITY): Payer: Medicare Other

## 2019-12-15 DIAGNOSIS — G9341 Metabolic encephalopathy: Secondary | ICD-10-CM | POA: Diagnosis not present

## 2019-12-15 DIAGNOSIS — Z85038 Personal history of other malignant neoplasm of large intestine: Secondary | ICD-10-CM | POA: Diagnosis not present

## 2019-12-15 DIAGNOSIS — R569 Unspecified convulsions: Secondary | ICD-10-CM

## 2019-12-15 DIAGNOSIS — E038 Other specified hypothyroidism: Secondary | ICD-10-CM | POA: Diagnosis not present

## 2019-12-15 DIAGNOSIS — Z7901 Long term (current) use of anticoagulants: Secondary | ICD-10-CM | POA: Insufficient documentation

## 2019-12-15 DIAGNOSIS — Z79899 Other long term (current) drug therapy: Secondary | ICD-10-CM | POA: Diagnosis not present

## 2019-12-15 DIAGNOSIS — I10 Essential (primary) hypertension: Secondary | ICD-10-CM | POA: Diagnosis not present

## 2019-12-15 DIAGNOSIS — R410 Disorientation, unspecified: Secondary | ICD-10-CM

## 2019-12-15 DIAGNOSIS — N3000 Acute cystitis without hematuria: Secondary | ICD-10-CM

## 2019-12-15 DIAGNOSIS — I4891 Unspecified atrial fibrillation: Secondary | ICD-10-CM | POA: Diagnosis not present

## 2019-12-15 DIAGNOSIS — R4182 Altered mental status, unspecified: Secondary | ICD-10-CM | POA: Diagnosis present

## 2019-12-15 LAB — URINALYSIS, ROUTINE W REFLEX MICROSCOPIC
Bilirubin Urine: NEGATIVE
Glucose, UA: NEGATIVE mg/dL
Hgb urine dipstick: NEGATIVE
Ketones, ur: NEGATIVE mg/dL
Nitrite: NEGATIVE
Protein, ur: NEGATIVE mg/dL
Specific Gravity, Urine: 1.021 (ref 1.005–1.030)
pH: 5 (ref 5.0–8.0)

## 2019-12-15 LAB — I-STAT CHEM 8, ED
BUN: 22 mg/dL (ref 8–23)
Calcium, Ion: 1.21 mmol/L (ref 1.15–1.40)
Chloride: 103 mmol/L (ref 98–111)
Creatinine, Ser: 0.8 mg/dL (ref 0.44–1.00)
Glucose, Bld: 122 mg/dL — ABNORMAL HIGH (ref 70–99)
HCT: 44 % (ref 36.0–46.0)
Hemoglobin: 15 g/dL (ref 12.0–15.0)
Potassium: 3.7 mmol/L (ref 3.5–5.1)
Sodium: 139 mmol/L (ref 135–145)
TCO2: 27 mmol/L (ref 22–32)

## 2019-12-15 LAB — DIFFERENTIAL
Abs Immature Granulocytes: 0.13 10*3/uL — ABNORMAL HIGH (ref 0.00–0.07)
Basophils Absolute: 0.1 10*3/uL (ref 0.0–0.1)
Basophils Relative: 1 %
Eosinophils Absolute: 0.2 10*3/uL (ref 0.0–0.5)
Eosinophils Relative: 3 %
Immature Granulocytes: 2 %
Lymphocytes Relative: 17 %
Lymphs Abs: 1.3 10*3/uL (ref 0.7–4.0)
Monocytes Absolute: 0.9 10*3/uL (ref 0.1–1.0)
Monocytes Relative: 11 %
Neutro Abs: 5.2 10*3/uL (ref 1.7–7.7)
Neutrophils Relative %: 66 %

## 2019-12-15 LAB — COMPREHENSIVE METABOLIC PANEL
ALT: 10 U/L (ref 0–44)
AST: 19 U/L (ref 15–41)
Albumin: 3.3 g/dL — ABNORMAL LOW (ref 3.5–5.0)
Alkaline Phosphatase: 109 U/L (ref 38–126)
Anion gap: 9 (ref 5–15)
BUN: 23 mg/dL (ref 8–23)
CO2: 25 mmol/L (ref 22–32)
Calcium: 9.4 mg/dL (ref 8.9–10.3)
Chloride: 103 mmol/L (ref 98–111)
Creatinine, Ser: 0.99 mg/dL (ref 0.44–1.00)
GFR calc Af Amer: 58 mL/min — ABNORMAL LOW (ref >60)
GFR calc non Af Amer: 50 mL/min — ABNORMAL LOW (ref >60)
Glucose, Bld: 143 mg/dL — ABNORMAL HIGH (ref 70–99)
Potassium: 4.2 mmol/L (ref 3.5–5.1)
Sodium: 137 mmol/L (ref 135–145)
Total Bilirubin: 0.7 mg/dL (ref 0.3–1.2)
Total Protein: 6.5 g/dL (ref 6.5–8.1)

## 2019-12-15 LAB — CBC
HCT: 47.7 % — ABNORMAL HIGH (ref 36.0–46.0)
Hemoglobin: 15.5 g/dL — ABNORMAL HIGH (ref 12.0–15.0)
MCH: 32 pg (ref 26.0–34.0)
MCHC: 32.5 g/dL (ref 30.0–36.0)
MCV: 98.4 fL (ref 80.0–100.0)
Platelets: 214 10*3/uL (ref 150–400)
RBC: 4.85 MIL/uL (ref 3.87–5.11)
RDW: 13 % (ref 11.5–15.5)
WBC: 7.9 10*3/uL (ref 4.0–10.5)
nRBC: 0 % (ref 0.0–0.2)

## 2019-12-15 LAB — ETHANOL: Alcohol, Ethyl (B): <10 mg/dL (ref <10)

## 2019-12-15 LAB — RAPID URINE DRUG SCREEN, HOSP PERFORMED
Amphetamines: NOT DETECTED
Barbiturates: NOT DETECTED
Benzodiazepines: NOT DETECTED
Cocaine: NOT DETECTED
Opiates: NOT DETECTED
Tetrahydrocannabinol: NOT DETECTED

## 2019-12-15 LAB — PROTIME-INR
INR: 1.3 — ABNORMAL HIGH (ref 0.8–1.2)
Prothrombin Time: 15.8 seconds — ABNORMAL HIGH (ref 11.4–15.2)

## 2019-12-15 LAB — APTT: aPTT: 28 seconds (ref 24–36)

## 2019-12-15 LAB — CBG MONITORING, ED: Glucose-Capillary: 116 mg/dL — ABNORMAL HIGH (ref 70–99)

## 2019-12-15 MED ORDER — SODIUM CHLORIDE 0.9 % IV BOLUS
1000.0000 mL | Freq: Once | INTRAVENOUS | Status: AC
  Administered 2019-12-15: 1000 mL via INTRAVENOUS

## 2019-12-15 MED ORDER — SODIUM CHLORIDE 0.9 % IV SOLN
1.0000 g | Freq: Once | INTRAVENOUS | Status: AC
  Administered 2019-12-15: 21:00:00 1 g via INTRAVENOUS
  Filled 2019-12-15: qty 10

## 2019-12-15 MED ORDER — APIXABAN 5 MG PO TABS
5.0000 mg | ORAL_TABLET | Freq: Two times a day (BID) | ORAL | Status: DC
  Administered 2019-12-15: 21:00:00 5 mg via ORAL
  Filled 2019-12-15: qty 1

## 2019-12-15 MED ORDER — CEPHALEXIN 500 MG PO CAPS: 500.0000 mg | ORAL_CAPSULE | Freq: Four times a day (QID) | ORAL | 0 refills | Status: AC

## 2019-12-15 NOTE — ED Notes (Signed)
The pt returned from xray  Alert  The pts daughter reports that the pt is usually confused about the date   But answers  Questions .  The daughter -in law reports that this Is the way she acts  Each time she has had a uti

## 2019-12-15 NOTE — Code Documentation (Addendum)
86 yo female coming from [place] home with complaints of sudden onset of left sided weakness and altered mental status. Pt has hx of right sided deficits from previous stroke. EMS called and activated Code Stroke.   Stroke Team met patient upon arrival. EDP cleared and labs drawn. Taken to CT. No hemorrhage noted on CT Head. Not a tPA candidate due to pt being on Eliquis - Last Dose 0900. NIHSS 10 due to baseline right facial droop, right arm weakness, right leg weakness, and expressive aphasia along with new onset of left arm and leg weakness that has resolved along with left lower partial hemianopia- See Stroke Timeline for details. Not an IR candidate due to mRS 4 - wheelchair bound.   Exam improving through CT and transport back to the room. Placed on cardiac monitor. Care Plan: Q2 hour VS/mNIHSS, MRI.

## 2019-12-15 NOTE — ED Notes (Signed)
The pts pupils are unequal  The lt pupil is smaller than the rt and does not react to light  The rt is 2.0 and reacts  The daughter-in -law reports that one of her eyes has some type problem and she sees an eye doctor for this

## 2019-12-15 NOTE — Consult Note (Signed)
Neurology Consultation  Reason for Consult: Code stroke Referring Physician: Quintella Reichert, MD  CC: Left-sided weakness  History is obtained from: EMS and nurse from [facility]  HPI: Jillian Cantrell is a 86 y.o. female with history of stroke with residual right hemiparesis, hypertension, glaucoma, atrial fibrillation on Eliquis.  Patient lives in [facility] assisted living.  Per nursing staff patient at 11 AM was on the phone in her bed and was alert and oriented x3.  At 11:15 noted to be talking normally and suddenly stopped talking and became weak all over.  EMS was called.  In route, patient was improving with both mentation and left-sided weakness.  Patient was not a TPA candidate secondary to being on Eliquis.  While in the hospital obtaining CT scan she continued to improve.  ED course  CT head shows-no acute cortically based infarct or acute intracranial hemorrhage identified.  Chronic left MCA territory encephalomalacia, chronic white matter disease, and 2 small calcified vertex meningiomas which are stable since last year  LKW: 11:15 this morning tpa given?: no, on Eliquis Premorbid modified Rankin scale (mRS): 5 NIH stroke score score: 11  Past Medical History:  Diagnosis Date  . Atrial fibrillation with normal ventricular rate ([place]) 10/2015   Rate control, no anticoagulation secondary to GI bleed  . Cancer [facility])    thyroid cancer s/p thyroidectomy  . Glaucoma   . Hypertension   . Iatrogenic hypothyroidism   . Stroke [facility]) 2003   Chronic right-sided weakness  . Thyroid disease     Family History  Problem Relation Age of Onset  . Cancer Father        leukemia   Social History:   reports that she quit smoking about 51 years ago. She has never used smokeless tobacco. She reports that she does not drink alcohol or use drugs.  Medications No current facility-administered medications for this encounter.  Current Outpatient Medications:  .  apixaban (ELIQUIS) 5 MG TABS  tablet, Take 1 tablet (5 mg total) by mouth 2 (two) times daily., Disp: 60 tablet, Rfl: 11 .  atorvastatin (LIPITOR) 10 MG tablet, Take 1 tablet (10 mg total) by mouth daily at 6 PM., Disp: 40 tablet, Rfl: 0 .  bisoprolol-hydrochlorothiazide (ZIAC) 2.5-6.25 MG per tablet, Take 1 tablet by mouth daily., Disp: , Rfl:  .  brimonidine (ALPHAGAN) 0.15 % ophthalmic solution, Place 1 drop into both eyes 2 (two) times daily., Disp: , Rfl:  .  Bromfenac Sodium (BROMSITE) 0.075 % SOLN, Place 1 drop into the right eye 2 (two) times daily., Disp: , Rfl:  .  clindamycin (CLEOCIN) 2 % vaginal cream, Place 1 Applicatorful vaginally at bedtime. (Patient not taking: Reported on 11/26/2018), Disp: 40 g, Rfl: 0 .  cyclobenzaprine (FLEXERIL) 5 MG tablet, Take 1 tablet (5 mg total) by mouth at bedtime., Disp: 30 tablet, Rfl: 0 .  diclofenac (FLECTOR) 1.3 % PTCH, Place 1 patch onto the skin 2 (two) times daily., Disp: 20 patch, Rfl: 0 .  diltiazem (CARDIZEM) 120 MG tablet, Take 0.5 tablets (60 mg total) by mouth 2 (two) times daily., Disp: , Rfl:  .  latanoprost (XALATAN) 0.005 % ophthalmic solution, Place 1 drop into both eyes at bedtime., Disp: , Rfl:  .  levothyroxine (SYNTHROID, LEVOTHROID) 150 MCG tablet, Take 1 tablet (150 mcg total) by mouth daily before breakfast., Disp: , Rfl:  .  Loteprednol Etabonate 0.5 % GEL, Place 1 drop into the right eye 4 (four) times daily., Disp: , Rfl:  ROS:  Unable to obtain due to altered mental status.    Exam: Current vital signs: BP 126/68   Pulse 89   Temp 97.8 F (36.6 C) (Oral)   Resp 17   SpO2 97%  Vital signs in last 24 hours: Temp:  [97.8 F (36.6 C)] 97.8 F (36.6 C) (03/03 1245) Pulse Rate:  [71-89] 89 (03/03 1245) Resp:  [16-17] 17 (03/03 1245) BP: (122-126)/(68-77) 126/68 (03/03 1245) SpO2:  [97 %] 97 % (03/03 1245)   Constitutional: Appears well-developed and well-nourished.  Eyes: No scleral injection HENT: No OP obstrucion Head: Normocephalic.   Cardiovascular: Palpable Respiratory: Effort normal, non-labored breathing GI: Soft.  No distension. There is no tenderness.  Skin: WDI Neuro: Mental Status: Patient is awake, alert, oriented hospital and able to name fingers such as thumb, able to follow simple commands. Speech-intact for naming, repeating, comprehension. Cranial Nerves: II: Right medial quadrantanopsia otherwise within normal limits III,IV, VI: EOMI without ptosis or diploplia. Pupils equal, round and reactive to light V: Facial sensation is symmetric to temperature VII: Shows a right facial droop from previous stroke and left facial droop which is new VIII: hearing is intact to voice X: Palat elevates symmetrically XI: Shoulder shrug is symmetric. XII: tongue is midline without atrophy or fasciculations.  Motor: Right arm and leg has contractures.  Unable to move right arm.  Able to lift right leg without drift.  Left arm antigravity with no drift.  Left leg antigravity with no drift Sensory: Sensation intact with decreased sensation to right leg with DSS Plantars: Mute bilaterally Cerebellar: FNF  intact bilaterally  Labs I have reviewed labs in epic and the results pertinent to this consultation are:   CBC    Component Value Date/Time   WBC 7.9 12/15/2019 1218   RBC 4.85 12/15/2019 1218   HGB 15.0 12/15/2019 1225   HCT 44.0 12/15/2019 1225   PLT 214 12/15/2019 1218   MCV 98.4 12/15/2019 1218   MCH 32.0 12/15/2019 1218   MCHC 32.5 12/15/2019 1218   RDW 13.0 12/15/2019 1218   LYMPHSABS 1.3 12/15/2019 1218   MONOABS 0.9 12/15/2019 1218   EOSABS 0.2 12/15/2019 1218   BASOSABS 0.1 12/15/2019 1218    CMP     Component Value Date/Time   NA 139 12/15/2019 1225   K 3.7 12/15/2019 1225   CL 103 12/15/2019 1225   CO2 25 12/15/2019 1218   GLUCOSE 122 (H) 12/15/2019 1225   BUN 22 12/15/2019 1225   CREATININE 0.80 12/15/2019 1225   CALCIUM 9.4 12/15/2019 1218   PROT 6.5 12/15/2019 1218   ALBUMIN  3.3 (L) 12/15/2019 1218   AST 19 12/15/2019 1218   ALT 10 12/15/2019 1218   ALKPHOS 109 12/15/2019 1218   BILITOT 0.7 12/15/2019 1218   GFRNONAA 50 (L) 12/15/2019 1218   GFRAA 58 (L) 12/15/2019 1218    Lipid Panel  No results found for: CHOL, TRIG, HDL, CHOLHDL, VLDL, LDLCALC, LDLDIRECT   Imaging I have reviewed the images obtained:  CT-scan of the brain-no acute cortically based infarct or acute intracranial hemorrhage identified.  Chronic left MCA territory encephalomalacia, chronic white matter disease, and 2 small calcified vertex meningiomas which are stable since last year   Etta Quill PA-C [facility] [phone]  M-F  (9:00 am- 5:00 PM)  12/15/2019, 12:54 PM     Assessment:  This is a 86 year old female presenting to the hospital as a code stroke.  Patient lives in assisted living where she was noted  to have a sudden onset of noncommunication and flaccid all over.  Patient at baseline does have right-sided weakness from previous stroke.  EMS was called and noted left-sided weakness however she improved in route.  On exam post CT patient was improving and had increased left-sided strength.  Patient is on Eliquis and was not a TPA candidate.  At this time differential includes possible TIA versus seizure with postictal state.  Impression: Acute encephalopathy Sudden alternation in awareness with generalized weakness Suspect seizure versus TIA   Recommendations: -MRI brain to evaluate for stroke. -Continue patient on Eliquis --Check urine analysis, CBC, BMP, chest x-ray --If MRI brain positive for stroke,  recommend admission. --If MRI brain negative, patient can be discharged.  Would start Keppra 250 mg twice daily of Keppra And increase to 500 mg BID after 1 week  NEUROHOSPITALIST ADDENDUM Performed a face to face diagnostic evaluation.   I have reviewed the contents of history and physical exam as documented by PA/ARNP/Resident and agree with above  documentation.  I have discussed and formulated the above plan as documented. Edits to the note have been made as needed.  86 year old female with history of left hemispheric stroke with residual right right hemiparesis and right arm contracture was in her nursing home when suddenly nursing staff noticed patient stopped talking, staring off, and became limp.  When EMS arrived, patient was nonverbal.  Her right arm was chronically contracted, left arm was flaccid.  However in route she gradually became more awake and alert and started speaking, as well as left arm was moving.  On arrival to [facility] ER, she no longer had a left arm drift.  Possible left superior quadrantanopsia.  Stat CT head was negative for acute findings.  No hemorrhage on CT  MRI brain recommended to evaluate for acute stroke, however since patient became abruptly nonverbal and limp, high suspicion she may have had a seizure.  Her seizure focus could be from her prior left MCA infarct.  Metabolic and infectious work-up pending  Consider starting patient on 250 twice daily of Keppra and increase to 500 mg twice daily after 1 week. outpatient neurology follow-up.     Karena Addison Vickye Astorino MD [facility] [phone]   If 7pm to 7am, please call on call as listed on AMION.

## 2019-12-15 NOTE — ED Notes (Signed)
Pt sent back from mri  No study doe per their personnell because she was not alert and able to answer questions    They ordered abd and chest xrays  ??  Pt returned alert reported that she was at [place]   Xray  Came and took her for  Xray  Daughter just arrived  The pts urine is very dark  And the daughter reports that they do not usually give her enough antibiotic each time she has a uti and she thinks this is one of the major problems today

## 2019-12-15 NOTE — ED Provider Notes (Signed)
[facility] EMERGENCY DEPARTMENT Provider Note   CSN: [id number] Arrival date & time: 12/15/19  1214  An emergency department physician performed an initial assessment on this suspected stroke patient at 1216.  History No chief complaint on file.   Jillian Cantrell is a 86 y.o. female.  The history is provided by the patient, medical records and the EMS personnel. No language interpreter was used.   Jillian Cantrell is a 86 y.o. female who presents to the Emergency Department complaining of code stroke.  Level V caveat due to altered mental status. She presents the emergency department by EMS from [place] facility for evaluation of altered mental status and possible stroke. She was in her routine state of health at 11 AM. At 1115 nursing staff saw her sitting up and eating and she became acutely altered with left-sided weakness and difficulty speaking. EMS reports improvement in her mental status in route to the hospital. She does take eliquis for history of atrial fibrillation.    Past Medical History:  Diagnosis Date  . Atrial fibrillation with normal ventricular rate ([place]) 10/2015   Rate control, no anticoagulation secondary to GI bleed  . Cancer [facility])    thyroid cancer s/p thyroidectomy  . Glaucoma   . Hypertension   . Iatrogenic hypothyroidism   . Stroke [facility]) 2003   Chronic right-sided weakness  . Thyroid disease     Patient Active Problem List   Diagnosis Date Noted  . Weakness 02/24/2017  . 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 UTI (lower urinary tract infection) 05/30/2016  . Atrial fibrillation with normal ventricular rate ([place])   . Thyroid disease   . Iatrogenic hypothyroidism   . Atrial fibrillation ([place])   . Hypothyroidism 11/05/2015  . Rectal bleeding   . Acute blood loss anemia 11/02/2015  . GI bleed 11/01/2015  . Fall 11/01/2015  . Physical deconditioning 11/01/2015  . Atrial fibrillation, new onset ([place]) 11/01/2015  .  Hypotension 11/01/2015  . History of CVA (cerebrovascular accident) 11/01/2015  . HLD (hyperlipidemia)     Past Surgical History:  Procedure Laterality Date  . ABDOMINAL HYSTERECTOMY    . APPENDECTOMY    . CHOLECYSTECTOMY    . THYROIDECTOMY    . VARICOSE VEIN SURGERY       OB History    Gravida  2   Para  2   Term      Preterm      AB      Living  2     SAB      TAB      Ectopic      Multiple      Live Births              Family History  Problem Relation Age of Onset  . Cancer Father        leukemia    Social History   Tobacco Use  . Smoking status: Former Smoker    Quit date: 10/14/1968    Years since quitting: 51.2  . Smokeless tobacco: Never Used  Substance Use Topics  . Alcohol use: No  . Drug use: No    Home Medications Prior to Admission medications   Medication Sig Start Date End Date Taking? Authorizing Provider  apixaban (ELIQUIS) 5 MG TABS tablet Take 1 tablet (5 mg total) by mouth 2 (two) times daily. 07/22/17   Wellington Hampshire, MD  atorvastatin (LIPITOR) 10 MG tablet Take 1 tablet (10 mg total) by  mouth daily at 6 PM. 02/25/17   Nita Sells, MD  bisoprolol-hydrochlorothiazide [facility]) 2.5-6.25 MG per tablet Take 1 tablet by mouth daily.    [provider]  brimonidine (ALPHAGAN) 0.15 % ophthalmic solution Place 1 drop into both eyes 2 (two) times daily.    [provider]  Bromfenac Sodium (BROMSITE) 0.075 % SOLN Place 1 drop into the right eye 2 (two) times daily.    [provider]  clindamycin (CLEOCIN) 2 % vaginal cream Place 1 Applicatorful vaginally at bedtime. Patient not taking: Reported on 11/26/2018 09/01/18   Fontaine, Belinda Block, MD  cyclobenzaprine (FLEXERIL) 5 MG tablet Take 1 tablet (5 mg total) by mouth at bedtime. 02/25/17   Nita Sells, MD  diclofenac (FLECTOR) 1.3 % PTCH Place 1 patch onto the skin 2 (two) times daily. 02/25/17   Nita Sells, MD  diltiazem (CARDIZEM)  120 MG tablet Take 0.5 tablets (60 mg total) by mouth 2 (two) times daily. 11/07/15   Verlee Monte, MD  latanoprost (XALATAN) 0.005 % ophthalmic solution Place 1 drop into both eyes at bedtime.    [provider]  levothyroxine (SYNTHROID, LEVOTHROID) 150 MCG tablet Take 1 tablet (150 mcg total) by mouth daily before breakfast. 11/07/15   Verlee Monte, MD  Loteprednol Etabonate 0.5 % GEL Place 1 drop into the right eye 4 (four) times daily.    [provider]    Allergies    Sulfa antibiotics and Codeine  Review of Systems   Review of Systems  All other systems reviewed and are negative.   Physical Exam Updated Vital Signs BP 122/77 (BP Location: Right Arm)   Pulse 71   Resp 16   Physical Exam Vitals and nursing note reviewed.  Constitutional:      Appearance: She is well-developed.  HENT:     Head: Normocephalic and atraumatic.  Cardiovascular:     Rate and Rhythm: Normal rate and regular rhythm.  Pulmonary:     Effort: Pulmonary effort is normal. No respiratory distress.  Abdominal:     Palpations: Abdomen is soft.     Tenderness: There is no abdominal tenderness. There is no guarding or rebound.  Musculoskeletal:        General: No swelling or tenderness.  Skin:    General: Skin is warm and dry.  Neurological:     Mental Status: She is alert.     Comments: Mild aphasia. Moderately confused. 3 to 5 strength in the right upper extremity, 4/5 strength in the right lower extremity. 4/5 strength in the left upper extremity, five out of five strength in the left lower extremity.  Psychiatric:        Behavior: Behavior normal.     ED Results / Procedures / Treatments   Labs (all labs ordered are listed, but only abnormal results are displayed) Labs Reviewed  PROTIME-INR - Abnormal; Notable for the following components:      Result Value   Prothrombin Time 15.8 (*)    INR 1.3 (*)    All other components within normal limits  CBC - Abnormal; Notable  for the following components:   Hemoglobin 15.5 (*)    HCT 47.7 (*)    All other components within normal limits  DIFFERENTIAL - Abnormal; Notable for the following components:   Abs Immature Granulocytes 0.13 (*)    All other components within normal limits  I-STAT CHEM 8, ED - Abnormal; Notable for the following components:   Glucose, Bld 122 (*)  All other components within normal limits  APTT  ETHANOL  COMPREHENSIVE METABOLIC PANEL  RAPID URINE DRUG SCREEN, HOSP PERFORMED  URINALYSIS, ROUTINE W REFLEX MICROSCOPIC    EKG None  Radiology CT HEAD CODE STROKE WO CONTRAST  Result Date: 12/15/2019 CLINICAL DATA:  Code stroke. 86 year old female with left side weakness. EXAM: CT HEAD WITHOUT CONTRAST TECHNIQUE: Contiguous axial images were obtained from the base of the skull through the vertex without intravenous contrast. COMPARISON:  Head CT 12/24/2018. Brain MRI 06/05/2005. FINDINGS: Brain: Chronic left MCA territory encephalomalacia with ex vacuo enlargement of the body and frontal horn of the left lateral ventricle is stable since 2020. Patchy contralateral right hemisphere white matter hypodensity appears stable. No cortically based acute infarct identified. No acute intracranial hemorrhage identified. No midline shift or intracranial mass effect. There are 2 chronic calcified vertex meningiomas each 10-12 mm diameter, stable since last year. Mild chronic associated mass effect but no definite associated cerebral edema. Vascular: Calcified atherosclerosis at the skull base. No suspicious intracranial vascular hyperdensity. Skull: No acute osseous abnormality identified. Sinuses/Orbits: Visualized paranasal sinuses and mastoids are stable and well pneumatized. Other: No acute orbit or scalp soft tissue findings. ASPECTS [facility] Stroke Program Early CT Score) Total score (0-10 with 10 being normal): 10 (chronic left MCA encephalomalacia). IMPRESSION: 1. No acute cortically based infarct or  acute intracranial hemorrhage identified. ASPECTS 10. 2. Chronic left MCA territory encephalomalacia, chronic white matter disease, and two small calcified vertex meningiomas are stable since last year. 3. These results were communicated to Dr. Lorraine Lax at 12:36 pm on 12/15/2019 by text page via the [facility] messaging system. Electronically Signed   By: Genevie Ann M.D.   On: 12/15/2019 12:37    Procedures Procedures (including critical care time)  Medications Ordered in ED Medications - No data to display  ED Course  I have reviewed the triage vital signs and the nursing notes.  Pertinent labs & imaging results that were available during my care of the patient were reviewed by me and considered in my medical decision making (see chart for details).    MDM Rules/Calculators/A&P                     Patient with history of atrial fibrillation on eliquis as well as CVA here for evaluation of altered mental status with aviation new left-sided weakness. Her symptoms are significantly improving, she is not a TPA candidate due to her anticoagulation. She has been evaluated by neurology. Plan to obtain MRI to evaluate for CVA.  Patient care transferred pending MRI.  Final Clinical Impression(s) / ED Diagnoses Final diagnoses:  None    Rx / DC Orders ED Discharge Orders    None       Quintella Reichert, MD 12/15/19 1554

## 2019-12-15 NOTE — ED Provider Notes (Signed)
Clinical Course as of Dec 15 2210  Wed Dec 15, 2019  1601 Patient signed out to me by Dr Ralene Bathe.  Briefly 86 yo female presenting as code stroke for AMS and left sided weakness from nursing facility.  No acute findings on CTH, chronic infarcts (chronic right sided deficits).  Pending MRI, neurology following.  Possible seizure.  Plan f/u imaging, discuss with neurology   [MT]  1822 Patient rejected at MRI because she was reportedly altered or nonverbal and needed xray to rule out implants.   On return to the ER she is verbal.  Xrays without implant.  Awaiting MRI   [MT]  1909 Daughter in law at bedside believes this symptoms are consistent with a UTI which she has had in the past.  UA with bacteria and leuks, no nitrites, but based on this hx reasonable to treat with ceftriaxone and start on keflex.  If imaging MRI is negative we'll send her back.   [MT]  2143 IMPRESSION: No acute infarction or hemorrhage. Stable chronic findings detailed above.    [MT]    Clinical Course User Index [MT] Illiana Losurdo, Carola Rhine, MD      Wyvonnia Dusky, MD 12/15/19 2212

## 2019-12-15 NOTE — ED Notes (Signed)
Pt to mri 

## 2019-12-15 NOTE — Discharge Instructions (Signed)
Prabhleen had an extensive workup in the ED today.  Her labs and imaging reports are included.    Her UA had some signs of an infection.  We are sending urine cultures.  I gave her Rocephin IV in the ER, and started her on Keflex based on her prior E. Coli UTI sensitivities from 2017.  Her daughter-in-law at the bedside believes these symptoms are consistent with a UTI.  Her stroke workup showed no acute findings.

## 2019-12-15 NOTE — ED Notes (Signed)
Mri has called and will send for her

## 2019-12-15 NOTE — ED Notes (Signed)
[facility] called for pt 

## 2019-12-15 NOTE — ED Triage Notes (Signed)
Per [facility] pt coming from [place] facility as code stroke with last known normal of 11am. Staff reports patient was sitting up and eating and then became altered along with left sided weakness and aphasia. EMS reports patient responded to pain to left arm and became more alert during transport. Patient had eliquis at [place] today.

## 2019-12-15 NOTE — ED Notes (Signed)
Pt pass ed her swallow screen 

## 2019-12-16 LAB — URINE CULTURE: Special Requests: NORMAL

## 2022-03-15 ENCOUNTER — Inpatient Hospital Stay (HOSPITAL_COMMUNITY)
Admission: EM | Admit: 2022-03-15 | Discharge: 2022-03-22 | DRG: 871 | Disposition: A | Discharge status: Hospice | Payer: Medicare Other | Attending: Internal Medicine | Admitting: Internal Medicine

## 2022-03-15 ENCOUNTER — Emergency Department (HOSPITAL_COMMUNITY): Payer: Medicare Other

## 2022-03-15 ENCOUNTER — Other Ambulatory Visit: Payer: Self-pay

## 2022-03-15 ENCOUNTER — Inpatient Hospital Stay (HOSPITAL_COMMUNITY): Payer: Medicare Other

## 2022-03-15 ENCOUNTER — Encounter (HOSPITAL_COMMUNITY): Payer: Self-pay | Admitting: Internal Medicine

## 2022-03-15 DIAGNOSIS — Z515 Encounter for palliative care: Secondary | ICD-10-CM | POA: Diagnosis not present

## 2022-03-15 DIAGNOSIS — E860 Dehydration: Secondary | ICD-10-CM | POA: Diagnosis present

## 2022-03-15 DIAGNOSIS — N179 Acute kidney failure, unspecified: Secondary | ICD-10-CM | POA: Diagnosis present

## 2022-03-15 DIAGNOSIS — Z7401 Bed confinement status: Secondary | ICD-10-CM

## 2022-03-15 DIAGNOSIS — R531 Weakness: Secondary | ICD-10-CM

## 2022-03-15 DIAGNOSIS — A4181 Sepsis due to Enterococcus: Secondary | ICD-10-CM | POA: Diagnosis present

## 2022-03-15 DIAGNOSIS — Z85850 Personal history of malignant neoplasm of thyroid: Secondary | ICD-10-CM | POA: Diagnosis not present

## 2022-03-15 DIAGNOSIS — A419 Sepsis, unspecified organism: Secondary | ICD-10-CM

## 2022-03-15 DIAGNOSIS — R4182 Altered mental status, unspecified: Secondary | ICD-10-CM | POA: Diagnosis present

## 2022-03-15 DIAGNOSIS — D72829 Elevated white blood cell count, unspecified: Secondary | ICD-10-CM | POA: Diagnosis not present

## 2022-03-15 DIAGNOSIS — E785 Hyperlipidemia, unspecified: Secondary | ICD-10-CM | POA: Diagnosis present

## 2022-03-15 DIAGNOSIS — I482 Chronic atrial fibrillation, unspecified: Secondary | ICD-10-CM | POA: Diagnosis present

## 2022-03-15 DIAGNOSIS — R627 Adult failure to thrive: Secondary | ICD-10-CM | POA: Diagnosis not present

## 2022-03-15 DIAGNOSIS — I10 Essential (primary) hypertension: Secondary | ICD-10-CM | POA: Diagnosis present

## 2022-03-15 DIAGNOSIS — Z79899 Other long term (current) drug therapy: Secondary | ICD-10-CM

## 2022-03-15 DIAGNOSIS — Z882 Allergy status to sulfonamides status: Secondary | ICD-10-CM | POA: Diagnosis not present

## 2022-03-15 DIAGNOSIS — Z7901 Long term (current) use of anticoagulants: Secondary | ICD-10-CM

## 2022-03-15 DIAGNOSIS — Z7189 Other specified counseling: Secondary | ICD-10-CM | POA: Diagnosis not present

## 2022-03-15 DIAGNOSIS — D696 Thrombocytopenia, unspecified: Secondary | ICD-10-CM | POA: Diagnosis present

## 2022-03-15 DIAGNOSIS — Z79890 Hormone replacement therapy (postmenopausal): Secondary | ICD-10-CM

## 2022-03-15 DIAGNOSIS — Z66 Do not resuscitate: Secondary | ICD-10-CM | POA: Diagnosis present

## 2022-03-15 DIAGNOSIS — E890 Postprocedural hypothyroidism: Secondary | ICD-10-CM | POA: Diagnosis present

## 2022-03-15 DIAGNOSIS — F0390 Unspecified dementia without behavioral disturbance: Secondary | ICD-10-CM | POA: Diagnosis present

## 2022-03-15 DIAGNOSIS — G9341 Metabolic encephalopathy: Secondary | ICD-10-CM | POA: Diagnosis present

## 2022-03-15 DIAGNOSIS — I69351 Hemiplegia and hemiparesis following cerebral infarction affecting right dominant side: Secondary | ICD-10-CM

## 2022-03-15 DIAGNOSIS — Z806 Family history of leukemia: Secondary | ICD-10-CM

## 2022-03-15 DIAGNOSIS — Z20822 Contact with and (suspected) exposure to covid-19: Secondary | ICD-10-CM | POA: Diagnosis present

## 2022-03-15 DIAGNOSIS — Z885 Allergy status to narcotic agent status: Secondary | ICD-10-CM | POA: Diagnosis not present

## 2022-03-15 DIAGNOSIS — G9340 Encephalopathy, unspecified: Secondary | ICD-10-CM | POA: Diagnosis not present

## 2022-03-15 DIAGNOSIS — E870 Hyperosmolality and hypernatremia: Secondary | ICD-10-CM

## 2022-03-15 DIAGNOSIS — N3000 Acute cystitis without hematuria: Secondary | ICD-10-CM | POA: Diagnosis not present

## 2022-03-15 DIAGNOSIS — I4891 Unspecified atrial fibrillation: Secondary | ICD-10-CM | POA: Diagnosis present

## 2022-03-15 DIAGNOSIS — N390 Urinary tract infection, site not specified: Secondary | ICD-10-CM | POA: Diagnosis present

## 2022-03-15 DIAGNOSIS — Z87891 Personal history of nicotine dependence: Secondary | ICD-10-CM

## 2022-03-15 LAB — CBC WITH DIFFERENTIAL/PLATELET
Abs Immature Granulocytes: 0.31 10*3/uL — ABNORMAL HIGH (ref 0.00–0.07)
Basophils Absolute: 0.1 10*3/uL (ref 0.0–0.1)
Basophils Relative: 1 %
Eosinophils Absolute: 0 10*3/uL (ref 0.0–0.5)
Eosinophils Relative: 0 %
HCT: 47.6 % — ABNORMAL HIGH (ref 36.0–46.0)
Hemoglobin: 15.6 g/dL — ABNORMAL HIGH (ref 12.0–15.0)
Immature Granulocytes: 3 %
Lymphocytes Relative: 7 %
Lymphs Abs: 0.9 10*3/uL (ref 0.7–4.0)
MCH: 31.4 pg (ref 26.0–34.0)
MCHC: 32.8 g/dL (ref 30.0–36.0)
MCV: 95.8 fL (ref 80.0–100.0)
Monocytes Absolute: 1.3 10*3/uL — ABNORMAL HIGH (ref 0.1–1.0)
Monocytes Relative: 11 %
Neutro Abs: 9 10*3/uL — ABNORMAL HIGH (ref 1.7–7.7)
Neutrophils Relative %: 78 %
Platelets: 153 10*3/uL (ref 150–400)
RBC: 4.97 MIL/uL (ref 3.87–5.11)
RDW: 16 % — ABNORMAL HIGH (ref 11.5–15.5)
WBC: 11.4 10*3/uL — ABNORMAL HIGH (ref 4.0–10.5)
nRBC: 0 % (ref 0.0–0.2)

## 2022-03-15 LAB — PROTIME-INR
INR: 1.2 (ref 0.8–1.2)
Prothrombin Time: 15.1 seconds (ref 11.4–15.2)

## 2022-03-15 LAB — CBC
HCT: 36.8 % (ref 36.0–46.0)
Hemoglobin: 11.5 g/dL — ABNORMAL LOW (ref 12.0–15.0)
MCH: 31 pg (ref 26.0–34.0)
MCHC: 31.3 g/dL (ref 30.0–36.0)
MCV: 99.2 fL (ref 80.0–100.0)
Platelets: 122 10*3/uL — ABNORMAL LOW (ref 150–400)
RBC: 3.71 MIL/uL — ABNORMAL LOW (ref 3.87–5.11)
RDW: 16.2 % — ABNORMAL HIGH (ref 11.5–15.5)
WBC: 8.7 10*3/uL (ref 4.0–10.5)
nRBC: 0 % (ref 0.0–0.2)

## 2022-03-15 LAB — URINALYSIS, ROUTINE W REFLEX MICROSCOPIC
Bacteria, UA: NONE SEEN
Bilirubin Urine: NEGATIVE
Glucose, UA: NEGATIVE mg/dL
Ketones, ur: NEGATIVE mg/dL
Nitrite: NEGATIVE
Protein, ur: 100 mg/dL — AB
RBC / HPF: >50 RBC/hpf — ABNORMAL HIGH (ref 0–5)
Specific Gravity, Urine: 1.017 (ref 1.005–1.030)
WBC, UA: >50 WBC/hpf — ABNORMAL HIGH (ref 0–5)
pH: 6 (ref 5.0–8.0)

## 2022-03-15 LAB — GLUCOSE, CAPILLARY: Glucose-Capillary: 109 mg/dL — ABNORMAL HIGH (ref 70–99)

## 2022-03-15 LAB — COMPREHENSIVE METABOLIC PANEL
ALT: 16 U/L (ref 0–44)
AST: 26 U/L (ref 15–41)
Albumin: 2.7 g/dL — ABNORMAL LOW (ref 3.5–5.0)
Alkaline Phosphatase: 178 U/L — ABNORMAL HIGH (ref 38–126)
Anion gap: 9 (ref 5–15)
BUN: 86 mg/dL — ABNORMAL HIGH (ref 8–23)
CO2: 20 mmol/L — ABNORMAL LOW (ref 22–32)
Calcium: 9.6 mg/dL (ref 8.9–10.3)
Chloride: 124 mmol/L — ABNORMAL HIGH (ref 98–111)
Creatinine, Ser: 2.11 mg/dL — ABNORMAL HIGH (ref 0.44–1.00)
GFR, Estimated: 22 mL/min — ABNORMAL LOW (ref >60)
Glucose, Bld: 149 mg/dL — ABNORMAL HIGH (ref 70–99)
Potassium: 4.2 mmol/L (ref 3.5–5.1)
Sodium: 153 mmol/L — ABNORMAL HIGH (ref 135–145)
Total Bilirubin: 1 mg/dL (ref 0.3–1.2)
Total Protein: 7 g/dL (ref 6.5–8.1)

## 2022-03-15 LAB — CREATININE, SERUM
Creatinine, Ser: 1.61 mg/dL — ABNORMAL HIGH (ref 0.44–1.00)
GFR, Estimated: 30 mL/min — ABNORMAL LOW (ref >60)

## 2022-03-15 LAB — LACTIC ACID, PLASMA
Lactic Acid, Venous: 1.5 mmol/L (ref 0.5–1.9)
Lactic Acid, Venous: 1.7 mmol/L (ref 0.5–1.9)

## 2022-03-15 LAB — SARS CORONAVIRUS 2 BY RT PCR: SARS Coronavirus 2 by RT PCR: NEGATIVE

## 2022-03-15 MED ORDER — LACTATED RINGERS IV BOLUS (SEPSIS): 1000.0000 mL | Freq: Once | INTRAVENOUS | Status: DC

## 2022-03-15 MED ORDER — ONDANSETRON HCL 4 MG/2ML IJ SOLN: 4.0000 mg | Freq: Four times a day (QID) | INTRAMUSCULAR | Status: DC | PRN

## 2022-03-15 MED ORDER — LACTATED RINGERS IV BOLUS
1000.0000 mL | Freq: Once | INTRAVENOUS | Status: AC
  Administered 2022-03-15: 1000 mL via INTRAVENOUS

## 2022-03-15 MED ORDER — METOPROLOL TARTRATE 5 MG/5ML IV SOLN
5.0000 mg | Freq: Once | INTRAVENOUS | Status: AC
  Administered 2022-03-15: 5 mg via INTRAVENOUS

## 2022-03-15 MED ORDER — ACETAMINOPHEN 325 MG PO TABS: 650.0000 mg | ORAL_TABLET | Freq: Four times a day (QID) | ORAL | Status: DC | PRN

## 2022-03-15 MED ORDER — METOPROLOL TARTRATE 5 MG/5ML IV SOLN
INTRAVENOUS | Status: AC
  Filled 2022-03-15: qty 5

## 2022-03-15 MED ORDER — VANCOMYCIN HCL 1500 MG/300ML IV SOLN
1500.0000 mg | Freq: Once | INTRAVENOUS | Status: AC
  Administered 2022-03-15: 1500 mg via INTRAVENOUS
  Filled 2022-03-15: qty 300

## 2022-03-15 MED ORDER — LACTATED RINGERS IV BOLUS (SEPSIS)
1000.0000 mL | Freq: Once | INTRAVENOUS | Status: AC
Start: 2022-03-15 — End: 2022-03-15
  Administered 2022-03-15: 1000 mL via INTRAVENOUS

## 2022-03-15 MED ORDER — SODIUM CHLORIDE 0.9 % IV SOLN
2.0000 g | INTRAVENOUS | Status: DC
  Filled 2022-03-15: qty 20

## 2022-03-15 MED ORDER — CEFEPIME HCL 2 G IV SOLR
2.0000 g | Freq: Once | INTRAVENOUS | Status: AC
  Administered 2022-03-15: 2 g via INTRAVENOUS
  Filled 2022-03-15: qty 12.5

## 2022-03-15 MED ORDER — CHLORHEXIDINE GLUCONATE CLOTH 2 % EX PADS
6.0000 | MEDICATED_PAD | Freq: Every day | CUTANEOUS | Status: DC
  Administered 2022-03-17 – 2022-03-22 (×7): 6 via TOPICAL

## 2022-03-15 MED ORDER — PIPERACILLIN-TAZOBACTAM 3.375 G IVPB 30 MIN: 3.3750 g | Freq: Once | INTRAVENOUS | Status: DC

## 2022-03-15 MED ORDER — ACETAMINOPHEN 650 MG RE SUPP: 650.0000 mg | Freq: Four times a day (QID) | RECTAL | Status: DC | PRN

## 2022-03-15 MED ORDER — VANCOMYCIN VARIABLE DOSE PER UNSTABLE RENAL FUNCTION (PHARMACIST DOSING)
Status: DC
Start: 2022-03-15 — End: 2022-03-17

## 2022-03-15 MED ORDER — LACTATED RINGERS IV BOLUS (SEPSIS)
500.0000 mL | Freq: Once | INTRAVENOUS | Status: AC
Start: 2022-03-15 — End: 2022-03-15
  Administered 2022-03-15: 500 mL via INTRAVENOUS

## 2022-03-15 MED ORDER — HEPARIN SODIUM (PORCINE) 5000 UNIT/ML IJ SOLN
5000.0000 [IU] | Freq: Three times a day (TID) | INTRAMUSCULAR | Status: DC
  Administered 2022-03-15 – 2022-03-22 (×20): 5000 [IU] via SUBCUTANEOUS
  Filled 2022-03-15 (×20): qty 1

## 2022-03-15 MED ORDER — LACTATED RINGERS IV SOLN: INTRAVENOUS | Status: DC

## 2022-03-15 MED ORDER — SODIUM CHLORIDE 0.9 % IV SOLN
2.0000 g | INTRAVENOUS | Status: DC
  Administered 2022-03-16 – 2022-03-17 (×2): 2 g via INTRAVENOUS
  Filled 2022-03-15 (×2): qty 12.5

## 2022-03-15 MED ORDER — ONDANSETRON HCL 4 MG PO TABS
4.0000 mg | ORAL_TABLET | Freq: Four times a day (QID) | ORAL | Status: DC | PRN
Start: 2022-03-15 — End: 2022-03-22

## 2022-03-15 MED ORDER — METRONIDAZOLE 500 MG/100ML IV SOLN
500.0000 mg | Freq: Two times a day (BID) | INTRAVENOUS | Status: DC
  Administered 2022-03-15 – 2022-03-18 (×6): 500 mg via INTRAVENOUS
  Filled 2022-03-15 (×7): qty 100

## 2022-03-15 NOTE — ED Provider Notes (Signed)
[facility] DEPT Provider Note   CSN: [id number] Arrival date & time: 03/15/22  1350     History  Chief Complaint  Patient presents with   Altered Mental Status   Fever    Jillian Cantrell is a 88 y.o. female.  HPI  88 year old female presents to the emergency department with concern for altered mental status and fever.  Reportedly being treated for UTI at her facility.  Daughter is at bedside and primary history giver as patient is altered.  Patient is noted to be DNR, but the daughter states the patient otherwise wants aggressive treatment.  History limited secondary to altered mental status.  Level 5 caveat.  Home Medications Prior to Admission medications   Medication Sig Start Date End Date Taking? Authorizing Provider  acetaminophen (TYLENOL) 325 MG tablet Take 650 mg by mouth every 4 (four) hours as needed for mild pain.    [provider]  apixaban (ELIQUIS) 5 MG TABS tablet Take 1 tablet (5 mg total) by mouth 2 (two) times daily. 07/22/17   Wellington Hampshire, MD  atorvastatin (LIPITOR) 10 MG tablet Take 1 tablet (10 mg total) by mouth daily at 6 PM. Patient taking differently: Take 10 mg by mouth at bedtime.  02/25/17   Nita Sells, MD  brimonidine (ALPHAGAN) 0.15 % ophthalmic solution Place 1 drop into both eyes 2 (two) times daily.    [provider]  Bromfenac Sodium (BROMSITE) 0.075 % SOLN Place 1 drop into the right eye 2 (two) times daily.    [provider]  cyclobenzaprine (FLEXERIL) 5 MG tablet Take 1 tablet (5 mg total) by mouth at bedtime. 02/25/17   Nita Sells, MD  diclofenac (FLECTOR) 1.3 % PTCH Place 1 patch onto the skin 2 (two) times daily. Patient not taking: Reported on 12/15/2019 02/25/17   Nita Sells, MD  diltiazem (CARDIZEM SR) 60 MG 12 hr capsule Take 60 mg by mouth 2 (two) times daily.    [provider]  diltiazem (CARDIZEM) 120 MG tablet Take 0.5 tablets (60 mg  total) by mouth 2 (two) times daily. Patient not taking: Reported on 12/15/2019 11/07/15   Verlee Monte, MD  divalproex (DEPAKOTE) 125 MG DR tablet Take 125 mg by mouth daily at 12 noon.    [provider]  escitalopram (LEXAPRO) 10 MG tablet Take 10 mg by mouth daily.    [provider]  latanoprost (XALATAN) 0.005 % ophthalmic solution Place 1 drop into both eyes at bedtime.    [provider]  levothyroxine (SYNTHROID) 125 MCG tablet Take 250 mcg by mouth at bedtime.    [provider]  levothyroxine (SYNTHROID, LEVOTHROID) 150 MCG tablet Take 1 tablet (150 mcg total) by mouth daily before breakfast. Patient not taking: Reported on 12/15/2019 11/07/15   Verlee Monte, MD  Loteprednol Etabonate 0.5 % GEL Place 1 drop into the right eye 4 (four) times daily.    [provider]  spironolactone (ALDACTONE) 50 MG tablet Take 50 mg by mouth daily.    [provider]      Allergies    Sulfa antibiotics and Codeine    Review of Systems   Review of Systems  Unable to perform ROS: Mental status change   Physical Exam Updated Vital Signs BP 106/79   Pulse (!) 156   Temp (!) 100.4 F (38 C) (Rectal)   Resp (!) 27   SpO2 93%  Physical Exam Vitals and nursing note reviewed.  Constitutional:  Appearance: She is ill-appearing.     Comments: Awakens to name  HENT:     Head: Normocephalic.     Mouth/Throat:     Mouth: Mucous membranes are moist.  Eyes:     Pupils: Pupils are equal, round, and reactive to light.  Cardiovascular:     Rate and Rhythm: Tachycardia present.  Pulmonary:     Effort: No respiratory distress.  Abdominal:     Palpations: Abdomen is soft.     Tenderness: There is no abdominal tenderness.  Musculoskeletal:     Cervical back: No rigidity.  Skin:    General: Skin is warm.  Neurological:     Mental Status: She is oriented to person, place, and time. Mental status is at baseline.  Psychiatric:        Mood and  Affect: Mood normal.    ED Results / Procedures / Treatments   Labs (all labs ordered are listed, but only abnormal results are displayed) Labs Reviewed  COMPREHENSIVE METABOLIC PANEL - Abnormal; Notable for the following components:      Result Value   Sodium 153 (*)    Chloride 124 (*)    CO2 20 (*)    Glucose, Bld 149 (*)    BUN 86 (*)    Creatinine, Ser 2.11 (*)    Albumin 2.7 (*)    Alkaline Phosphatase 178 (*)    GFR, Estimated 22 (*)    All other components within normal limits  CBC WITH DIFFERENTIAL/PLATELET - Abnormal; Notable for the following components:   WBC 11.4 (*)    Hemoglobin 15.6 (*)    HCT 47.6 (*)    RDW 16.0 (*)    Neutro Abs 9.0 (*)    Monocytes Absolute 1.3 (*)    Abs Immature Granulocytes 0.31 (*)    All other components within normal limits  URINALYSIS, ROUTINE W REFLEX MICROSCOPIC - Abnormal; Notable for the following components:   Color, Urine AMBER (*)    APPearance CLOUDY (*)    Hgb urine dipstick LARGE (*)    Protein, ur 100 (*)    Leukocytes,Ua LARGE (*)    RBC / HPF >50 (*)    WBC, UA >50 (*)    All other components within normal limits  CULTURE, BLOOD (ROUTINE X 2)  CULTURE, BLOOD (ROUTINE X 2)  LACTIC ACID, PLASMA  PROTIME-INR  LACTIC ACID, PLASMA    EKG None  Radiology DG Chest 2 View  Result Date: 03/15/2022 CLINICAL DATA:  Possible sepsis. EXAM: CHEST - 2 VIEW COMPARISON:  Chest x-ray dated December 15, 2019. FINDINGS: Unchanged mild cardiomegaly. Normal pulmonary vascularity. No focal consolidation, pleural effusion, or pneumothorax. No acute osseous abnormality. End-stage left glenohumeral osteoarthritis again noted. IMPRESSION: 1. No acute cardiopulmonary disease. Electronically Signed   By: Titus Dubin M.D.   On: 03/15/2022 14:44    Procedures .Critical Care Performed by: Lorelle Gibbs, DO Authorized by: Lorelle Gibbs, DO   Critical care provider statement:    Critical care time (minutes):  45   Critical care  time was exclusive of:  Separately billable procedures and treating other patients   Critical care was necessary to treat or prevent imminent or life-threatening deterioration of the following conditions:  Sepsis   Critical care was time spent personally by me on the following activities:  Development of treatment plan with patient or surrogate, discussions with consultants, evaluation of patient's response to treatment, examination of patient, ordering and review of laboratory studies, ordering  and review of radiographic studies, ordering and performing treatments and interventions, pulse oximetry, re-evaluation of patient's condition and review of old charts   I assumed direction of critical care for this patient from another provider in my specialty: no     Care discussed with: admitting provider      Medications Ordered in ED Medications  lactated ringers infusion (has no administration in time range)  lactated ringers bolus 1,000 mL (has no administration in time range)    And  lactated ringers bolus 1,000 mL (has no administration in time range)    And  lactated ringers bolus 500 mL (has no administration in time range)  cefTRIAXone (ROCEPHIN) 2 g in sodium chloride 0.9 % 100 mL IVPB (has no administration in time range)  lactated ringers bolus 1,000 mL (1,000 mLs Intravenous New Bag/Given 03/15/22 1605)    ED Course/ Medical Decision Making/ A&P                           Medical Decision Making Amount and/or Complexity of Data Reviewed Labs: ordered. Radiology: ordered.  Risk Prescription drug management.   88 year old female presents emergency department for reported fever and ongoing UTI.  Per facility paperwork is reportedly getting 1 g of Rocephin IM since 5/30.  Patient is typically conversational, alert.  Has been drowsy for the past day.  Will open her eyes and localize to the daughter, not conversational at this time but protecting her airway.  Patient is febrile on  arrival, tachycardic, atrial fibrillation.  In the initial paperwork provided there does not appear to be any oral rate control medication that the patient is on.  Blood work shows mild leukocytosis, new renal dysfunction and urinalysis reveals large amount of leukocytes and bacteria, nitrate negative.  Concern for urosepsis.  Code sepsis placed.  Patient's heart rate varies from 1 20-1 50 on the monitor, plan for IV hydration initially for rate control.  We will place a temperature Foley to obtain a urine culture and monitor fever.  We will plan for head CT given altered mental status as well as CT renal study with a new renal dysfunction/UTI.  Flu and COVID swab has been ordered.  We will escalate patient's antibiotics to IV Zosyn and plan for medical admission.  Patients evaluation and results requires admission for further treatment and care.  Spoke with hospitalist, reviewed patient's ED course and they accept admission.  Patient agrees with admission plan, offers no new complaints and is stable/unchanged at time of admit.        Final Clinical Impression(s) / ED Diagnoses Final diagnoses:  None    Rx / DC Orders ED Discharge Orders     None         Lorelle Gibbs, DO 03/15/22 1639

## 2022-03-15 NOTE — Progress Notes (Signed)
   03/15/22 2000  Assess: MEWS Score  Temp (!) 97.5 F (36.4 C)  BP 107/84  MAP (mmHg) 93  Pulse Rate (!) 128  ECG Heart Rate (!) 128  Resp 17  Level of Consciousness Unresponsive  SpO2 96 %  O2 Device Room Air  Assess: MEWS Score  MEWS Temp 0  MEWS Systolic 0  MEWS Pulse 2  MEWS RR 0  MEWS LOC 3  MEWS Score 5  MEWS Score Color Red  Assess: if the MEWS score is Yellow or Red  Were vital signs taken at a resting state? Yes  Focused Assessment No change from prior assessment  Does the patient meet 2 or more of the SIRS criteria? No  MEWS guidelines implemented *See Row Information* Yes  Treat  MEWS Interventions Other (Comment) (Patient just came to the floor; will see if patient will settle down with HR, will continue to monitor due to previous LOC and elevated heart rate from ED, and escalate)  Pain Scale PAINAD  Breathing 0  Negative Vocalization 1  Facial Expression 0  Body Language 0  Consolability 0  PAINAD Score 1  Take Vital Signs  Increase Vital Sign Frequency  Red: Q 1hr X 4 then Q 4hr X 4, if remains red, continue Q 4hrs  Escalate  MEWS: Escalate Red: discuss with charge nurse/RN and provider, consider discussing with RRT  Notify: Charge Nurse/RN  Name of Charge Nurse/RN Notified Karrie Doffing, RN  Date Charge Nurse/RN Notified 03/15/22  Time Charge Nurse/RN Notified 2021  Document  Progress note created (see row info) Yes  Assess: SIRS CRITERIA  SIRS Temperature  0  SIRS Pulse 1  SIRS Respirations  0  SIRS WBC 0  SIRS Score Sum  1

## 2022-03-15 NOTE — Progress Notes (Signed)
Patient was transferred to room 1226. Gave report to Carmina Miller., ICU RN, the nurse who will take over the patient's care for the rest of the shift. Patient continued to be unresponsive with the patient batting her eyes here and there and HR elevated anywhere from from the 110's to 130's. Nurse contacted patient's primary contact, the daughter, and gave her an update on the patient. Nurse let daughter know that the patient is now in room 1226.

## 2022-03-15 NOTE — ED Notes (Signed)
ED TO INPATIENT HANDOFF REPORT  ED Nurse Name and Phone #: Abe People   S Name/Age/Gender Jillian Cantrell 88 y.o. female Room/Bed: WA17/WA17  Code Status   Code Status: DNR  Home/SNF/Other Nursing Home Unarousable Is this baseline? No   Triage Complete: Triage complete  Chief Complaint UTI (urinary tract infection) [N39.0]  Triage Note Pt arrives via [facility] from [place] for altered mental status, fever, possible sepsis. Per EMS pt has been being treated for two days for possible UTI. Nursing staff reported that pt temp was 103 prior to arrival. It is unclear what pt's baseline is, but she is moaning to painful stimuli only during triage.    Allergies Allergies  Allergen Reactions   Sulfa Antibiotics Other (See Comments)    Reaction:  Unknown   Codeine Hives    Level of Care/Admitting Diagnosis ED Disposition     ED Disposition  Admit   Condition  --   Comment  Hospital Area: [facility] [[id number]]  Level of Care: Progressive [102]  Admit to Progressive based on following criteria: MULTISYSTEM THREATS such as stable sepsis, metabolic/electrolyte imbalance with or without encephalopathy that is responding to early treatment.  May admit patient to [facility] or [facility] if equivalent level of care is available:: Yes  Covid Evaluation: Asymptomatic - no recent exposure (last 10 days) testing not required  Diagnosis: UTI (urinary tract infection) [154008]  Admitting Physician: Aline August [[id number]]  Attending Physician: Aline August [[id number]]  Estimated length of stay: past midnight tomorrow  Certification:: I certify this patient will need inpatient services for at least 2 midnights          B Medical/Surgery History Past Medical History:  Diagnosis Date   Atrial fibrillation with normal ventricular rate ([place]) 10/2015   Rate control, no anticoagulation secondary to GI bleed   Cancer ([place])    thyroid cancer s/p  thyroidectomy   Glaucoma    Hypertension    Iatrogenic hypothyroidism    Stroke [facility]) 2003   Chronic right-sided weakness   Thyroid disease    Past Surgical History:  Procedure Laterality Date   ABDOMINAL HYSTERECTOMY     APPENDECTOMY     CHOLECYSTECTOMY     THYROIDECTOMY     VARICOSE VEIN SURGERY       A IV Location/Drains/Wounds Patient Lines/Drains/Airways Status     Active Line/Drains/Airways     Name Placement date Placement time Site Days   Peripheral IV 03/15/22 20 G 1" Posterior;Right Wrist 03/15/22  1402  Wrist  less than 1   Peripheral IV 03/15/22 20 G 1" Right Antecubital 03/15/22  1432  Antecubital  less than 1   Urethral Catheter Lauren, Medic Temperature probe 14 Fr. 03/15/22  1700  Temperature probe  less than 1            Intake/Output Last 24 hours No intake or output data in the 24 hours ending 03/15/22 1834  Labs/Imaging Results for orders placed or performed during the hospital encounter of 03/15/22 (from the past 48 hour(s))  Lactic acid, plasma     Status: None   Collection Time: 03/15/22  2:02 PM  Result Value Ref Range   Lactic Acid, Venous 1.5 0.5 - 1.9 mmol/L    Comment: Performed at [facility], [place] [address], [place], [place] [address]  Comprehensive metabolic panel     Status: Abnormal   Collection Time: 03/15/22  2:03 PM  Result Value Ref Range  Sodium 153 (H) 135 - 145 mmol/L   Potassium 4.2 3.5 - 5.1 mmol/L   Chloride 124 (H) 98 - 111 mmol/L   CO2 20 (L) 22 - 32 mmol/L   Glucose, Bld 149 (H) 70 - 99 mg/dL    Comment: Glucose reference range applies only to samples taken after fasting for at least 8 hours.   BUN 86 (H) 8 - 23 mg/dL   Creatinine, Ser 2.11 (H) 0.44 - 1.00 mg/dL   Calcium 9.6 8.9 - 10.3 mg/dL   Total Protein 7.0 6.5 - 8.1 g/dL   Albumin 2.7 (L) 3.5 - 5.0 g/dL   AST 26 15 - 41 U/L   ALT 16 0 - 44 U/L   Alkaline Phosphatase 178 (H) 38 - 126 U/L   Total Bilirubin 1.0 0.3 - 1.2 mg/dL   GFR,  Estimated 22 (L) >60 mL/min    Comment: (NOTE) Calculated using the CKD-EPI Creatinine Equation (2021)    Anion gap 9 5 - 15    Comment: Performed at [facility], [place] [address] [place], [place] [address]  CBC with Differential     Status: Abnormal   Collection Time: 03/15/22  2:03 PM  Result Value Ref Range   WBC 11.4 (H) 4.0 - 10.5 K/uL   RBC 4.97 3.87 - 5.11 MIL/uL   Hemoglobin 15.6 (H) 12.0 - 15.0 g/dL   HCT 47.6 (H) 36.0 - 46.0 %   MCV 95.8 80.0 - 100.0 fL   MCH 31.4 26.0 - 34.0 pg   MCHC 32.8 30.0 - 36.0 g/dL   RDW 16.0 (H) 11.5 - 15.5 %   Platelets 153 150 - 400 K/uL   nRBC 0.0 0.0 - 0.2 %   Neutrophils Relative % 78 %   Neutro Abs 9.0 (H) 1.7 - 7.7 K/uL   Lymphocytes Relative 7 %   Lymphs Abs 0.9 0.7 - 4.0 K/uL   Monocytes Relative 11 %   Monocytes Absolute 1.3 (H) 0.1 - 1.0 K/uL   Eosinophils Relative 0 %   Eosinophils Absolute 0.0 0.0 - 0.5 K/uL   Basophils Relative 1 %   Basophils Absolute 0.1 0.0 - 0.1 K/uL   Immature Granulocytes 3 %   Abs Immature Granulocytes 0.31 (H) 0.00 - 0.07 K/uL    Comment: Performed at [facility], [place] [address] [place], [place] [address]  Protime-INR     Status: None   Collection Time: 03/15/22  2:03 PM  Result Value Ref Range   Prothrombin Time 15.1 11.4 - 15.2 seconds   INR 1.2 0.8 - 1.2    Comment: (NOTE) INR goal varies based on device and disease states. Performed at [facility], [place] [address], [place],  [address]   Urinalysis, Routine w reflex microscopic     Status: Abnormal   Collection Time: 03/15/22  2:03 PM  Result Value Ref Range   Color, Urine AMBER (A) YELLOW    Comment: BIOCHEMICALS MAY BE AFFECTED BY COLOR   APPearance CLOUDY (A) CLEAR   Specific Gravity, Urine 1.017 1.005 - 1.030   pH 6.0 5.0 - 8.0   Glucose, UA NEGATIVE NEGATIVE mg/dL   Hgb urine dipstick LARGE (A) NEGATIVE   Bilirubin Urine NEGATIVE NEGATIVE   Ketones, ur NEGATIVE  NEGATIVE mg/dL   Protein, ur 100 (A) NEGATIVE mg/dL   Nitrite NEGATIVE NEGATIVE   Leukocytes,Ua LARGE (A) NEGATIVE   RBC / HPF >50 (H) 0 - 5 RBC/hpf   WBC, UA >  50 (H) 0 - 5 WBC/hpf   Bacteria, UA NONE SEEN NONE SEEN   Squamous Epithelial / LPF 6-10 0 - 5   Mucus PRESENT     Comment: Performed at [facility], [place] [address], [place], [place] [address]  SARS Coronavirus 2 by RT PCR (hospital order, performed in [facility] hospital lab) *cepheid single result test* Anterior Nasal Swab     Status: None   Collection Time: 03/15/22  4:45 PM   Specimen: Anterior Nasal Swab  Result Value Ref Range   SARS Coronavirus 2 by RT PCR NEGATIVE NEGATIVE    Comment: (NOTE) SARS-CoV-2 target nucleic acids are NOT DETECTED.  The SARS-CoV-2 RNA is generally detectable in upper and lower respiratory specimens during the acute phase of infection. The lowest concentration of SARS-CoV-2 viral copies this assay can detect is 250 copies / mL. A negative result does not preclude SARS-CoV-2 infection and should not be used as the sole basis for treatment or other patient management decisions.  A negative result may occur with improper specimen collection / handling, submission of specimen other than nasopharyngeal swab, presence of viral mutation(s) within the areas targeted by this assay, and inadequate number of viral copies (<250 copies / mL). A negative result must be combined with clinical observations, patient history, and epidemiological information.  Fact Sheet for Patients:   https://www.patel.info/  Fact Sheet for Healthcare Providers: https://hall.com/  This test is not yet approved or  cleared by the [place] FDA and has been authorized for detection and/or diagnosis of SARS-CoV-2 by FDA under an Emergency Use Authorization (EUA).  This EUA will remain in effect (meaning this test can be used) for the duration of the COVID-19  declaration under Section 564(b)(1) of the Act, 21 U.S.C. section 360bbb-3(b)(1), unless the authorization is terminated or revoked sooner.  Performed at [facility], [place] [address], [place], [place] [address]   Lactic acid, plasma     Status: None   Collection Time: 03/15/22  6:00 PM  Result Value Ref Range   Lactic Acid, Venous 1.7 0.5 - 1.9 mmol/L    Comment: Performed at [facility], [place] [address] [place], [place] [address]   DG Chest 2 View  Result Date: 03/15/2022 CLINICAL DATA:  Possible sepsis. EXAM: CHEST - 2 VIEW COMPARISON:  Chest x-ray dated December 15, 2019. FINDINGS: Unchanged mild cardiomegaly. Normal pulmonary vascularity. No focal consolidation, pleural effusion, or pneumothorax. No acute osseous abnormality. End-stage left glenohumeral osteoarthritis again noted. IMPRESSION: 1. No acute cardiopulmonary disease. Electronically Signed   By: Titus Dubin M.D.   On: 03/15/2022 14:44   CT Head Wo Contrast  Result Date: 03/15/2022 CLINICAL DATA:  Mental status changes. EXAM: CT HEAD WITHOUT CONTRAST TECHNIQUE: Contiguous axial images were obtained from the base of the skull through the vertex without intravenous contrast. RADIATION DOSE REDUCTION: This exam was performed according to the departmental dose-optimization program which includes automated exposure control, adjustment of the mA and/or kV according to patient size and/or use of iterative reconstruction technique. COMPARISON:  12/15/2019 FINDINGS: Brain: Calcified meningiomas about both frontal convexities are similar including on the order of 1.1 and 1.2 cm. Left frontoparietal encephalomalacia from remote infarct is not significantly changed. Ex vacuo dilatation of the lateral ventricles secondary to atrophy and encephalomalacia. Advanced cerebral and cerebellar volume loss. No hemorrhage, acute infarct, mass lesion, intra-axial, or extra-axial fluid collection. Vascular: No hyperdense  vessel or unexpected calcification. Intracranial atherosclerosis. Skull: Normal Sinuses/Orbits: Surgical  changes about both globes. Clear paranasal sinuses and mastoid air cells. Other: None. IMPRESSION: 1. No acute intracranial abnormality. 2. Remote left frontoparietal infarct. 3. Cerebral/cerebellar volume loss. 4. Similar calcified meningiomas about both frontal convexities. Electronically Signed   By: Abigail Miyamoto M.D.   On: 03/15/2022 17:39   CT Renal Stone Study  Result Date: 03/15/2022 CLINICAL DATA:  Flank pain, kidney stone suspected. Altered mental status. EXAM: CT ABDOMEN AND PELVIS WITHOUT CONTRAST TECHNIQUE: Multidetector CT imaging of the abdomen and pelvis was performed following the standard protocol without IV contrast. RADIATION DOSE REDUCTION: This exam was performed according to the departmental dose-optimization program which includes automated exposure control, adjustment of the mA and/or kV according to patient size and/or use of iterative reconstruction technique. COMPARISON:  CT examination dated May 30, 2016 FINDINGS: Lower chest: Bibasilar dependent atelectasis. Coronary artery atherosclerotic disease. Hepatobiliary: No focal liver abnormality is seen. Status post cholecystectomy. No biliary dilatation. Pancreas: Unremarkable. No pancreatic ductal dilatation or surrounding inflammatory changes. Spleen: Normal in size without focal abnormality. Adrenals/Urinary Tract: Adrenal glands are unremarkable. There are 2 large calculi in the right renal pelvis measuring up to 1.6 cm. Right ureter is normal in caliber. No ureteral stone. There is also staghorn calculus in the left renal pelvis measuring up to 2 cm. Left ureter is also normal in caliber without evidence of hydronephrosis. Mild nonspecific bilateral perinephric fat stranding. Bladder is unremarkable. Stomach/Bowel: Stomach is within normal limits. Appendix not identified. No evidence of bowel wall thickening, distention, or  inflammatory changes. Scattered colonic diverticuli without evidence of acute diverticulitis. Vascular/Lymphatic: Aortic atherosclerosis. No enlarged abdominal or pelvic lymph nodes. Reproductive: Status post hysterectomy. No adnexal masses. Other: No abdominal wall hernia or abnormality. No abdominopelvic ascites. Musculoskeletal: Osteopenia and degenerate disc disease of the lumbar spine. No acute osseous abnormality. Generalized muscle atrophy. IMPRESSION: 1. Nephrolithiasis, with bilateral renal pelvis staghorn calculi without evidence of hydronephrosis or ureteral calculus. 2.  Urinary bladder is unremarkable. 3. Scattered colonic diverticuli without evidence of acute diverticulitis. Bowel loops are normal in caliber. 3. Additional chronic findings as above. No CT evidence of acute abdominal/pelvic process. Electronically Signed   By: Keane Police D.O.   On: 03/15/2022 16:57    Pending Labs Unresulted Labs (From admission, onward)     Start     Ordered   03/16/22 0500  CBC  Tomorrow morning,   R        03/15/22 1657   03/16/22 0500  Comprehensive metabolic panel  Tomorrow morning,   R        03/15/22 1657   03/16/22 0500  Magnesium  Tomorrow morning,   R        03/15/22 1657   03/16/22 0500  Vancomycin, random  Once-Timed,   TIMED        03/15/22 1755   03/15/22 1655  CBC  (heparin)  Once,   R       Comments: Baseline for heparin therapy IF NOT ALREADY DRAWN.  Notify MD if PLT < 100 K.    03/15/22 1657   03/15/22 1655  Creatinine, serum  (heparin)  Once,   R       Comments: Baseline for heparin therapy IF NOT ALREADY DRAWN.    03/15/22 1657   03/15/22 1638  Urine Culture  Once,   URGENT       Question:  Indication  Answer:  Dysuria   03/15/22 1637   03/15/22 1403  Culture, blood (Routine x 2)  BLOOD CULTURE X 2,   R (with STAT occurrences)      03/15/22 1402   Pending  Comprehensive metabolic panel  Once,   R        Pending   Pending  Lactic acid, plasma  Now then every 2 hours,   R       Pending   Pending  CBC with Differential  Once,   R        Pending   Pending  Protime-INR  Once,   R        Pending   Pending  Culture, blood (Routine x 2)  BLOOD CULTURE X 2,   R      Pending   Pending  Urinalysis, Routine w reflex microscopic  Once,   R        Pending            Vitals/Pain Today's Vitals   03/15/22 1615 03/15/22 1715 03/15/22 1730 03/15/22 1739  BP: 105/75 117/75 108/83   Pulse: (!) 138 (!) 148 (!) 139   Resp: (!) 25 (!) 22 (!) 25   Temp:  98.4 F (36.9 C)    TempSrc:      SpO2: 90% 92% 95%   Weight:    180 lb (81.6 kg)  Height:    '5\' 8"'$  (1.727 m)    Isolation Precautions No active isolations  Medications Medications  lactated ringers infusion (has no administration in time range)  heparin injection 5,000 Units (has no administration in time range)  acetaminophen (TYLENOL) tablet 650 mg (has no administration in time range)    Or  acetaminophen (TYLENOL) suppository 650 mg (has no administration in time range)  ondansetron (ZOFRAN) tablet 4 mg (has no administration in time range)    Or  ondansetron (ZOFRAN) injection 4 mg (has no administration in time range)  metroNIDAZOLE (FLAGYL) IVPB 500 mg (500 mg Intravenous New Bag/Given 03/15/22 1826)  vancomycin (VANCOREADY) IVPB 1500 mg/300 mL (has no administration in time range)  ceFEPIme (MAXIPIME) 2 g in sodium chloride 0.9 % 100 mL IVPB (has no administration in time range)  lactated ringers bolus 1,000 mL (1,000 mLs Intravenous New Bag/Given 03/15/22 1605)  lactated ringers bolus 1,000 mL (1,000 mLs Intravenous New Bag/Given 03/15/22 1640)    And  lactated ringers bolus 500 mL (500 mLs Intravenous New Bag/Given 03/15/22 1646)  ceFEPIme (MAXIPIME) 2 g in sodium chloride 0.9 % 100 mL IVPB (2 g Intravenous New Bag/Given 03/15/22 1745)    Mobility non-ambulatory     Focused Assessments Neuro Assessment Handoff:  Swallow screen pass? No  Cardiac Rhythm: Atrial fibrillation       Neuro  Assessment:   Neuro Checks:      Last Documented NIHSS Modified Score:   Has TPA been given? No If patient is a Neuro Trauma and patient is going to OR before floor call report to [place] nurse: [phone] or [phone]   R Recommendations: See Admitting Provider Note  Report given to:   Additional Notes:

## 2022-03-15 NOTE — ED Triage Notes (Signed)
Pt arrives via [facility] from [facility] for altered mental status, fever, possible sepsis. Per EMS pt has been being treated for two days for possible UTI. Nursing staff reported that pt temp was 103 prior to arrival. It is unclear what pt's baseline is, but she is moaning to painful stimuli only during triage.

## 2022-03-15 NOTE — H&P (Signed)
History and Physical    Jillian Cantrell:[id number] DOB: 1927-10-13 DOA: 03/15/2022  PCP: System, Provider Not In   Patient coming from: SNF  I have personally briefly reviewed patient's old medical records in [place]  Chief Complaint: Altered mental status  HPI: Jillian Cantrell is a 88 y.o. female with medical history significant of dementia currently residing in a facility, mostly bedbound, recurrent UTIs, chronic A-fib, not on anticoagulation because of history of GI bleed, hypertension, CVA, thyroid cancer status post thyroidectomy, hypothyroidism, hyperlipidemia who was sent from SNF for altered mental status and fever.  Patient is currently nonverbal and only moans to painful stimuli and hence other history cannot be obtained.  I have taken some history from the daughter at bedside and have discussed with the ED provider myself.  I have reviewed patient's medical records myself as well.  Patient was reportedly being treated for UTI at her facility since 03/12/2022 with intramuscular Rocephin.  Patient apparently has been more drowsy over the last day.  Reportedly, patient is slightly more alert and is able to eat normally.  ED Course: Patient was febrile, tachycardic and almost nonverbal.  She was found to have UTI, mild leukocytosis, hypernatremia.  She was started on IV fluids and antibiotics.  Chest x-ray was negative for infiltrates. Hospitalist service was called to evaluate the patient.  Review of Systems: As per HPI otherwise all other systems were reviewed and are negative.   Past Medical History:  Diagnosis Date   Atrial fibrillation with normal ventricular rate ([place]) 10/2015   Rate control, no anticoagulation secondary to GI bleed   Cancer ([place])    thyroid cancer s/p thyroidectomy   Glaucoma    Hypertension    Iatrogenic hypothyroidism    Stroke [facility]) 2003   Chronic right-sided weakness   Thyroid disease     Past Surgical History:  Procedure Laterality  Date   ABDOMINAL HYSTERECTOMY     APPENDECTOMY     CHOLECYSTECTOMY     THYROIDECTOMY     VARICOSE VEIN SURGERY       reports that she quit smoking about 53 years ago. She has never used smokeless tobacco. She reports that she does not drink alcohol and does not use drugs.  Allergies  Allergen Reactions   Sulfa Antibiotics Other (See Comments)    Reaction:  Unknown   Codeine Hives    Family History  Problem Relation Age of Onset   Cancer Father        leukemia    Prior to Admission medications   Medication Sig Start Date End Date Taking? Authorizing Provider  acetaminophen (TYLENOL) 325 MG tablet Take 650 mg by mouth every 4 (four) hours as needed for mild pain.    [provider]  apixaban (ELIQUIS) 5 MG TABS tablet Take 1 tablet (5 mg total) by mouth 2 (two) times daily. 07/22/17   Wellington Hampshire, MD  atorvastatin (LIPITOR) 10 MG tablet Take 1 tablet (10 mg total) by mouth daily at 6 PM. Patient taking differently: Take 10 mg by mouth at bedtime.  02/25/17   Nita Sells, MD  brimonidine (ALPHAGAN) 0.15 % ophthalmic solution Place 1 drop into both eyes 2 (two) times daily.    [provider]  Bromfenac Sodium (BROMSITE) 0.075 % SOLN Place 1 drop into the right eye 2 (two) times daily.    [provider]  cyclobenzaprine (FLEXERIL) 5 MG tablet Take 1 tablet (5 mg total) by mouth at bedtime.  02/25/17   Nita Sells, MD  diclofenac (FLECTOR) 1.3 % PTCH Place 1 patch onto the skin 2 (two) times daily. Patient not taking: Reported on 12/15/2019 02/25/17   Nita Sells, MD  diltiazem (CARDIZEM SR) 60 MG 12 hr capsule Take 60 mg by mouth 2 (two) times daily.    [provider]  diltiazem (CARDIZEM) 120 MG tablet Take 0.5 tablets (60 mg total) by mouth 2 (two) times daily. Patient not taking: Reported on 12/15/2019 11/07/15   Verlee Monte, MD  divalproex (DEPAKOTE) 125 MG DR tablet Take 125 mg by mouth daily at 12 noon.    [provider]  escitalopram (LEXAPRO) 10 MG tablet Take 10 mg by mouth daily.    [provider]  latanoprost (XALATAN) 0.005 % ophthalmic solution Place 1 drop into both eyes at bedtime.    [provider]  levothyroxine (SYNTHROID) 125 MCG tablet Take 250 mcg by mouth at bedtime.    [provider]  levothyroxine (SYNTHROID, LEVOTHROID) 150 MCG tablet Take 1 tablet (150 mcg total) by mouth daily before breakfast. Patient not taking: Reported on 12/15/2019 11/07/15   Verlee Monte, MD  Loteprednol Etabonate 0.5 % GEL Place 1 drop into the right eye 4 (four) times daily.    [provider]  spironolactone (ALDACTONE) 50 MG tablet Take 50 mg by mouth daily.    [provider]    Physical Exam: Vitals:   03/15/22 1530 03/15/22 1532 03/15/22 1600 03/15/22 1615  BP: 101/65 101/65 106/79 105/75  Pulse: (!) 119 (!) 151 (!) 156 (!) 138  Resp: (!) 28 18 (!) 27 (!) 25  Temp:      TempSrc:      SpO2: 92% 91% 93% 90%    Constitutional: Looks chronically ill and deconditioned.  Very thinly built.  Elderly female lying in bed.  Currently on room air.  No acute distress.   Vitals:   03/15/22 1530 03/15/22 1532 03/15/22 1600 03/15/22 1615  BP: 101/65 101/65 106/79 105/75  Pulse: (!) 119 (!) 151 (!) 156 (!) 138  Resp: (!) 28 18 (!) 27 (!) 25  Temp:      TempSrc:      SpO2: 92% 91% 93% 90%   Eyes: PERRL, lids and conjunctivae normal ENMT: Mucous membranes are dry.  Posterior pharynx clear of any exudate or lesions. Neck: normal, supple, no masses, no thyromegaly Respiratory: bilateral decreased breath sounds at bases with some crackles.  Intermittently tachypneic.  No accessory muscle use.  Cardiovascular: S1 S2 positive, tachycardic.  No extremity edema. 2+ pedal pulses.  Abdomen: no tenderness, no masses palpated. No hepatosplenomegaly. Bowel sounds positive.  Musculoskeletal: no clubbing / cyanosis. No joint deformity upper and lower extremities.   Skin: no rashes, lesions, ulcers. No induration Neurologic: No obvious focal neurological deficits.  Moving and moves extremities slightly to painful stimuli.   Psychiatric: Could not be assessed because of mental status.  Labs on Admission: I have personally reviewed following labs and imaging studies  CBC: Recent Labs  Lab 03/15/22 1403  WBC 11.4*  NEUTROABS 9.0*  HGB 15.6*  HCT 47.6*  MCV 95.8  PLT 725   Basic Metabolic Panel: Recent Labs  Lab 03/15/22 1403  NA 153*  K 4.2  CL 124*  CO2 20*  GLUCOSE 149*  BUN 86*  CREATININE 2.11*  CALCIUM 9.6   GFR: CrCl cannot be calculated (Unknown ideal weight.). Liver Function Tests: Recent Labs  Lab 03/15/22 1403  AST 26  ALT 16  ALKPHOS 178*  BILITOT 1.0  PROT 7.0  ALBUMIN 2.7*   No results for input(s): LIPASE, AMYLASE in the last 168 hours. No results for input(s): AMMONIA in the last 168 hours. Coagulation Profile: Recent Labs  Lab 03/15/22 1403  INR 1.2   Cardiac Enzymes: No results for input(s): CKTOTAL, CKMB, CKMBINDEX, TROPONINI in the last 168 hours. BNP (last 3 results) No results for input(s): PROBNP in the last 8760 hours. HbA1C: No results for input(s): HGBA1C in the last 72 hours. CBG: No results for input(s): GLUCAP in the last 168 hours. Lipid Profile: No results for input(s): CHOL, HDL, LDLCALC, TRIG, CHOLHDL, LDLDIRECT in the last 72 hours. Thyroid Function Tests: No results for input(s): TSH, T4TOTAL, FREET4, T3FREE, THYROIDAB in the last 72 hours. Anemia Panel: No results for input(s): VITAMINB12, FOLATE, FERRITIN, TIBC, IRON, RETICCTPCT in the last 72 hours. Urine analysis:    Component Value Date/Time   COLORURINE AMBER (A) 03/15/2022 1403   APPEARANCEUR CLOUDY (A) 03/15/2022 1403   LABSPEC 1.017 03/15/2022 1403   PHURINE 6.0 03/15/2022 1403   GLUCOSEU NEGATIVE 03/15/2022 1403   HGBUR LARGE (A) 03/15/2022 1403   BILIRUBINUR NEGATIVE 03/15/2022 1403   KETONESUR NEGATIVE  03/15/2022 1403   PROTEINUR 100 (A) 03/15/2022 1403   NITRITE NEGATIVE 03/15/2022 1403   LEUKOCYTESUR LARGE (A) 03/15/2022 1403    Radiological Exams on Admission: Chest x-ray; reviewed by myself.  No signs of infiltrates.  Assessment/Plan  Sepsis: Present on admission UTI -Presented with worsened mental status, fever, tachycardia, tachypnea, leukocytosis and possible UTI.  Patient was on IM Rocephin for the last few days at [facility]. -Currently very tachycardic.  Continue IV fluid bolus and subsequently started maintenance IV fluids at 150 cc an hour.  Follow cultures.  Check COVID and influenza -Cefepime and Flagyl along with vancomycin for now.  Acute metabolic encephalopathy History of dementia, mostly bedbound -Patient normally is bedbound, not very responsive but normally eats okay as per the patient's daughter at bedside. -She has been more drowsy over the last few days.  Monitor mental status.  CT of the head without contrast. -Fall precautions. -Palliative care consultation for goals of Care discussion -If condition does not improve in the next 24 to 48 hours, patient will benefit from hospice/comfort measures  Chronic A-fib with RVR -Heart rate going up to the 160s.  Not on rate controlling agent or anticoagulation as an outpatient.  Continue IV fluid hydration.  If rates do not improve, we will have to start amiodarone.  Leukocytosis -Mild.  Monitor  Hypernatremia Dehydration - mild.  Possibly from dehydration.  IV fluids.  Repeat a.m. labs.  AKI -creatinine 2.11.  Possibly prerenal.  Repeat a.m. labs.  IV fluids.    Hypertension -Blood pressure on the lower side.  Monitor.  Antihypertensives on hold    DVT prophylaxis: Heparin Code Status: DNR Family Communication: Daughter at bedside Disposition Plan: SNF in 2 to 3 days if clinically improves Consults called: Palliative care Admission status: Inpatient/progressive  Severity of Illness: The appropriate  patient status for this patient is INPATIENT. Inpatient status is judged to be reasonable and necessary in order to provide the required intensity of service to ensure the patient's safety. The patient's presenting symptoms, physical exam findings, and initial radiographic and laboratory data in the context of their chronic comorbidities is felt to place them at high risk for further clinical deterioration. Furthermore, it is not anticipated that the patient will be medically stable for discharge from  the hospital within 2 midnights of admission.   * I certify that at the point of admission it is my clinical judgment that the patient will require inpatient hospital care spanning beyond 2 midnights from the point of admission due to high intensity of service, high risk for further deterioration and high frequency of surveillance required.Aline August MD [facility]  03/15/2022, 5:00 PM

## 2022-03-15 NOTE — Sepsis Progress Note (Signed)
eLink is following this Code Sepsis. °

## 2022-03-15 NOTE — Progress Notes (Signed)
   03/15/22 2126  Assess: MEWS Score  Temp 97.7 F (36.5 C)  BP 97/68  MAP (mmHg) 79  Pulse Rate (!) 110  ECG Heart Rate (!) 132  Resp 18  Level of Consciousness Unresponsive  SpO2 95 %  O2 Device Room Air  Assess: MEWS Score  MEWS Temp 0  MEWS Systolic 1  MEWS Pulse 3  MEWS RR 0  MEWS LOC 3  MEWS Score 7  MEWS Score Color Red  Assess: if the MEWS score is Yellow or Red  Were vital signs taken at a resting state? Yes  Focused Assessment No change from prior assessment  Does the patient meet 2 or more of the SIRS criteria? No  MEWS guidelines implemented *See Row Information* Yes  Treat  MEWS Interventions Escalated (See documentation below);Other (Comment)  Pain Scale PAINAD  Breathing 0  Negative Vocalization 1  Facial Expression 0  Body Language 0  Consolability 0  PAINAD Score 1  Take Vital Signs  Increase Vital Sign Frequency  Red: Q 1hr X 4 then Q 4hr X 4, if remains red, continue Q 4hrs  Escalate  MEWS: Escalate Red: discuss with charge nurse/RN and provider, consider discussing with RRT  Notify: Charge Nurse/RN  Name of Charge Nurse/RN Notified Karrie Doffing, RN  Date Charge Nurse/RN Notified 03/15/22  Time Charge Nurse/RN Notified 2149  Notify: Provider  Provider Name/Title Thalia Party (on call provider)  Date Provider Notified 03/15/22  Time Provider Notified 2201  Method of Notification Page  Notification Reason Change in status  Provider response See new orders;At bedside  Date of Provider Response 03/15/22  Time of Provider Response 2203  Notify: Rapid Response  Name of Rapid Response RN Notified Lorretta Harp, RN (Rapid Response RN)  Date Rapid Response Notified 03/15/22  Time Rapid Response Notified 2149  Document  Patient Outcome Transferred/level of care increased  Progress note created (see row info) Yes  Assess: SIRS CRITERIA  SIRS Temperature  0  SIRS Pulse 1  SIRS Respirations  0  SIRS WBC 0  SIRS Score Sum  1   Patient will be  transferred down to ICU for close monitoring due to elevated HR and level of consciousness.

## 2022-03-15 NOTE — ED Notes (Signed)
Dr. Maryan Rued notified of possible Code Sepsis

## 2022-03-15 NOTE — Progress Notes (Signed)
Pharmacy Antibiotic Note  Jillian Cantrell is a 88 y.o. female admitted on 03/15/2022 with altered mental status and fever, concern for urosepsis.  Patient has been receiving IM Rocephin for UTI at assisted living facility since 03/12/22.  Pharmacy has been consulted for vancomycin and cefepime dosing.  TM 100.4, WBC 11.4, Scr elevated at 2.11 (previous records from 2021 indicate baseline Scr is ~0.8-0.9)  Plan: Vancomycin '1500mg'$  IV x1 Given unstable renal function, will check vancomycin random level with AM labs on 6/3.  Redose vancomycin when random level < 20 Cefepime 2g IV q24 hours for CrCl 10-30 mL/min Flagyl '500mg'$  IV q12h per MD F/u culture data, renal function, ability to narrow antibiotics     Temp (24hrs), Avg:100.4 F (38 C), Min:100.4 F (38 C), Max:100.4 F (38 C)  Recent Labs  Lab 03/15/22 1402 03/15/22 1403  WBC  --  11.4*  CREATININE  --  2.11*  LATICACIDVEN 1.5  --     CrCl cannot be calculated (Unknown ideal weight.).    Allergies  Allergen Reactions   Sulfa Antibiotics Other (See Comments)    Reaction:  Unknown   Codeine Hives    Antimicrobials this admission: Vancomycin 6/2 >>  Cefepime 6/2 >>  Flagyl 6/2 >>  Dose adjustments this admission:  Microbiology results: 6/2 BCx:  6/2 UCx:   Thank you for allowing pharmacy to be a part of this patient's care.  Dimple Nanas, PharmD 03/15/2022 5:04 PM

## 2022-03-15 NOTE — Plan of Care (Signed)
  Problem: Coping: Goal: Level of anxiety will decrease Outcome: Progressing   Problem: Pain Managment: Goal: General experience of comfort will improve Outcome: Progressing   Problem: Safety: Goal: Ability to remain free from injury will improve Outcome: Progressing   Problem: Skin Integrity: Goal: Risk for impaired skin integrity will decrease Outcome: Progressing   

## 2022-03-16 DIAGNOSIS — D696 Thrombocytopenia, unspecified: Secondary | ICD-10-CM

## 2022-03-16 DIAGNOSIS — A419 Sepsis, unspecified organism: Secondary | ICD-10-CM | POA: Diagnosis not present

## 2022-03-16 DIAGNOSIS — N179 Acute kidney failure, unspecified: Secondary | ICD-10-CM | POA: Diagnosis not present

## 2022-03-16 DIAGNOSIS — I482 Chronic atrial fibrillation, unspecified: Secondary | ICD-10-CM | POA: Diagnosis not present

## 2022-03-16 DIAGNOSIS — N3000 Acute cystitis without hematuria: Secondary | ICD-10-CM | POA: Diagnosis not present

## 2022-03-16 LAB — COMPREHENSIVE METABOLIC PANEL
ALT: 12 U/L (ref 0–44)
AST: 20 U/L (ref 15–41)
Albumin: 2 g/dL — ABNORMAL LOW (ref 3.5–5.0)
Alkaline Phosphatase: 141 U/L — ABNORMAL HIGH (ref 38–126)
Anion gap: 5 (ref 5–15)
BUN: 75 mg/dL — ABNORMAL HIGH (ref 8–23)
CO2: 24 mmol/L (ref 22–32)
Calcium: 9.1 mg/dL (ref 8.9–10.3)
Chloride: 123 mmol/L — ABNORMAL HIGH (ref 98–111)
Creatinine, Ser: 1.68 mg/dL — ABNORMAL HIGH (ref 0.44–1.00)
GFR, Estimated: 28 mL/min — ABNORMAL LOW (ref >60)
Glucose, Bld: 121 mg/dL — ABNORMAL HIGH (ref 70–99)
Potassium: 3.1 mmol/L — ABNORMAL LOW (ref 3.5–5.1)
Sodium: 152 mmol/L — ABNORMAL HIGH (ref 135–145)
Total Bilirubin: 0.6 mg/dL (ref 0.3–1.2)
Total Protein: 5.3 g/dL — ABNORMAL LOW (ref 6.5–8.1)

## 2022-03-16 LAB — CBC
HCT: 39 % (ref 36.0–46.0)
Hemoglobin: 12.4 g/dL (ref 12.0–15.0)
MCH: 31.1 pg (ref 26.0–34.0)
MCHC: 31.8 g/dL (ref 30.0–36.0)
MCV: 97.7 fL (ref 80.0–100.0)
Platelets: 129 10*3/uL — ABNORMAL LOW (ref 150–400)
RBC: 3.99 MIL/uL (ref 3.87–5.11)
RDW: 16.3 % — ABNORMAL HIGH (ref 11.5–15.5)
WBC: 9.7 10*3/uL (ref 4.0–10.5)
nRBC: 0 % (ref 0.0–0.2)

## 2022-03-16 LAB — LACTIC ACID, PLASMA
Lactic Acid, Venous: 1.8 mmol/L (ref 0.5–1.9)
Lactic Acid, Venous: 1.8 mmol/L (ref 0.5–1.9)

## 2022-03-16 LAB — MRSA NEXT GEN BY PCR, NASAL: MRSA by PCR Next Gen: NOT DETECTED

## 2022-03-16 LAB — MAGNESIUM: Magnesium: 2.2 mg/dL (ref 1.7–2.4)

## 2022-03-16 LAB — VANCOMYCIN, RANDOM: Vancomycin Rm: 20 ug/mL

## 2022-03-16 MED ORDER — POTASSIUM CHLORIDE 2 MEQ/ML IV SOLN
INTRAVENOUS | Status: DC
  Filled 2022-03-16 (×9): qty 1000

## 2022-03-16 MED ORDER — LACTATED RINGERS IV BOLUS
500.0000 mL | Freq: Once | INTRAVENOUS | Status: AC
  Administered 2022-03-16: 500 mL via INTRAVENOUS

## 2022-03-16 MED ORDER — POTASSIUM CHLORIDE 10 MEQ/100ML IV SOLN
10.0000 meq | INTRAVENOUS | Status: AC
  Administered 2022-03-16 (×2): 10 meq via INTRAVENOUS
  Filled 2022-03-16 (×2): qty 100

## 2022-03-16 MED ORDER — POTASSIUM CHLORIDE 2 MEQ/ML IV SOLN: INTRAVENOUS | Status: DC

## 2022-03-16 MED ORDER — METOPROLOL TARTRATE 5 MG/5ML IV SOLN
2.5000 mg | INTRAVENOUS | Status: DC | PRN
  Administered 2022-03-17 – 2022-03-20 (×4): 2.5 mg via INTRAVENOUS
  Filled 2022-03-16 (×4): qty 5

## 2022-03-16 NOTE — Progress Notes (Signed)
Rapid Response Event Note   Reason for Call : Pt has elevated HR 130's, with RED MEWS of 5.   Initial Focused Assessment: Pt unresponsive , pupils reactive, moans to sternal rub.  Breath sounds clear and decreased.  Heart Rate in 130's atrial fib. (See flowsheet).    Interventions:  Due to pt VS (see flowsheet) and AMS  [facility] provider called to come to bedside.  Pt arrived to room 1415 in this state per bedside RN.  Lopressor IV given per orders for elevated HR.    Plan of Care: Pt to transfer to [place] due to mental status per [facility] provider.  Event Summary:   MD Notified: Yes Call Time: 2155 Arrival Time: 2210 End Time: 2230  Dyann Ruddle, RN

## 2022-03-16 NOTE — Progress Notes (Signed)
PROGRESS NOTE    Jillian Cantrell  IEP:[id number] DOB: 1926/12/06 DOA: 03/15/2022 PCP: System, Provider Not In   Brief Narrative:  88 y.o. female with medical history significant of dementia currently residing in a facility, mostly bedbound, recurrent UTIs, chronic A-fib, not on anticoagulation because of history of GI bleed, hypertension, CVA, thyroid cancer status post thyroidectomy, hypothyroidism, hyperlipidemia who was sent from SNF for altered mental status and fever despite being on IM Rocephin for the last few days prior to presentation.  On presentation, patient was febrile, tachycardic and almost nonverbal.   She was found to have UTI, mild leukocytosis, hypernatremia.  She was started on IV fluids and antibiotics.  Chest x-ray was negative for infiltrates.  Assessment & Plan:   Sepsis: Present on admission UTI -Presented with worsened mental status, fever, tachycardia, tachypnea, leukocytosis and possible UTI.  Patient was on IM Rocephin for the last few days at SNF. -COVID and influenza negative -Continue cefepime and Flagyl along with vancomycin for now. -CT renal stone study showed bilateral lead-based staghorn calculi without hydronephrosis or ureteral calculus with scattered colonic diverticuli without acute diverticulitis   Acute metabolic encephalopathy History of dementia, mostly bedbound -Patient normally is bedbound, not very responsive but normally eats okay as per the patient's daughter at bedside. -She has been more drowsy over the last few days.  Monitor mental status.  CT of the head without contrast was negative for acute intracranial abnormality. -Fall precautions. -Palliative care consultation for goals of care discussion -If condition does not improve in the next 24 to 48 hours, patient will benefit from hospice/comfort measures   Chronic A-fib with RVR -Heart rate improving but still tachycardic.  Not on rate controlling agent or anticoagulation as an  outpatient.  Continue IV fluid hydration.  If blood pressure allows, will use metoprolol.    Leukocytosis -Resolved  Hypernatremia Dehydration - mild.  Possibly from dehydration.  Switch IV fluids to D5.  Repeat a.m. labs.   AKI -creatinine 2.11 on presentation.  Possibly prerenal.  Creatinine 1.68 today.  Continue IV fluids.  Repeat a.m. labs  Thrombocytopenia -No signs of bleeding.  Monitor   Hypertension -Blood pressure on the lower side.  Monitor.  Antihypertensives on hold       DVT prophylaxis: Heparin Code Status: DNR Family Communication: Daughter at bedside Disposition Plan: Status is: Inpatient Remains inpatient appropriate because: Of severity of illness    Consultants: Palliative care  Procedures: None  Antimicrobials:  Anti-infectives (From admission, onward)    Start     Dose/Rate Route Frequency Ordered Stop   03/16/22 1800  ceFEPIme (MAXIPIME) 2 g in sodium chloride 0.9 % 100 mL IVPB        2 g 200 mL/hr over 30 Minutes Intravenous Every 24 hours 03/15/22 1753     03/15/22 1944  vancomycin variable dose per unstable renal function (pharmacist dosing)         Does not apply See admin instructions 03/15/22 1944     03/15/22 1730  vancomycin (VANCOREADY) IVPB 1500 mg/300 mL        1,500 mg 150 mL/hr over 120 Minutes Intravenous  Once 03/15/22 1719 03/15/22 2152   03/15/22 1715  ceFEPIme (MAXIPIME) 2 g in sodium chloride 0.9 % 100 mL IVPB        2 g 200 mL/hr over 30 Minutes Intravenous  Once 03/15/22 1710 03/15/22 1815   03/15/22 1715  metroNIDAZOLE (FLAGYL) IVPB 500 mg        500  mg 100 mL/hr over 60 Minutes Intravenous Every 12 hours 03/15/22 1712     03/15/22 1645  piperacillin-tazobactam (ZOSYN) IVPB 3.375 g  Status:  Discontinued        3.375 g 100 mL/hr over 30 Minutes Intravenous  Once 03/15/22 1637 03/15/22 1710   03/15/22 1630  cefTRIAXone (ROCEPHIN) 2 g in sodium chloride 0.9 % 100 mL IVPB  Status:  Discontinued        2 g 200 mL/hr over  30 Minutes Intravenous Every 24 hours 03/15/22 1615 03/15/22 1637        Subjective: Patient seen and examined at bedside.  Nonverbal.  No seizures, fever, vomiting, agitation reported.  Nursing staff reports intermittent tachycardia.  Objective: Vitals:   03/16/22 0503 03/16/22 0509 03/16/22 0700 03/16/22 0800  BP:   104/62 114/66  Pulse: (!) 110 (!) 107 (!) 114 (!) 117  Resp: '19 17 19 18  '$ Temp: (!) 97.5 F (36.4 C) (!) 97.5 F (36.4 C) (!) 97.5 F (36.4 C) 97.9 F (36.6 C)  TempSrc:      SpO2: 94% 95% 96% 97%  Weight:      Height:        Intake/Output Summary (Last 24 hours) at 03/16/2022 0809 Last data filed at 03/16/2022 0600 Gross per 24 hour  Intake 1732.68 ml  Output 475 ml  Net 1257.68 ml   Filed Weights   03/15/22 1739 03/16/22 0424  Weight: 81.6 kg 77.5 kg    Examination:  General exam: Appears calm and comfortable.  Looks chronically ill and deconditioned.  Elderly female lying in bed.  Nonverbal.  Extremities elevated. Respiratory system: Bilateral decreased breath sounds at bases with some scattered crackles Cardiovascular system: S1 & S2 heard, tachycardic  gastrointestinal system: Abdomen is nondistended, soft and nontender. Normal bowel sounds heard. Extremities: No cyanosis, clubbing, edema  Central nervous system: Wakes up slightly, intermittently moans.  Nonverbal.  No focal neurological deficits. Moving extremities Skin: No rashes, lesions or ulcers Psychiatry: Could not be assessed because of mental status.  Showing no signs of agitation currently.  Data Reviewed: I have personally reviewed following labs and imaging studies  CBC: Recent Labs  Lab 03/15/22 1403 03/15/22 1822 03/16/22 0255  WBC 11.4* 8.7 9.7  NEUTROABS 9.0*  --   --   HGB 15.6* 11.5* 12.4  HCT 47.6* 36.8 39.0  MCV 95.8 99.2 97.7  PLT 153 122* 174*   Basic Metabolic Panel: Recent Labs  Lab 03/15/22 1403 03/15/22 1822 03/16/22 0255  NA 153*  --  152*  K 4.2  --   3.1*  CL 124*  --  123*  CO2 20*  --  24  GLUCOSE 149*  --  121*  BUN 86*  --  75*  CREATININE 2.11* 1.61* 1.68*  CALCIUM 9.6  --  9.1  MG  --   --  2.2   GFR: Estimated Creatinine Clearance: 23.4 mL/min (A) (by C-G formula based on SCr of 1.68 mg/dL (H)). Liver Function Tests: Recent Labs  Lab 03/15/22 1403 03/16/22 0255  AST 26 20  ALT 16 12  ALKPHOS 178* 141*  BILITOT 1.0 0.6  PROT 7.0 5.3*  ALBUMIN 2.7* 2.0*   No results for input(s): LIPASE, AMYLASE in the last 168 hours. No results for input(s): AMMONIA in the last 168 hours. Coagulation Profile: Recent Labs  Lab 03/15/22 1403  INR 1.2   Cardiac Enzymes: No results for input(s): CKTOTAL, CKMB, CKMBINDEX, TROPONINI in the last 168 hours. BNP (last  3 results) No results for input(s): PROBNP in the last 8760 hours. HbA1C: No results for input(s): HGBA1C in the last 72 hours. CBG: Recent Labs  Lab 03/15/22 2218  GLUCAP 109*   Lipid Profile: No results for input(s): CHOL, HDL, LDLCALC, TRIG, CHOLHDL, LDLDIRECT in the last 72 hours. Thyroid Function Tests: No results for input(s): TSH, T4TOTAL, FREET4, T3FREE, THYROIDAB in the last 72 hours. Anemia Panel: No results for input(s): VITAMINB12, FOLATE, FERRITIN, TIBC, IRON, RETICCTPCT in the last 72 hours. Sepsis Labs: Recent Labs  Lab 03/15/22 1402 03/15/22 1800 03/16/22 0025 03/16/22 0255  LATICACIDVEN 1.5 1.7 1.8 1.8    Recent Results (from the past 240 hour(s))  Culture, blood (Routine x 2)     Status: None (Preliminary result)   Collection Time: 03/15/22  2:03 PM   Specimen: BLOOD  Result Value Ref Range Status   Specimen Description   Final    BLOOD BLOOD RIGHT WRIST Performed at [facility] [address], [place], [place] [address]    Special Requests   Final    BOTTLES DRAWN AEROBIC AND ANAEROBIC Blood Culture results may not be optimal due to an inadequate volume of blood received in culture bottles Performed at [facility] [address] [place], [place] [address]    Culture   Final    NO GROWTH < 12 HOURS Performed at [facility] [address] [place], [place] [address]    Report Status PENDING  Incomplete  Culture, blood (Routine x 2)     Status: None (Preliminary result)   Collection Time: 03/15/22  2:12 PM   Specimen: BLOOD  Result Value Ref Range Status   Specimen Description   Final    BLOOD RIGHT ANTECUBITAL Performed at [facility] [address], [place], [place] [address]    Special Requests   Final    BOTTLES DRAWN AEROBIC AND ANAEROBIC Blood Culture results may not be optimal due to an inadequate volume of blood received in culture bottles Performed at [facility] [address], [place], [place] [address]    Culture   Final    NO GROWTH < 12 HOURS Performed at [facility] [address] [place], [place] [address]    Report Status PENDING  Incomplete  SARS Coronavirus 2 by RT PCR (hospital order, performed in [facility] hospital lab) *cepheid single result test* Anterior Nasal Swab     Status: None   Collection Time: 03/15/22  4:45 PM   Specimen: Anterior Nasal Swab  Result Value Ref Range Status   SARS Coronavirus 2 by RT PCR NEGATIVE NEGATIVE Final    Comment: (NOTE) SARS-CoV-2 target nucleic acids are NOT DETECTED.  The SARS-CoV-2 RNA is generally detectable in upper and lower respiratory specimens during the acute phase of infection. The lowest concentration of SARS-CoV-2 viral copies this assay can detect is 250 copies / mL. A negative result does not preclude SARS-CoV-2 infection and should not be used as the sole basis for treatment or other patient management decisions.  A negative result may occur with improper specimen collection / handling, submission of specimen other than nasopharyngeal swab, presence of viral mutation(s) within the areas targeted by this assay, and inadequate number  of viral copies (<250 copies / mL). A negative result must be combined with clinical observations, patient history, and epidemiological information.  Fact Sheet for Patients:   https://www.patel.info/  Fact Sheet for Healthcare Providers: https://hall.com/  This test is not yet approved or  cleared by the [place] and has been authorized for detection and/or diagnosis of SARS-CoV-2 by FDA under an Emergency Use Authorization (EUA).  This EUA will remain in effect (meaning this test can be used) for the duration of the COVID-19 declaration under Section 564(b)(1) of the Act, 21 U.S.C. section 360bbb-3(b)(1), unless the authorization is terminated or revoked sooner.  Performed at [facility], [place] [address], [place], [place] [address]   MRSA Next Gen by PCR, Nasal     Status: None   Collection Time: 03/15/22 11:01 PM   Specimen: Nasal Mucosa; Nasal Swab  Result Value Ref Range Status   MRSA by PCR Next Gen NOT DETECTED NOT DETECTED Final    Comment: (NOTE) The GeneXpert MRSA Assay (FDA approved for NASAL specimens only), is one component of a comprehensive MRSA colonization surveillance program. It is not intended to diagnose MRSA infection nor to guide or monitor treatment for MRSA infections. Test performance is not FDA approved in patients less than 71 years old. Performed at [facility], [place] [address], [place], [place] [address]          Radiology Studies: DG Chest 2 View  Result Date: 03/15/2022 CLINICAL DATA:  Possible sepsis. EXAM: CHEST - 2 VIEW COMPARISON:  Chest x-ray dated December 15, 2019. FINDINGS: Unchanged mild cardiomegaly. Normal pulmonary vascularity. No focal consolidation, pleural effusion, or pneumothorax. No acute osseous abnormality. End-stage left glenohumeral osteoarthritis again noted. IMPRESSION: 1. No acute cardiopulmonary disease. Electronically Signed   By:  Titus Dubin M.D.   On: 03/15/2022 14:44   CT Head Wo Contrast  Result Date: 03/15/2022 CLINICAL DATA:  Mental status changes. EXAM: CT HEAD WITHOUT CONTRAST TECHNIQUE: Contiguous axial images were obtained from the base of the skull through the vertex without intravenous contrast. RADIATION DOSE REDUCTION: This exam was performed according to the departmental dose-optimization program which includes automated exposure control, adjustment of the mA and/or kV according to patient size and/or use of iterative reconstruction technique. COMPARISON:  12/15/2019 FINDINGS: Brain: Calcified meningiomas about both frontal convexities are similar including on the order of 1.1 and 1.2 cm. Left frontoparietal encephalomalacia from remote infarct is not significantly changed. Ex vacuo dilatation of the lateral ventricles secondary to atrophy and encephalomalacia. Advanced cerebral and cerebellar volume loss. No hemorrhage, acute infarct, mass lesion, intra-axial, or extra-axial fluid collection. Vascular: No hyperdense vessel or unexpected calcification. Intracranial atherosclerosis. Skull: Normal Sinuses/Orbits: Surgical changes about both globes. Clear paranasal sinuses and mastoid air cells. Other: None. IMPRESSION: 1. No acute intracranial abnormality. 2. Remote left frontoparietal infarct. 3. Cerebral/cerebellar volume loss. 4. Similar calcified meningiomas about both frontal convexities. Electronically Signed   By: Abigail Miyamoto M.D.   On: 03/15/2022 17:39   CT Renal Stone Study  Result Date: 03/15/2022 CLINICAL DATA:  Flank pain, kidney stone suspected. Altered mental status. EXAM: CT ABDOMEN AND PELVIS WITHOUT CONTRAST TECHNIQUE: Multidetector CT imaging of the abdomen and pelvis was performed following the standard protocol without IV contrast. RADIATION DOSE REDUCTION: This exam was performed according to the departmental dose-optimization program which includes automated exposure control, adjustment of the mA  and/or kV according to patient size and/or use of iterative reconstruction technique. COMPARISON:  CT examination dated May 30, 2016 FINDINGS: Lower chest: Bibasilar dependent atelectasis. Coronary artery atherosclerotic disease. Hepatobiliary: No focal liver abnormality is seen. Status post cholecystectomy. No biliary dilatation. Pancreas: Unremarkable. No pancreatic ductal dilatation or surrounding inflammatory changes. Spleen:  Normal in size without focal abnormality. Adrenals/Urinary Tract: Adrenal glands are unremarkable. There are 2 large calculi in the right renal pelvis measuring up to 1.6 cm. Right ureter is normal in caliber. No ureteral stone. There is also staghorn calculus in the left renal pelvis measuring up to 2 cm. Left ureter is also normal in caliber without evidence of hydronephrosis. Mild nonspecific bilateral perinephric fat stranding. Bladder is unremarkable. Stomach/Bowel: Stomach is within normal limits. Appendix not identified. No evidence of bowel wall thickening, distention, or inflammatory changes. Scattered colonic diverticuli without evidence of acute diverticulitis. Vascular/Lymphatic: Aortic atherosclerosis. No enlarged abdominal or pelvic lymph nodes. Reproductive: Status post hysterectomy. No adnexal masses. Other: No abdominal wall hernia or abnormality. No abdominopelvic ascites. Musculoskeletal: Osteopenia and degenerate disc disease of the lumbar spine. No acute osseous abnormality. Generalized muscle atrophy. IMPRESSION: 1. Nephrolithiasis, with bilateral renal pelvis staghorn calculi without evidence of hydronephrosis or ureteral calculus. 2.  Urinary bladder is unremarkable. 3. Scattered colonic diverticuli without evidence of acute diverticulitis. Bowel loops are normal in caliber. 3. Additional chronic findings as above. No CT evidence of acute abdominal/pelvic process. Electronically Signed   By: Keane Police D.O.   On: 03/15/2022 16:57        Scheduled Meds:   Chlorhexidine Gluconate Cloth  6 each Topical Daily   heparin  5,000 Units Subcutaneous Q8H   metoprolol tartrate       vancomycin variable dose per unstable renal function (pharmacist dosing)   Does not apply See admin instructions   Continuous Infusions:  ceFEPime (MAXIPIME) IV     dextrose 5 % with kcl 75 mL/hr at 03/16/22 0748   metronidazole Stopped (03/16/22 0526)          Aline August, MD [facility] 03/16/2022, 8:09 AM

## 2022-03-16 NOTE — Progress Notes (Signed)
Brief Pharmacy Antibiotic Note regarding Vancomycin:  Today, Serum creatinine improved at 1.68 08:12 Vancomycin random level = 20 ug/mL Last dose of vancomycin was 1500 mg on 6/2 at 2144  Pharmacy is pulse dosing vancomycin due to unstable renal function.  Plan: Repeat random vancomycin level with AM labs on 6/4 Re-dose vancomycin when random level less than [place], PharmD, [place] Please utilize Amion for appropriate phone number to reach the unit pharmacist (Perrin) 03/16/2022 10:27 AM

## 2022-03-17 DIAGNOSIS — Z7189 Other specified counseling: Secondary | ICD-10-CM | POA: Diagnosis not present

## 2022-03-17 DIAGNOSIS — N3000 Acute cystitis without hematuria: Secondary | ICD-10-CM | POA: Diagnosis not present

## 2022-03-17 DIAGNOSIS — I482 Chronic atrial fibrillation, unspecified: Secondary | ICD-10-CM | POA: Diagnosis not present

## 2022-03-17 DIAGNOSIS — N179 Acute kidney failure, unspecified: Secondary | ICD-10-CM | POA: Diagnosis not present

## 2022-03-17 DIAGNOSIS — A419 Sepsis, unspecified organism: Secondary | ICD-10-CM | POA: Diagnosis not present

## 2022-03-17 DIAGNOSIS — D696 Thrombocytopenia, unspecified: Secondary | ICD-10-CM

## 2022-03-17 DIAGNOSIS — Z515 Encounter for palliative care: Secondary | ICD-10-CM | POA: Diagnosis not present

## 2022-03-17 LAB — COMPREHENSIVE METABOLIC PANEL
ALT: 13 U/L (ref 0–44)
AST: 20 U/L (ref 15–41)
Albumin: 2.1 g/dL — ABNORMAL LOW (ref 3.5–5.0)
Alkaline Phosphatase: 151 U/L — ABNORMAL HIGH (ref 38–126)
Anion gap: 3 — ABNORMAL LOW (ref 5–15)
BUN: 63 mg/dL — ABNORMAL HIGH (ref 8–23)
CO2: 26 mmol/L (ref 22–32)
Calcium: 9.4 mg/dL (ref 8.9–10.3)
Chloride: 121 mmol/L — ABNORMAL HIGH (ref 98–111)
Creatinine, Ser: 1.39 mg/dL — ABNORMAL HIGH (ref 0.44–1.00)
GFR, Estimated: 36 mL/min — ABNORMAL LOW (ref >60)
Glucose, Bld: 157 mg/dL — ABNORMAL HIGH (ref 70–99)
Potassium: 4 mmol/L (ref 3.5–5.1)
Sodium: 150 mmol/L — ABNORMAL HIGH (ref 135–145)
Total Bilirubin: 0.6 mg/dL (ref 0.3–1.2)
Total Protein: 5.4 g/dL — ABNORMAL LOW (ref 6.5–8.1)

## 2022-03-17 LAB — CBC WITH DIFFERENTIAL/PLATELET
Abs Immature Granulocytes: 0.77 10*3/uL — ABNORMAL HIGH (ref 0.00–0.07)
Basophils Absolute: 0.1 10*3/uL (ref 0.0–0.1)
Basophils Relative: 1 %
Eosinophils Absolute: 0.1 10*3/uL (ref 0.0–0.5)
Eosinophils Relative: 1 %
HCT: 42.8 % (ref 36.0–46.0)
Hemoglobin: 13.6 g/dL (ref 12.0–15.0)
Immature Granulocytes: 7 %
Lymphocytes Relative: 12 %
Lymphs Abs: 1.4 10*3/uL (ref 0.7–4.0)
MCH: 31.3 pg (ref 26.0–34.0)
MCHC: 31.8 g/dL (ref 30.0–36.0)
MCV: 98.6 fL (ref 80.0–100.0)
Monocytes Absolute: 1 10*3/uL (ref 0.1–1.0)
Monocytes Relative: 9 %
Neutro Abs: 8 10*3/uL — ABNORMAL HIGH (ref 1.7–7.7)
Neutrophils Relative %: 70 %
Platelets: 146 10*3/uL — ABNORMAL LOW (ref 150–400)
RBC: 4.34 MIL/uL (ref 3.87–5.11)
RDW: 16.5 % — ABNORMAL HIGH (ref 11.5–15.5)
WBC: 11.4 10*3/uL — ABNORMAL HIGH (ref 4.0–10.5)
nRBC: 0 % (ref 0.0–0.2)

## 2022-03-17 LAB — MAGNESIUM: Magnesium: 2 mg/dL (ref 1.7–2.4)

## 2022-03-17 LAB — VANCOMYCIN, RANDOM: Vancomycin Rm: 12 ug/mL

## 2022-03-17 MED ORDER — FENTANYL CITRATE (PF) 100 MCG/2ML IJ SOLN
12.5000 ug | INTRAMUSCULAR | Status: DC | PRN
  Administered 2022-03-18: 12.5 ug via INTRAVENOUS
  Filled 2022-03-17: qty 2

## 2022-03-17 MED ORDER — VANCOMYCIN HCL 1500 MG/300ML IV SOLN
1500.0000 mg | INTRAVENOUS | Status: DC
  Administered 2022-03-17: 1500 mg via INTRAVENOUS
  Filled 2022-03-17: qty 300

## 2022-03-17 NOTE — Progress Notes (Signed)
PROGRESS NOTE    Jillian Cantrell  YDX:[id number] DOB: [birth year] DOA: 03/15/2022 PCP: System, Provider Not In   Brief Narrative:  88 y.o. female with medical history significant of dementia currently residing in a facility, mostly bedbound, recurrent UTIs, chronic A-fib, not on anticoagulation because of history of GI bleed, hypertension, CVA, thyroid cancer status post thyroidectomy, hypothyroidism, hyperlipidemia who was sent from SNF for altered mental status and fever despite being on IM Rocephin for the last few days prior to presentation.  On presentation, patient was febrile, tachycardic and almost nonverbal.   She was found to have UTI, mild leukocytosis, hypernatremia.  She was started on IV fluids and antibiotics.  Chest x-ray was negative for infiltrates.  Assessment & Plan:   Sepsis: Present on admission UTI -Presented with worsened mental status, fever, tachycardia, tachypnea, leukocytosis and possible UTI.  Patient was on IM Rocephin for the last few days at SNF. -COVID and influenza negative -Currently on cefepime and Flagyl along with vancomycin for now. -CT renal stone study showed bilateral lead-based staghorn calculi without hydronephrosis or ureteral calculus with scattered colonic diverticuli without acute diverticulitis -Blood and urine cultures negative so far.   Acute metabolic encephalopathy History of dementia, mostly bedbound -Patient normally is bedbound, not very responsive but normally eats okay as per the patient's daughter -She has been more drowsy over the last few days.  Monitor mental status.  CT of the head without contrast was negative for acute intracranial abnormality. -Fall precautions. -Palliative care consultation for goals of care discussion is pending -If condition does not improve in the next 24 to 48 hours, patient will benefit from hospice/comfort measures   Chronic A-fib with RVR -Heart rate improving but still tachycardic.  Not on rate  controlling agent or anticoagulation as an outpatient.  Continue IV fluid hydration.  If blood pressure allows, will use metoprolol.   -Blood pressure improving; start scheduled oral metoprolol if able to swallow  Leukocytosis -Resolved  Hypernatremia Dehydration - mild.  Possibly from dehydration.  Continue IV fluids.  Labs pending for today.  AKI -creatinine 2.11 on presentation.  Possibly prerenal.  Creatinine 1.68 on 03/16/2022.  Pending for today.  Continue IV fluids.  Repeat a.m. labs  Thrombocytopenia -No signs of bleeding.  Monitor   Hypertension -Blood pressure improving.  Monitor.  Antihypertensives on hold       DVT prophylaxis: Heparin Code Status: DNR Family Communication: Daughter and grandson at bedside Disposition Plan: Status is: Inpatient Remains inpatient appropriate because: Of severity of illness    Consultants: Palliative care  Procedures: None  Antimicrobials:  Anti-infectives (From admission, onward)    Start     Dose/Rate Route Frequency Ordered Stop   03/16/22 1800  ceFEPIme (MAXIPIME) 2 g in sodium chloride 0.9 % 100 mL IVPB        2 g 200 mL/hr over 30 Minutes Intravenous Every 24 hours 03/15/22 1753     03/15/22 1944  vancomycin variable dose per unstable renal function (pharmacist dosing)         Does not apply See admin instructions 03/15/22 1944     03/15/22 1730  vancomycin (VANCOREADY) IVPB 1500 mg/300 mL        1,500 mg 150 mL/hr over 120 Minutes Intravenous  Once 03/15/22 1719 03/15/22 2152   03/15/22 1715  ceFEPIme (MAXIPIME) 2 g in sodium chloride 0.9 % 100 mL IVPB        2 g 200 mL/hr over 30 Minutes Intravenous  Once 03/15/22  1710 03/15/22 1815   03/15/22 1715  metroNIDAZOLE (FLAGYL) IVPB 500 mg        500 mg 100 mL/hr over 60 Minutes Intravenous Every 12 hours 03/15/22 1712     03/15/22 1645  piperacillin-tazobactam (ZOSYN) IVPB 3.375 g  Status:  Discontinued        3.375 g 100 mL/hr over 30 Minutes Intravenous  Once  03/15/22 1637 03/15/22 1710   03/15/22 1630  cefTRIAXone (ROCEPHIN) 2 g in sodium chloride 0.9 % 100 mL IVPB  Status:  Discontinued        2 g 200 mL/hr over 30 Minutes Intravenous Every 24 hours 03/15/22 1615 03/15/22 1637        Subjective: Patient seen and examined at bedside.  Nonverbal.  No agitation, vomiting, fever, seizures reported.  Objective: Vitals:   03/17/22 0300 03/17/22 0400 03/17/22 0500 03/17/22 0600  BP:  (!) 141/85 127/72 (!) 139/97  Pulse: (!) 132 (!) 122 (!) 122 (!) 113  Resp: (!) '22 12 19 14  '$ Temp: (!) 97.2 F (36.2 C) 97.7 F (36.5 C) 97.7 F (36.5 C) (!) 97.5 F (36.4 C)  TempSrc:  Bladder  Bladder  SpO2: (!) 84% 96% 92% 96%  Weight:   77.6 kg   Height:        Intake/Output Summary (Last 24 hours) at 03/17/2022 0740 Last data filed at 03/17/2022 0645 Gross per 24 hour  Intake 2054.77 ml  Output 1475 ml  Net 579.77 ml    Filed Weights   03/15/22 1739 03/16/22 0424 03/17/22 0500  Weight: 81.6 kg 77.5 kg 77.6 kg    Examination:  General: On room air.  No distress.  Elderly female lying in bed.  Chronically ill and deconditioned looking. ENT/neck: No thyromegaly.  JVD is not elevated  respiratory: Decreased breath sounds at bases bilaterally with some crackles; no wheezing  CVS: S1-S2 heard, tachycardic  abdominal: Soft, nontender, slightly distended; no organomegaly, normal bowel sounds are heard Extremities: Trace lower extremity edema; no cyanosis  CNS: Wakes up very slightly does not follow commands, moans to painful stimuli.  No focal neurologic deficit.  Moves extremities Lymph: No obvious lymphadenopathy Skin: No obvious ecchymosis/lesions  psych: Could not be assessed because of mental status.  Currently not agitated.  Musculoskeletal: No obvious joint swelling/deformity   Data Reviewed: I have personally reviewed following labs and imaging studies  CBC: Recent Labs  Lab 03/15/22 1403 03/15/22 1822 03/16/22 0255  WBC 11.4* 8.7  9.7  NEUTROABS 9.0*  --   --   HGB 15.6* 11.5* 12.4  HCT 47.6* 36.8 39.0  MCV 95.8 99.2 97.7  PLT 153 122* 129*    Basic Metabolic Panel: Recent Labs  Lab 03/15/22 1403 03/15/22 1822 03/16/22 0255  NA 153*  --  152*  K 4.2  --  3.1*  CL 124*  --  123*  CO2 20*  --  24  GLUCOSE 149*  --  121*  BUN 86*  --  75*  CREATININE 2.11* 1.61* 1.68*  CALCIUM 9.6  --  9.1  MG  --   --  2.2    GFR: Estimated Creatinine Clearance: 23.4 mL/min (A) (by C-G formula based on SCr of 1.68 mg/dL (H)). Liver Function Tests: Recent Labs  Lab 03/15/22 1403 03/16/22 0255  AST 26 20  ALT 16 12  ALKPHOS 178* 141*  BILITOT 1.0 0.6  PROT 7.0 5.3*  ALBUMIN 2.7* 2.0*    No results for input(s): LIPASE, AMYLASE in the  last 168 hours. No results for input(s): AMMONIA in the last 168 hours. Coagulation Profile: Recent Labs  Lab 03/15/22 1403  INR 1.2    Cardiac Enzymes: No results for input(s): CKTOTAL, CKMB, CKMBINDEX, TROPONINI in the last 168 hours. BNP (last 3 results) No results for input(s): PROBNP in the last 8760 hours. HbA1C: No results for input(s): HGBA1C in the last 72 hours. CBG: Recent Labs  Lab 03/15/22 2218  GLUCAP 109*    Lipid Profile: No results for input(s): CHOL, HDL, LDLCALC, TRIG, CHOLHDL, LDLDIRECT in the last 72 hours. Thyroid Function Tests: No results for input(s): TSH, T4TOTAL, FREET4, T3FREE, THYROIDAB in the last 72 hours. Anemia Panel: No results for input(s): VITAMINB12, FOLATE, FERRITIN, TIBC, IRON, RETICCTPCT in the last 72 hours. Sepsis Labs: Recent Labs  Lab 03/15/22 1402 03/15/22 1800 03/16/22 0025 03/16/22 0255  LATICACIDVEN 1.5 1.7 1.8 1.8     Recent Results (from the past 240 hour(s))  Culture, blood (Routine x 2)     Status: None (Preliminary result)   Collection Time: 03/15/22  2:03 PM   Specimen: BLOOD  Result Value Ref Range Status   Specimen Description   Final    BLOOD BLOOD RIGHT WRIST Performed at [facility] [address] [place], [place] [address]    Special Requests   Final    BOTTLES DRAWN AEROBIC AND ANAEROBIC Blood Culture results may not be optimal due to an inadequate volume of blood received in culture bottles Performed at [facility] [address] [place], [place] [address]    Culture   Final    NO GROWTH 2 DAYS Performed at [facility] [address], [place], [place] [address]    Report Status PENDING  Incomplete  Culture, blood (Routine x 2)     Status: None (Preliminary result)   Collection Time: 03/15/22  2:12 PM   Specimen: BLOOD  Result Value Ref Range Status   Specimen Description   Final    BLOOD RIGHT ANTECUBITAL Performed at [facility] [address] [place], [place] [address]    Special Requests   Final    BOTTLES DRAWN AEROBIC AND ANAEROBIC Blood Culture results may not be optimal due to an inadequate volume of blood received in culture bottles Performed at [facility] [address] [place],  [address]    Culture   Final    NO GROWTH 2 DAYS Performed at [facility] [address], [place],  [address]    Report Status PENDING  Incomplete  SARS Coronavirus 2 by RT PCR (hospital order, performed in [facility] hospital lab) *cepheid single result test* Anterior Nasal Swab     Status: None   Collection Time: 03/15/22  4:45 PM   Specimen: Anterior Nasal Swab  Result Value Ref Range Status   SARS Coronavirus 2 by RT PCR NEGATIVE NEGATIVE Final    Comment: (NOTE) SARS-CoV-2 target nucleic acids are NOT DETECTED.  The SARS-CoV-2 RNA is generally detectable in upper and lower respiratory specimens during the acute phase of infection. The lowest concentration of SARS-CoV-2 viral copies this assay can detect is 250 copies / mL. A negative result does not preclude SARS-CoV-2 infection and should not be used as the sole basis for treatment or  other patient management decisions.  A negative result may occur with improper specimen collection / handling, submission of specimen other than nasopharyngeal swab, presence of viral mutation(s) within the  areas targeted by this assay, and inadequate number of viral copies (<250 copies / mL). A negative result must be combined with clinical observations, patient history, and epidemiological information.  Fact Sheet for Patients:   https://www.patel.info/  Fact Sheet for Healthcare Providers: https://hall.com/  This test is not yet approved or  cleared by the [place] FDA and has been authorized for detection and/or diagnosis of SARS-CoV-2 by FDA under an Emergency Use Authorization (EUA).  This EUA will remain in effect (meaning this test can be used) for the duration of the COVID-19 declaration under Section 564(b)(1) of the Act, 21 U.S.C. section 360bbb-3(b)(1), unless the authorization is terminated or revoked sooner.  Performed at [facility], [place] [address], [place], [place] [address]   MRSA Next Gen by PCR, Nasal     Status: None   Collection Time: 03/15/22 11:01 PM   Specimen: Nasal Mucosa; Nasal Swab  Result Value Ref Range Status   MRSA by PCR Next Gen NOT DETECTED NOT DETECTED Final    Comment: (NOTE) The GeneXpert MRSA Assay (FDA approved for NASAL specimens only), is one component of a comprehensive MRSA colonization surveillance program. It is not intended to diagnose MRSA infection nor to guide or monitor treatment for MRSA infections. Test performance is not FDA approved in patients less than 64 years old. Performed at [facility], [place] [address] [place], [place] [address]           Radiology Studies: DG Chest 2 View  Result Date: 03/15/2022 CLINICAL DATA:  Possible sepsis. EXAM: CHEST - 2 VIEW COMPARISON:  Chest x-ray dated December 15, 2019. FINDINGS: Unchanged mild  cardiomegaly. Normal pulmonary vascularity. No focal consolidation, pleural effusion, or pneumothorax. No acute osseous abnormality. End-stage left glenohumeral osteoarthritis again noted. IMPRESSION: 1. No acute cardiopulmonary disease. Electronically Signed   By: Titus Dubin M.D.   On: 03/15/2022 14:44   CT Head Wo Contrast  Result Date: 03/15/2022 CLINICAL DATA:  Mental status changes. EXAM: CT HEAD WITHOUT CONTRAST TECHNIQUE: Contiguous axial images were obtained from the base of the skull through the vertex without intravenous contrast. RADIATION DOSE REDUCTION: This exam was performed according to the departmental dose-optimization program which includes automated exposure control, adjustment of the mA and/or kV according to patient size and/or use of iterative reconstruction technique. COMPARISON:  12/15/2019 FINDINGS: Brain: Calcified meningiomas about both frontal convexities are similar including on the order of 1.1 and 1.2 cm. Left frontoparietal encephalomalacia from remote infarct is not significantly changed. Ex vacuo dilatation of the lateral ventricles secondary to atrophy and encephalomalacia. Advanced cerebral and cerebellar volume loss. No hemorrhage, acute infarct, mass lesion, intra-axial, or extra-axial fluid collection. Vascular: No hyperdense vessel or unexpected calcification. Intracranial atherosclerosis. Skull: Normal Sinuses/Orbits: Surgical changes about both globes. Clear paranasal sinuses and mastoid air cells. Other: None. IMPRESSION: 1. No acute intracranial abnormality. 2. Remote left frontoparietal infarct. 3. Cerebral/cerebellar volume loss. 4. Similar calcified meningiomas about both frontal convexities. Electronically Signed   By: Abigail Miyamoto M.D.   On: 03/15/2022 17:39   CT Renal Stone Study  Result Date: 03/15/2022 CLINICAL DATA:  Flank pain, kidney stone suspected. Altered mental status. EXAM: CT ABDOMEN AND PELVIS WITHOUT CONTRAST TECHNIQUE: Multidetector CT  imaging of the abdomen and pelvis was performed following the standard protocol without IV contrast. RADIATION DOSE REDUCTION: This exam was performed according to the departmental dose-optimization program which includes automated exposure control, adjustment of the mA and/or kV according to patient size and/or use  of iterative reconstruction technique. COMPARISON:  CT examination dated May 30, 2016 FINDINGS: Lower chest: Bibasilar dependent atelectasis. Coronary artery atherosclerotic disease. Hepatobiliary: No focal liver abnormality is seen. Status post cholecystectomy. No biliary dilatation. Pancreas: Unremarkable. No pancreatic ductal dilatation or surrounding inflammatory changes. Spleen: Normal in size without focal abnormality. Adrenals/Urinary Tract: Adrenal glands are unremarkable. There are 2 large calculi in the right renal pelvis measuring up to 1.6 cm. Right ureter is normal in caliber. No ureteral stone. There is also staghorn calculus in the left renal pelvis measuring up to 2 cm. Left ureter is also normal in caliber without evidence of hydronephrosis. Mild nonspecific bilateral perinephric fat stranding. Bladder is unremarkable. Stomach/Bowel: Stomach is within normal limits. Appendix not identified. No evidence of bowel wall thickening, distention, or inflammatory changes. Scattered colonic diverticuli without evidence of acute diverticulitis. Vascular/Lymphatic: Aortic atherosclerosis. No enlarged abdominal or pelvic lymph nodes. Reproductive: Status post hysterectomy. No adnexal masses. Other: No abdominal wall hernia or abnormality. No abdominopelvic ascites. Musculoskeletal: Osteopenia and degenerate disc disease of the lumbar spine. No acute osseous abnormality. Generalized muscle atrophy. IMPRESSION: 1. Nephrolithiasis, with bilateral renal pelvis staghorn calculi without evidence of hydronephrosis or ureteral calculus. 2.  Urinary bladder is unremarkable. 3. Scattered colonic  diverticuli without evidence of acute diverticulitis. Bowel loops are normal in caliber. 3. Additional chronic findings as above. No CT evidence of acute abdominal/pelvic process. Electronically Signed   By: Keane Police D.O.   On: 03/15/2022 16:57        Scheduled Meds:  Chlorhexidine Gluconate Cloth  6 each Topical Daily   heparin  5,000 Units Subcutaneous Q8H   vancomycin variable dose per unstable renal function (pharmacist dosing)   Does not apply See admin instructions   Continuous Infusions:  ceFEPime (MAXIPIME) IV Stopped (03/16/22 1801)   dextrose 5 % with kcl 75 mL/hr at 03/17/22 0645   metronidazole Stopped (03/17/22 2585)          Aline August, MD [facility] 03/17/2022, 7:40 AM

## 2022-03-17 NOTE — Progress Notes (Signed)
Pharmacy Antibiotic Note  Jillian Cantrell is a 88 y.o. female admitted on 03/15/2022 with altered mental status and fever, concern for urosepsis.  Patient has been receiving IM Rocephin for UTI at assisted living facility since 03/12/22.  Pharmacy has been consulted for vancomycin and cefepime dosing.  Today, 03/17/22 Patient is afebrile, WBC back up to 11.4, Scr has trended down to 1.39 (previous records from 2021 indicate baseline Scr is ~0.8-0.9). Repeat Vancomycin random level less than 20  Plan: Continue vancomycin at 1500 mg IV every 48 hours (eAUC 515.9, Scr 1.39) Continue Cefepime 2g IV q24 hours for CrCl 10-30 mL/min Flagyl '500mg'$  IV q12h per MD F/u culture data, renal function, ability to narrow antibiotics  Height: '5\' 8"'$  (172.7 cm) Weight: 77.6 kg (171 lb 1.2 oz) IBW/kg (Calculated) : 63.9  Temp (24hrs), Avg:98.4 F (36.9 C), Min:97.2 F (36.2 C), Max:99.1 F (37.3 C)  Recent Labs  Lab 03/15/22 1402 03/15/22 1403 03/15/22 1800 03/15/22 1822 03/16/22 0025 03/16/22 0255 03/16/22 0812 03/17/22 0702  WBC  --  11.4*  --  8.7  --  9.7  --  11.4*  CREATININE  --  2.11*  --  1.61*  --  1.68*  --  1.39*  LATICACIDVEN 1.5  --  1.7  --  1.8 1.8  --   --   VANCORANDOM  --   --   --   --   --   --  20 12     Estimated Creatinine Clearance: 28.3 mL/min (A) (by C-G formula based on SCr of 1.39 mg/dL (H)).    Allergies  Allergen Reactions   Sulfa Antibiotics Other (See Comments)    Reaction:  Unknown   Codeine Hives    Antimicrobials this admission: Vancomycin 6/2 >>  Cefepime 6/2 >>  Flagyl 6/2 >>  Dose adjustments this admission:  Microbiology results: 6/2 BCx: NGTD 6/2 UCx:  pending 6/2 MRSA PCR: not detected Thank you for allowing pharmacy to be a part of this patient's care.  Royetta Asal, PharmD, BCPS Clinical Pharmacist [place] Please utilize Amion for appropriate phone number to reach the unit pharmacist ([place]) 03/17/2022 11:03 AM

## 2022-03-17 NOTE — Progress Notes (Signed)
Initial Nutrition Assessment  DOCUMENTATION CODES:   Not applicable  INTERVENTION:   Diet advancement as able per SLP.  RD to add appropriate supplements as appropriate when diet is advanced.   NUTRITION DIAGNOSIS:   Inadequate oral intake related to inability to eat as evidenced by NPO status.  GOAL:   Patient will meet greater than or equal to 90% of their needs  MONITOR:   Diet advancement, PO intake  REASON FOR ASSESSMENT:   Malnutrition Screening Tool    ASSESSMENT:   88 yo female admitted with AMS, fever, UTI. PMH includes dementia, mostly bedbound, recurrent UTIs, chronic A fib, HTN, CVA, thyroid cancer S/P thyroidectomy, hypothyroidism, HLD.  Patient is currently NPO. SLP evaluation has been ordered.   RD working remotely. Unable to complete NFPE at this time.  Per admission nutrition screen, patient is on a regular diet with nectar thick liquids at nursing facility. She has been eating poorly d/t decreased appetite and has lost 14-23 lbs recently.   Palliative Care team has been consulted for goals of care.   Labs reviewed. Na 150 Medications reviewed.  No recent weights available for review. Patient weighed 80.3 kg in 2019, currently 77.6 kg.  NUTRITION - FOCUSED PHYSICAL EXAM:  Unable to complete  Diet Order:   Diet Order     None       EDUCATION NEEDS:   No education needs have been identified at this time  Skin:  Skin Assessment: Reviewed RN Assessment  Last BM:  no BM documented  Height:   Ht Readings from Last 1 Encounters:  03/15/22 '5\' 8"'$  (1.727 m)    Weight:   Wt Readings from Last 1 Encounters:  03/17/22 77.6 kg     BMI:  Body mass index is 26.01 kg/m.  Estimated Nutritional Needs:   Kcal:  9935-7017  Protein:  90-100 gm  Fluid:  >/= 1.8 L    Lucas Mallow RD, LDN, CNSC Please refer to Amion for contact information.

## 2022-03-17 NOTE — Evaluation (Signed)
Clinical/Bedside Swallow Evaluation Patient Details  Name: ANALIE KATZMAN MRN: [id number] Date of Birth: [birth year]  Today's Date: 03/17/2022 Time: SLP Start Time (ACUTE ONLY): 1440 SLP Stop Time (ACUTE ONLY): 65 SLP Time Calculation (min) (ACUTE ONLY): 30 min  Past Medical History:  Past Medical History:  Diagnosis Date   Atrial fibrillation with normal ventricular rate ([place]) 10/2015   Rate control, no anticoagulation secondary to GI bleed   Cancer ([place])    thyroid cancer s/p thyroidectomy   Glaucoma    Hypertension    Iatrogenic hypothyroidism    Stroke [facility]) 2003   Chronic right-sided weakness   Thyroid disease    Past Surgical History:  Past Surgical History:  Procedure Laterality Date   ABDOMINAL HYSTERECTOMY     APPENDECTOMY     CHOLECYSTECTOMY     THYROIDECTOMY     VARICOSE VEIN SURGERY     HPI:  Patient is a 88 y.o. female with PMH: dementia, HTN, recurrent UTI's, CVA, thyroid cancer post thyroidectomy, hypothyroidism, HLD, mostly bedbound. She arrived at hospital from SNF for AMS and fever. In ED, patient was febrile, tachycardic and almost nonverbal. She was found to have a UTI, mild leukocytosis, hypernatremia. CXR was negative for infiltrates. Per RD note, "Per admission nutrition screen, patient is on a regular diet with nectar thick liquids at nursing facility. She has been eating poorly d/t decreased appetite and has lost 14-23 lbs recently".    Assessment / Plan / Recommendation  Clinical Impression  Patient currently presenting with clinical s/s of dysphagia as per this bedside/clinical swallow evaluation. SLP spoke with RN prior to evaluation who informed SLP that she didnt think patient would pass swallow eval as she has a significant amount of dried secretions in oral cavity of which she was able to remove some. When SLP examined patient's oral cavity, dried secretions covered her entired hard and soft palate, inside of lips and cheeks and a thick large glob  of phlegm/secretions at oropharynx. SLP performed extensive amount of oral care and did remove large piece of dried secretions that had covered hard palate. This did result in two small areas on hard palate of bleeding but no active bleeding obseved when oral care completed. Patient was very cooperative but visably in discomfort during oral care. Despite this discomfort, she readily opened mouth for SLP to inspect and continue oral care. Despite patient efforts, she was only able to produce a very weak, non productive cough. SLP is recommending to continue NPO status and plan to follow patient for readiness to trial PO's. SLP Visit Diagnosis: Dysphagia, unspecified (R13.10)    Aspiration Risk  Risk for inadequate nutrition/hydration;Severe aspiration risk    Diet Recommendation NPO   Medication Administration: Via alternative means    Other  Recommendations Oral Care Recommendations: Oral care QID;Staff/trained caregiver to provide oral care    Recommendations for follow up therapy are one component of a multi-disciplinary discharge planning process, led by the attending physician.  Recommendations may be updated based on patient status, additional functional criteria and insurance authorization.  Follow up Recommendations Skilled nursing-short term rehab (<3 hours/day)      Assistance Recommended at Discharge Frequent or constant Supervision/Assistance  Functional Status Assessment Patient has had a recent decline in their functional status and demonstrates the ability to make significant improvements in function in a reasonable and predictable amount of time.  Frequency and Duration min 3x week  2 weeks       Prognosis Prognosis for Safe Diet  Advancement: Good Barriers to Reach Goals: Cognitive deficits;Time post onset      Swallow Study   General Date of Onset: 03/15/22 HPI: Patient is a 88 y.o. female with PMH: dementia, HTN, recurrent UTI's, CVA, thyroid cancer post  thyroidectomy, hypothyroidism, HLD, mostly bedbound. She arrived at hospital from SNF for AMS and fever. In ED, patient was febrile, tachycardic and almost nonverbal. She was found to have a UTI, mild leukocytosis, hypernatremia. CXR was negative for infiltrates. Per RD note, "Per admission nutrition screen, patient is on a regular diet with nectar thick liquids at nursing facility. She has been eating poorly d/t decreased appetite and has lost 14-23 lbs recently". Type of Study: Bedside Swallow Evaluation Previous Swallow Assessment: none found Diet Prior to this Study: NPO Temperature Spikes Noted: No Respiratory Status: Room air History of Recent Intubation: No Behavior/Cognition: Alert;Pleasant mood;Cooperative Oral Cavity Assessment: Dried secretions;Excessive secretions Oral Care Completed by SLP: Yes Oral Cavity - Dentition: Adequate natural dentition Patient Positioning: Upright in bed Baseline Vocal Quality: Not observed Volitional Cough: Weak Volitional Swallow: Unable to elicit    Oral/Motor/Sensory Function Overall Oral Motor/Sensory Function: Other (comment) (unable to fully assess but no significant oral motor weakness or assymetry observed)   Ice Chips     Thin Liquid Thin Liquid: Not tested    Nectar Thick Nectar Thick Liquid: Not tested   Honey Thick Honey Thick Liquid: Not tested   Puree Puree: Not tested   Solid     Solid: Not tested      Sonia Baller, MA, CCC-SLP Speech Therapy

## 2022-03-17 NOTE — Consult Note (Signed)
Consultation Note Date: 03/17/2022   Patient Name: Jillian Cantrell  DOB: 09/19/1927  MRN: [id number]  Age / Sex: 88 y.o., female  PCP: System, Provider Not In Referring Physician: Aline August, MD  Reason for Consultation: Establishing goals of care  HPI/Patient Profile: 88 y.o. female  with past medical history of dementia, long-term skilled facility resident, mostly bedbound, recurrent UTIs, chronic A-fib not on anticoagulation due to history of GI bleed, hypertension, CVA, thyroid cancer status post thyroidectomy, hypothyroidism, hyperlipidemia admitted on 03/15/2022 with altered mental status and fever despite being on IM Rocephin due to concern for UTI at facility.  Admission work-up revealed sepsis, acute encephalopathy, and AKI.  She was also tachycardic with known history of A-fib with RVR and also found to be hyponatremic.  Mental status has not really improved to this point.  Palliative consulted for goals of care.  Clinical Assessment and Goals of Care: I met today with patient's family including her daughter (who is an Estate agent) and 3 grandchildren.  I introduced palliative care as specialized medical care for people living with serious illness. It focuses on providing relief from the symptoms and stress of a serious illness. The goal is to improve quality of life for both the patient and the family.  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  We discussed clinical course as well as wishes moving forward in regard to advanced directives.  Concepts specific to nutrition, cognition, and functional status addressed and further care plan for this hospitalization discussed.  We discussed difference between a aggressive medical intervention path and a palliative, comfort focused care path.     We specifically discussed concerns about nutrition and family in agreement that feeding tube would not be something that would add quality to Jillian Cantrell's life nor is it something that she is likely to tolerate.  Overall, family would like to give a chance to see if she responds to therapies but also understands that we may be approaching end-of-life regardless of interventions.  In either case, ensuring she is not suffering is of utmost importance to them.  We did discuss concern about pain and making sure that she is out of pain and not uncomfortable.  On arrival today, they report that she was potentially groaning and much more restless. We discussed plan to continue with current interventions with addition of as needed pain medication and reassess her situation tomorrow.     Questions and concerns addressed.   PMT will continue to support holistically.   SUMMARY OF RECOMMENDATIONS   -DNR/DNI -Family in agreement with no feeding tube. -Continue current interventions and planning to reassess her situation again tomorrow.  I am planning to stop and check in with her daughter around 65.  Code Status/Advance Care Planning: DNR  Symptom Management:  Pain: Plan for addition of low-dose fentanyl as needed.  She was noted to be restless earlier today and medication should be  given for her comfort as needed.  Palliative Prophylaxis:  Delirium Protocol and Frequent Pain Assessment  Prognosis:  Guarded  Discharge Planning: To Be Determined      Primary Diagnoses: Present on Admission:  UTI (urinary tract infection)  Atrial fibrillation ([place])   I have reviewed the medical record, interviewed the patient and family, and examined the patient. The following aspects are pertinent.  Past Medical History:  Diagnosis Date   Atrial fibrillation with normal ventricular rate ([place]) 10/2015   Rate control, no anticoagulation secondary to GI bleed   Cancer ([place])    thyroid cancer s/p  thyroidectomy   Glaucoma    Hypertension    Iatrogenic hypothyroidism    Stroke [facility]) 2003   Chronic right-sided weakness   Thyroid disease    Social History   Socioeconomic History   Marital status: Widowed    Spouse name: Not on file   Number of children: Not on file   Years of education: Not on file   Highest education level: Not on file  Occupational History   Not on file  Tobacco Use   Smoking status: Former    Types: Cigarettes    Quit date: 10/14/1968    Years since quitting: 53.4   Smokeless tobacco: Never  Vaping Use   Vaping Use: Never used  Substance and Sexual Activity   Alcohol use: No   Drug use: No   Sexual activity: Never  Other Topics Concern   Not on file  Social History Narrative   Not on file   Social Determinants of Health   Financial Resource Strain: Not on file  Food Insecurity: Not on file  Transportation Needs: Not on file  Physical Activity: Not on file  Stress: Not on file  Social Connections: Not on file   Family History  Problem Relation Age of Onset   Cancer Father        leukemia   Scheduled Meds:  Chlorhexidine Gluconate Cloth  6 each Topical Daily   heparin  5,000 Units Subcutaneous Q8H   Continuous Infusions:  ceFEPime (MAXIPIME) IV Stopped (03/16/22 1801)   dextrose 5 % with kcl 75 mL/hr at 03/17/22 1144   metronidazole Stopped (03/17/22 0621)   vancomycin     PRN Meds:.acetaminophen **OR** acetaminophen, fentaNYL (SUBLIMAZE) injection, metoprolol tartrate, ondansetron **OR** ondansetron (ZOFRAN) IV Medications Prior to Admission:  Prior to Admission medications   Medication Sig Start Date End Date Taking? Authorizing Provider  atorvastatin (LIPITOR) 10 MG tablet Take 1 tablet (10 mg total) by mouth daily at 6 PM. Patient taking differently: Take 10 mg by mouth at bedtime. 02/25/17  Yes Nita Sells, MD  brimonidine (ALPHAGAN) 0.15 % ophthalmic solution Place 1 drop into both eyes 2 (two) times daily.   Yes  [provider]  Bromfenac Sodium 0.075 % SOLN Place 1 drop into the right eye 2 (two) times daily.   Yes [provider]  cefTRIAXone (ROCEPHIN) 1 g injection Inject 1 g into the muscle daily. For 7 days. 03/12/22  Yes [provider]  cyclobenzaprine (FLEXERIL) 5 MG tablet Take 1 tablet (5 mg total) by mouth at bedtime. 02/25/17  Yes Nita Sells, MD  Difluprednate (DUREZOL OP) Place 1 drop into both eyes 2 (two) times daily.   Yes [provider]  diltiazem (CARDIZEM SR) 60 MG 12 hr capsule Take 60 mg by mouth 2 (two) times daily.   Yes [provider]  divalproex (DEPAKOTE SPRINKLE) 125 MG capsule Take by mouth  daily.   Yes [provider]  escitalopram (LEXAPRO) 10 MG tablet Take 10 mg by mouth daily.   Yes [provider]  latanoprost (XALATAN) 0.005 % ophthalmic solution Place 1 drop into both eyes at bedtime.   Yes [provider]  levothyroxine (SYNTHROID) 125 MCG tablet Take 250 mcg by mouth daily.   Yes [provider]  NON FORMULARY Take 1 Dose by mouth daily. Magic cup   Yes [provider]  NON FORMULARY Inject 1 Dose into the vein 2 (two) times daily. 60 cc/hr for 7 days for fluid maintenance.   Yes [provider]  NON FORMULARY Take 120 mLs by mouth in the morning, at noon, in the evening, and at bedtime. liquids   Yes [provider]  spironolactone (ALDACTONE) 50 MG tablet Take 50 mg by mouth daily.   Yes [provider]  apixaban (ELIQUIS) 5 MG TABS tablet Take 1 tablet (5 mg total) by mouth 2 (two) times daily. Patient not taking: Reported on 03/15/2022 07/22/17   Wellington Hampshire, MD  diclofenac (FLECTOR) 1.3 % PTCH Place 1 patch onto the skin 2 (two) times daily. Patient not taking: Reported on 12/15/2019 02/25/17   Nita Sells, MD  diltiazem (CARDIZEM) 120 MG tablet Take 0.5 tablets (60 mg total) by mouth 2 (two) times daily. Patient not taking:  Reported on 12/15/2019 11/07/15   Verlee Monte, MD  levothyroxine (SYNTHROID, LEVOTHROID) 150 MCG tablet Take 1 tablet (150 mcg total) by mouth daily before breakfast. Patient not taking: Reported on 12/15/2019 11/07/15   Verlee Monte, MD   Allergies  Allergen Reactions   Sulfa Antibiotics Other (See Comments)    Reaction:  Unknown   Codeine Hives   Review of Systems Unable to obtain  Physical Exam General: Lying in bed with eyes open.  Nods to a couple of simple questions but does not verbalize or otherwise interact Heart: Tachycardic, regular. Lungs: Fair air movement  Ext: No significant edema Skin: Warm and dry  Vital Signs: BP (!) 141/95   Pulse (!) 119   Temp 97.9 F (36.6 C)   Resp 16   Ht _0  (1.727 m)   Wt 77.6 kg   SpO2 95%   BMI 26.01 kg/m  Pain Scale: PAINAD       SpO2: SpO2: 95 % O2 Device:SpO2: 95 % O2 Flow Rate: .O2 Flow Rate (L/min): 1 L/min  IO: Intake/output summary:  Intake/Output Summary (Last 24 hours) at 03/17/2022 1758 Last data filed at 03/17/2022 0645 Gross per 24 hour  Intake 1120.41 ml  Output 1475 ml  Net -354.59 ml    LBM: Last BM Date :  (PTA) Baseline Weight: Weight: 81.6 kg Most recent weight: Weight: 77.6 kg     Palliative Assessment/Data:   Flowsheet Rows    Flowsheet Row Most Recent Value  Intake Tab   Referral Department Hospitalist  Unit at Time of Referral ICU  Palliative Care Primary Diagnosis Sepsis/Infectious Disease  Date Notified 03/15/22  Palliative Care Type New Palliative care  Reason for referral Clarify Goals of Care  Date of Admission 03/15/22  Date first seen by Palliative Care 03/17/22  # of days Palliative referral response time 2 Day(s)  # of days IP prior to Palliative referral 0  Clinical Assessment   Palliative Performance Scale Score 20%  Psychosocial & Spiritual Assessment   Palliative Care Outcomes   Patient/Family meeting held? Yes  Who was at the meeting? Daughter, 3 grandchildren  Time Total: 60 Greater than 50%  of this time was spent counseling and coordinating care related to the above assessment and plan.  Signed by: Micheline Rough, MD   Please contact Palliative Medicine Team phone at [phone] for questions and concerns.  For individual provider: See Shea Evans

## 2022-03-18 DIAGNOSIS — N3000 Acute cystitis without hematuria: Secondary | ICD-10-CM | POA: Diagnosis not present

## 2022-03-18 DIAGNOSIS — Z7189 Other specified counseling: Secondary | ICD-10-CM | POA: Diagnosis not present

## 2022-03-18 DIAGNOSIS — G9340 Encephalopathy, unspecified: Secondary | ICD-10-CM

## 2022-03-18 DIAGNOSIS — A419 Sepsis, unspecified organism: Secondary | ICD-10-CM | POA: Diagnosis not present

## 2022-03-18 DIAGNOSIS — N179 Acute kidney failure, unspecified: Secondary | ICD-10-CM | POA: Diagnosis not present

## 2022-03-18 DIAGNOSIS — I482 Chronic atrial fibrillation, unspecified: Secondary | ICD-10-CM | POA: Diagnosis not present

## 2022-03-18 LAB — COMPREHENSIVE METABOLIC PANEL
ALT: 13 U/L (ref 0–44)
AST: 28 U/L (ref 15–41)
Albumin: 2.2 g/dL — ABNORMAL LOW (ref 3.5–5.0)
Alkaline Phosphatase: 157 U/L — ABNORMAL HIGH (ref 38–126)
Anion gap: 5 (ref 5–15)
BUN: 47 mg/dL — ABNORMAL HIGH (ref 8–23)
CO2: 24 mmol/L (ref 22–32)
Calcium: 9.2 mg/dL (ref 8.9–10.3)
Chloride: 119 mmol/L — ABNORMAL HIGH (ref 98–111)
Creatinine, Ser: 1.12 mg/dL — ABNORMAL HIGH (ref 0.44–1.00)
GFR, Estimated: 46 mL/min — ABNORMAL LOW (ref >60)
Glucose, Bld: 131 mg/dL — ABNORMAL HIGH (ref 70–99)
Potassium: 4 mmol/L (ref 3.5–5.1)
Sodium: 148 mmol/L — ABNORMAL HIGH (ref 135–145)
Total Bilirubin: 0.7 mg/dL (ref 0.3–1.2)
Total Protein: 5.9 g/dL — ABNORMAL LOW (ref 6.5–8.1)

## 2022-03-18 LAB — CBC WITH DIFFERENTIAL/PLATELET
Abs Immature Granulocytes: 1.25 10*3/uL — ABNORMAL HIGH (ref 0.00–0.07)
Basophils Absolute: 0.1 10*3/uL (ref 0.0–0.1)
Basophils Relative: 1 %
Eosinophils Absolute: 0.3 10*3/uL (ref 0.0–0.5)
Eosinophils Relative: 2 %
HCT: 43.3 % (ref 36.0–46.0)
Hemoglobin: 13.7 g/dL (ref 12.0–15.0)
Immature Granulocytes: 9 %
Lymphocytes Relative: 13 %
Lymphs Abs: 1.8 10*3/uL (ref 0.7–4.0)
MCH: 31.1 pg (ref 26.0–34.0)
MCHC: 31.6 g/dL (ref 30.0–36.0)
MCV: 98.2 fL (ref 80.0–100.0)
Monocytes Absolute: 1 10*3/uL (ref 0.1–1.0)
Monocytes Relative: 7 %
Neutro Abs: 9.4 10*3/uL — ABNORMAL HIGH (ref 1.7–7.7)
Neutrophils Relative %: 68 %
Platelets: 163 10*3/uL (ref 150–400)
RBC: 4.41 MIL/uL (ref 3.87–5.11)
RDW: 16.4 % — ABNORMAL HIGH (ref 11.5–15.5)
WBC: 13.9 10*3/uL — ABNORMAL HIGH (ref 4.0–10.5)
nRBC: 0 % (ref 0.0–0.2)

## 2022-03-18 LAB — URINE CULTURE: Culture: >=100000 — AB

## 2022-03-18 LAB — MAGNESIUM: Magnesium: 1.8 mg/dL (ref 1.7–2.4)

## 2022-03-18 MED ORDER — SODIUM CHLORIDE 0.9 % IV SOLN
2.0000 g | Freq: Three times a day (TID) | INTRAVENOUS | Status: AC
  Administered 2022-03-18 – 2022-03-22 (×11): 2 g via INTRAVENOUS
  Filled 2022-03-18 (×12): qty 2000

## 2022-03-18 MED ORDER — VANCOMYCIN HCL 1750 MG/350ML IV SOLN
1750.0000 mg | INTRAVENOUS | Status: DC
Start: 2022-03-19 — End: 2022-03-18

## 2022-03-18 MED ORDER — FENTANYL CITRATE PF 50 MCG/ML IJ SOSY: 12.5000 ug | PREFILLED_SYRINGE | INTRAMUSCULAR | Status: DC | PRN

## 2022-03-18 NOTE — Progress Notes (Signed)
SLP Cancellation Note  Patient Details Name: Jillian Cantrell MRN: [id number] DOB: [birth year]   Cancelled treatment:       Reason Eval/Treat Not Completed: Patient's level of consciousness;Other (comment) (RN reporting patient more lethargic and less interactive today than yesterday. In addition she is refusing oral care. SLP will f/u next date.)   Sonia Baller, MA, CCC-SLP Speech Therapy

## 2022-03-18 NOTE — Progress Notes (Signed)
PROGRESS NOTE    Jillian Cantrell  SNK:[id number] DOB: January 21, 1927 DOA: 03/15/2022 PCP: System, Provider Not In   Brief Narrative:  88 y.o. female with medical history significant of dementia currently residing in a facility, mostly bedbound, recurrent UTIs, chronic A-fib, not on anticoagulation because of history of GI bleed, hypertension, CVA, thyroid cancer status post thyroidectomy, hypothyroidism, hyperlipidemia who was sent from SNF for altered mental status and fever despite being on IM Rocephin for the last few days prior to presentation.  On presentation, patient was febrile, tachycardic and almost nonverbal.   She was found to have UTI, mild leukocytosis, hypernatremia.  She was started on IV fluids and antibiotics.  Chest x-ray was negative for infiltrates.  Palliative care has been consulted for goals of care discussion.  Assessment & Plan:   Sepsis: Present on admission UTI -Presented with worsened mental status, fever, tachycardia, tachypnea, leukocytosis and possible UTI.  Patient was on IM Rocephin for the last few days at SNF. -COVID and influenza negative -Currently on cefepime and Flagyl along with vancomycin for now. -CT renal stone study showed bilateral lead-based staghorn calculi without hydronephrosis or ureteral calculus with scattered colonic diverticuli without acute diverticulitis -Blood cultures negative so far.  Urine culture positive for Enterococcus faecalis.  Follow susceptibilities.  Leukocytosis -Monitor   Acute metabolic encephalopathy History of dementia, mostly bedbound -Patient normally is bedbound, not very responsive but normally eats okay as per the patient's daughter -She had been more drowsy over the last few days prior to presentation.  Monitor mental status.  CT of the head without contrast was negative for acute intracranial abnormality. -Fall precautions. -Palliative care following. -If condition does not improve in the next 24 to 48 hours,  patient will benefit from hospice/comfort measures -SLP following and currently recommend NPO.   Chronic A-fib with RVR -Heart rate improving but still tachycardic.  Not on rate controlling agent or anticoagulation as an outpatient.  If blood pressure allows, will use metoprolol.   -Blood pressure improving; start scheduled oral metoprolol if able to swallow  Hypernatremia Dehydration - mild.  Possibly from dehydration.  Continue IV fluids.  Sodium 148  AKI -creatinine 2.11 on presentation.  Possibly prerenal.  Creatinine 1.12 today.  Continue IV fluids.  Repeat a.m. labs  Thrombocytopenia -Resolved   Hypertension -Blood pressure improving.  Monitor.  Antihypertensives on hold       DVT prophylaxis: Heparin Code Status: DNR Family Communication: Daughter at bedside Disposition Plan: Status is: Inpatient Remains inpatient appropriate because: Of severity of illness    Consultants: Palliative care  Procedures: None  Antimicrobials:  Anti-infectives (From admission, onward)    Start     Dose/Rate Route Frequency Ordered Stop   03/17/22 2200  vancomycin (VANCOREADY) IVPB 1500 mg/300 mL        1,500 mg 150 mL/hr over 120 Minutes Intravenous Every 48 hours 03/17/22 0934     03/16/22 1800  ceFEPIme (MAXIPIME) 2 g in sodium chloride 0.9 % 100 mL IVPB        2 g 200 mL/hr over 30 Minutes Intravenous Every 24 hours 03/15/22 1753     03/15/22 1944  vancomycin variable dose per unstable renal function (pharmacist dosing)  Status:  Discontinued         Does not apply See admin instructions 03/15/22 1944 03/17/22 0935   03/15/22 1730  vancomycin (VANCOREADY) IVPB 1500 mg/300 mL        1,500 mg 150 mL/hr over 120 Minutes Intravenous  Once  03/15/22 1719 03/15/22 2152   03/15/22 1715  ceFEPIme (MAXIPIME) 2 g in sodium chloride 0.9 % 100 mL IVPB        2 g 200 mL/hr over 30 Minutes Intravenous  Once 03/15/22 1710 03/15/22 1815   03/15/22 1715  metroNIDAZOLE (FLAGYL) IVPB 500 mg         500 mg 100 mL/hr over 60 Minutes Intravenous Every 12 hours 03/15/22 1712     03/15/22 1645  piperacillin-tazobactam (ZOSYN) IVPB 3.375 g  Status:  Discontinued        3.375 g 100 mL/hr over 30 Minutes Intravenous  Once 03/15/22 1637 03/15/22 1710   03/15/22 1630  cefTRIAXone (ROCEPHIN) 2 g in sodium chloride 0.9 % 100 mL IVPB  Status:  Discontinued        2 g 200 mL/hr over 30 Minutes Intravenous Every 24 hours 03/15/22 1615 03/15/22 1637        Subjective: Patient seen and examined at bedside.  Nonverbal.  No fever, vomiting, seizures, agitation reported Objective: Vitals:   03/18/22 0300 03/18/22 0400 03/18/22 0500 03/18/22 0600  BP: 137/77 (!) 124/91 128/87 124/84  Pulse: 99 (!) 110 (!) 123 (!) 116  Resp: 16 14 (!) 24 17  Temp: 98.2 F (36.8 C) 98.4 F (36.9 C) 98.4 F (36.9 C) 98.2 F (36.8 C)  TempSrc:      SpO2: 96% 95% 96% 94%  Weight:   77.6 kg   Height:        Intake/Output Summary (Last 24 hours) at 03/18/2022 0730 Last data filed at 03/18/2022 5366 Gross per 24 hour  Intake 2359.3 ml  Output 1630 ml  Net 729.3 ml    Filed Weights   03/16/22 0424 03/17/22 0500 03/18/22 0500  Weight: 77.5 kg 77.6 kg 77.6 kg    Examination:  General: No distress.  Still on room air. Elderly female lying in bed.  Chronically ill and deconditioned looking. ENT/neck: No JVD elevation.  No neck masses palpated. respiratory: Bilateral decreased breath sounds at bases with intermittent tachypnea  CVS: Currently still tachycardic; S1 and S2 are heard abdominal: Soft, nontender, still distended; no organomegaly, bowel sounds are heard extremities: No clubbing; mild lower extremity edema present CNS: Awake but does not yet follow commands. No focal neurologic deficit.  Moving extremities Lymph: No palpable lymphadenopathy noted  skin: No obvious petechiae/rashes psych: Showing no signs of agitation.  Psych evaluation could not be performed because of mental  status musculoskeletal: No obvious joint swelling/deformity   Data Reviewed: I have personally reviewed following labs and imaging studies  CBC: Recent Labs  Lab 03/15/22 1403 03/15/22 1822 03/16/22 0255 03/17/22 0702 03/18/22 0259  WBC 11.4* 8.7 9.7 11.4* 13.9*  NEUTROABS 9.0*  --   --  8.0* 9.4*  HGB 15.6* 11.5* 12.4 13.6 13.7  HCT 47.6* 36.8 39.0 42.8 43.3  MCV 95.8 99.2 97.7 98.6 98.2  PLT 153 122* 129* 146* 440    Basic Metabolic Panel: Recent Labs  Lab 03/15/22 1403 03/15/22 1822 03/16/22 0255 03/17/22 0702 03/18/22 0259  NA 153*  --  152* 150* 148*  K 4.2  --  3.1* 4.0 4.0  CL 124*  --  123* 121* 119*  CO2 20*  --  '24 26 24  '$ GLUCOSE 149*  --  121* 157* 131*  BUN 86*  --  75* 63* 47*  CREATININE 2.11* 1.61* 1.68* 1.39* 1.12*  CALCIUM 9.6  --  9.1 9.4 9.2  MG  --   --  2.2 2.0 1.8    GFR: Estimated Creatinine Clearance: 35.1 mL/min (A) (by C-G formula based on SCr of 1.12 mg/dL (H)). Liver Function Tests: Recent Labs  Lab 03/15/22 1403 03/16/22 0255 03/17/22 0702 03/18/22 0259  AST '26 20 20 28  '$ ALT '16 12 13 13  '$ ALKPHOS 178* 141* 151* 157*  BILITOT 1.0 0.6 0.6 0.7  PROT 7.0 5.3* 5.4* 5.9*  ALBUMIN 2.7* 2.0* 2.1* 2.2*    No results for input(s): LIPASE, AMYLASE in the last 168 hours. No results for input(s): AMMONIA in the last 168 hours. Coagulation Profile: Recent Labs  Lab 03/15/22 1403  INR 1.2    Cardiac Enzymes: No results for input(s): CKTOTAL, CKMB, CKMBINDEX, TROPONINI in the last 168 hours. BNP (last 3 results) No results for input(s): PROBNP in the last 8760 hours. HbA1C: No results for input(s): HGBA1C in the last 72 hours. CBG: Recent Labs  Lab 03/15/22 2218  GLUCAP 109*    Lipid Profile: No results for input(s): CHOL, HDL, LDLCALC, TRIG, CHOLHDL, LDLDIRECT in the last 72 hours. Thyroid Function Tests: No results for input(s): TSH, T4TOTAL, FREET4, T3FREE, THYROIDAB in the last 72 hours. Anemia Panel: No results for  input(s): VITAMINB12, FOLATE, FERRITIN, TIBC, IRON, RETICCTPCT in the last 72 hours. Sepsis Labs: Recent Labs  Lab 03/15/22 1402 03/15/22 1800 03/16/22 0025 03/16/22 0255  LATICACIDVEN 1.5 1.7 1.8 1.8     Recent Results (from the past 240 hour(s))  Culture, blood (Routine x 2)     Status: None (Preliminary result)   Collection Time: 03/15/22  2:03 PM   Specimen: BLOOD  Result Value Ref Range Status   Specimen Description   Final    BLOOD BLOOD RIGHT WRIST Performed at [facility] [address], [place], [place] [address]    Special Requests   Final    BOTTLES DRAWN AEROBIC AND ANAEROBIC Blood Culture results may not be optimal due to an inadequate volume of blood received in culture bottles Performed at [facility] [address], [place], [place] [address]    Culture   Final    NO GROWTH 2 DAYS Performed at [facility] [address], [place], [place] [address]    Report Status PENDING  Incomplete  Urine Culture     Status: Abnormal (Preliminary result)   Collection Time: 03/15/22  2:03 PM   Specimen: Urine, Catheterized  Result Value Ref Range Status   Specimen Description   Final    URINE, CATHETERIZED Performed at [facility] [address], [place], [place] [address]    Special Requests   Final    NONE Performed at [facility], [place] [address] [place], [place] [address]    Culture (A)  Final    >=100,000 COLONIES/mL ENTEROCOCCUS FAECALIS SUSCEPTIBILITIES TO FOLLOW Performed at [facility], [place] [address], [place], [place] [address]    Report Status PENDING  Incomplete  Culture, blood (Routine x 2)     Status: None (Preliminary result)   Collection Time: 03/15/22  2:12 PM   Specimen: BLOOD  Result Value Ref Range Status   Specimen Description   Final    BLOOD RIGHT ANTECUBITAL Performed at [facility] [address], [place],   [address]    Special Requests   Final    BOTTLES DRAWN AEROBIC AND ANAEROBIC Blood Culture results may not be optimal due to an inadequate volume of blood received in culture bottles  Performed at [facility], [place] [address] [place], [place] [address]    Culture   Final    NO GROWTH 2 DAYS Performed at [facility] [address] [place], [place] [address]    Report Status PENDING  Incomplete  SARS Coronavirus 2 by RT PCR (hospital order, performed in [facility] hospital lab) *cepheid single result test* Anterior Nasal Swab     Status: None   Collection Time: 03/15/22  4:45 PM   Specimen: Anterior Nasal Swab  Result Value Ref Range Status   SARS Coronavirus 2 by RT PCR NEGATIVE NEGATIVE Final    Comment: (NOTE) SARS-CoV-2 target nucleic acids are NOT DETECTED.  The SARS-CoV-2 RNA is generally detectable in upper and lower respiratory specimens during the acute phase of infection. The lowest concentration of SARS-CoV-2 viral copies this assay can detect is 250 copies / mL. A negative result does not preclude SARS-CoV-2 infection and should not be used as the sole basis for treatment or other patient management decisions.  A negative result may occur with improper specimen collection / handling, submission of specimen other than nasopharyngeal swab, presence of viral mutation(s) within the areas targeted by this assay, and inadequate number of viral copies (<250 copies / mL). A negative result must be combined with clinical observations, patient history, and epidemiological information.  Fact Sheet for Patients:   https://www.patel.info/  Fact Sheet for Healthcare Providers: https://hall.com/  This test is not yet approved or  cleared by the [place] FDA and has been authorized for detection and/or diagnosis of SARS-CoV-2 by FDA under an Emergency Use Authorization (EUA).  This EUA will remain in effect  (meaning this test can be used) for the duration of the COVID-19 declaration under Section 564(b)(1) of the Act, 21 U.S.C. section 360bbb-3(b)(1), unless the authorization is terminated or revoked sooner.  Performed at [facility], [place] [address], [place], [place] [address]   MRSA Next Gen by PCR, Nasal     Status: None   Collection Time: 03/15/22 11:01 PM   Specimen: Nasal Mucosa; Nasal Swab  Result Value Ref Range Status   MRSA by PCR Next Gen NOT DETECTED NOT DETECTED Final    Comment: (NOTE) The GeneXpert MRSA Assay (FDA approved for NASAL specimens only), is one component of a comprehensive MRSA colonization surveillance program. It is not intended to diagnose MRSA infection nor to guide or monitor treatment for MRSA infections. Test performance is not FDA approved in patients less than 49 years old. Performed at [facility], [place] [address], [place], [place] [address]           Radiology Studies: No results found.      Scheduled Meds:  Chlorhexidine Gluconate Cloth  6 each Topical Daily   heparin  5,000 Units Subcutaneous Q8H   Continuous Infusions:  ceFEPime (MAXIPIME) IV Stopped (03/17/22 1909)   dextrose 5 % with kcl 75 mL/hr at 03/18/22 0614   metronidazole Stopped (03/18/22 0551)   vancomycin Stopped (03/17/22 2352)          Aline August, MD [facility] 03/18/2022, 7:30 AM

## 2022-03-18 NOTE — Progress Notes (Signed)
Daily Progress Note   Patient Name: Jillian Cantrell       Date: 03/18/2022 DOB: September 28, 1927  Age: 88 y.o. MRN#: [id number] Attending Physician: Aline August, MD Primary Care Physician: System, Provider Not In Admit Date: 03/15/2022  Reason for Consultation/Follow-up: Establishing goals of care  Subjective: I met today with Jillian Cantrell's daughter.  We reviewed clinical course and plan of care with continuation of current care to see how she continues to progress over the next couple of days.  Again discussed that nutrition is a primary concern and she is not currently taking in enough to sustain herself.  Daughter expressed understanding and we reviewed that she understands and agrees with recommendation not to pursue a feeding tube.  Length of Stay: 3  Current Medications: Scheduled Meds:   Chlorhexidine Gluconate Cloth  6 each Topical Daily   heparin  5,000 Units Subcutaneous Q8H    Continuous Infusions:  ampicillin (OMNIPEN) IV 2 g (03/18/22 1517)   dextrose 5 % with kcl 75 mL/hr at 03/18/22 1437    PRN Meds: acetaminophen **OR** acetaminophen, fentaNYL (SUBLIMAZE) injection, metoprolol tartrate, ondansetron **OR** ondansetron (ZOFRAN) IV  Physical Exam         General: Lying in bed with eyes open.  Nods to a couple of simple questions but does not verbalize or otherwise interact Heart: Tachycardic, regular. Lungs: Fair air movement  Ext: No significant edema Skin: Warm and dry  Vital Signs: BP 117/83 (BP Location: Left Arm)   Pulse (!) 103   Temp 98 F (36.7 C) (Oral)   Resp 14   Ht '5\' 8"'  (1.727 m)   Wt 77.6 kg   SpO2 98%   BMI 26.01 kg/m  SpO2: SpO2: 98 % O2 Device: O2 Device: Room Air O2 Flow Rate: O2 Flow Rate (L/min): 1 L/min  Intake/output summary:   Intake/Output Summary (Last 24 hours) at 03/18/2022 1759 Last data filed at 03/18/2022 1700 Gross per 24 hour  Intake 3187.08 ml  Output 2180 ml  Net 1007.08 ml   LBM: Last BM Date :  (PTA) Baseline Weight: Weight: 81.6 kg Most recent weight: Weight: 77.6 kg       Palliative Assessment/Data:    Flowsheet Rows    Flowsheet Row Most Recent Value  Intake Tab   Referral Department Hospitalist  Unit at  Time of Referral ICU  Palliative Care Primary Diagnosis Sepsis/Infectious Disease  Date Notified 03/15/22  Palliative Care Type New Palliative care  Reason for referral Clarify Goals of Care  Date of Admission 03/15/22  Date first seen by Palliative Care 03/17/22  # of days Palliative referral response time 2 Day(s)  # of days IP prior to Palliative referral 0  Clinical Assessment   Palliative Performance Scale Score 20%  Psychosocial & Spiritual Assessment   Palliative Care Outcomes   Patient/Family meeting held? Yes  Who was at the meeting? 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 female  with past  medical history of dementia, long-term skilled facility resident, mostly bedbound, recurrent UTIs, chronic A-fib not on anticoagulation due to history of GI bleed, hypertension, CVA, thyroid cancer status post thyroidectomy, hypothyroidism, hyperlipidemia admitted on 03/15/2022 with altered mental status and fever despite being on IM Rocephin due to concern for UTI at facility.  Admission work-up revealed sepsis, acute encephalopathy, and AKI.  She was also tachycardic with known history of A-fib with RVR and also found to be hyponatremic.  Mental status has not really improved to this point.  Palliative consulted for goals of care.  Recommendations/Plan: DNR/DNI No feeding tube Continue current interventions.  Family working to process and desire to see how she does clinically for another day or two.  Code Status:    Code Status Orders  (From admission, onward)           Start     Ordered   03/15/22 1656  Do not attempt resuscitation (DNR)  Continuous       Question Answer Comment  In the event of cardiac or respiratory ARREST Do not call a "code blue"   In the event of cardiac or respiratory ARREST Do not perform Intubation, CPR, defibrillation or ACLS   In the event of cardiac or respiratory ARREST Use medication by any route, position, wound care, and other measures to relive pain and suffering. May use oxygen, suction and manual treatment of airway obstruction as needed for comfort.      03/15/22 1657           Code Status History     Date Active Date Inactive Code Status Order ID Comments User Context   02/24/2017 1735 02/25/2017 2007 DNR [id number]  Nita Sells, MD Inpatient   05/30/2016 1728 06/01/2016 1727 Full Code [id number]  Kelvin Cellar, MD Inpatient   11/01/2015 1119 11/07/2015 1611 Full Code [id number]  Waldemar Dickens, MD ED      Advance Directive Documentation    [place]  Most Recent Value  Type of Advance Directive Out of facility DNR (pink MOST or  yellow form)  Pre-existing out of facility DNR order (yellow form or pink MOST form) --  "MOST" Form in Place? --       Prognosis:  Guarded/poor  Discharge Planning: To Be Determined  Care plan was discussed with daughter  Thank you for allowing the Palliative Medicine Team to assist in the care of this patient.  Micheline Rough, MD  Please contact Palliative Medicine Team phone at [phone] for questions and concerns.

## 2022-03-18 NOTE — Progress Notes (Addendum)
Pharmacy Antibiotic Note  Jillian Cantrell is a 88 y.o. female admitted on 03/15/2022 with UTI.  Pharmacy has been consulted for Vanco dosing.>>>Change to Ampicillin   ID: on abx outpatient for UTI 2 days PTA -on Rocephin IM since 5/30 at SNF. - Afebrile, WBC 13.9 up, Scr 1.12 down, tachycardic  Antimicrobials this admission: Vancomycin 6/2 >>  Cefepime 6/2 >>  Flagyl 6/2 >>   Dose adjustments this admission: 6/4: Vanco '1500mg'$  IV q48h (eAUC 515.9 using Scr 1.39)   Microbiology results: 6/2 BCx: ngtd 6/2 UCx:  >100,000 Enterococcus Faecalis pan S 6/2: MrSA PCR: neg  Plan: Change Vanco to '1750mg'$  IV q48h (AUC 477, Scr 1.12) Cefepime 2g IV q24 hours for CrCl 10-30 mL/min   Adden: 4580: d/c current abx and change to Ampicillin 2g IV q8hr per Dr. Starla Link   Height: '5\' 8"'$  (172.7 cm) Weight: 77.6 kg (171 lb 1.2 oz) IBW/kg (Calculated) : 63.9  Temp (24hrs), Avg:98.2 F (36.8 C), Min:97.3 F (36.3 C), Max:98.8 F (37.1 C)  Recent Labs  Lab 03/15/22 1402 03/15/22 1403 03/15/22 1800 03/15/22 1822 03/16/22 0025 03/16/22 0255 03/16/22 0812 03/17/22 0702 03/18/22 0259  WBC  --  11.4*  --  8.7  --  9.7  --  11.4* 13.9*  CREATININE  --  2.11*  --  1.61*  --  1.68*  --  1.39* 1.12*  LATICACIDVEN 1.5  --  1.7  --  1.8 1.8  --   --   --   VANCORANDOM  --   --   --   --   --   --  20 12  --     Estimated Creatinine Clearance: 35.1 mL/min (A) (by C-G formula based on SCr of 1.12 mg/dL (H)).    Allergies  Allergen Reactions   Sulfa Antibiotics Other (See Comments)    Reaction:  Unknown   Codeine Hives     Zachary Nole S. Alford Highland, PharmD, BCPS Clinical Staff Pharmacist [email or web address] [place] 03/18/2022 2:09 PM

## 2022-03-19 DIAGNOSIS — Z7189 Other specified counseling: Secondary | ICD-10-CM | POA: Diagnosis not present

## 2022-03-19 DIAGNOSIS — Z515 Encounter for palliative care: Secondary | ICD-10-CM | POA: Diagnosis not present

## 2022-03-19 DIAGNOSIS — A419 Sepsis, unspecified organism: Secondary | ICD-10-CM | POA: Diagnosis not present

## 2022-03-19 DIAGNOSIS — N3000 Acute cystitis without hematuria: Secondary | ICD-10-CM | POA: Diagnosis not present

## 2022-03-19 DIAGNOSIS — R531 Weakness: Secondary | ICD-10-CM | POA: Diagnosis not present

## 2022-03-19 DIAGNOSIS — E870 Hyperosmolality and hypernatremia: Secondary | ICD-10-CM | POA: Diagnosis not present

## 2022-03-19 DIAGNOSIS — N179 Acute kidney failure, unspecified: Secondary | ICD-10-CM | POA: Diagnosis not present

## 2022-03-19 LAB — CBC WITH DIFFERENTIAL/PLATELET
Abs Immature Granulocytes: 1.85 K/uL — ABNORMAL HIGH (ref 0.00–0.07)
Basophils Absolute: 0.1 K/uL (ref 0.0–0.1)
Basophils Relative: 1 %
Eosinophils Absolute: 0.3 K/uL (ref 0.0–0.5)
Eosinophils Relative: 3 %
HCT: 42.7 % (ref 36.0–46.0)
Hemoglobin: 13.7 g/dL (ref 12.0–15.0)
Immature Granulocytes: 15 %
Lymphocytes Relative: 14 %
Lymphs Abs: 1.7 K/uL (ref 0.7–4.0)
MCH: 30.8 pg (ref 26.0–34.0)
MCHC: 32.1 g/dL (ref 30.0–36.0)
MCV: 96 fL (ref 80.0–100.0)
Monocytes Absolute: 0.6 K/uL (ref 0.1–1.0)
Monocytes Relative: 5 %
Neutro Abs: 7.6 K/uL (ref 1.7–7.7)
Neutrophils Relative %: 62 %
Platelets: 125 K/uL — ABNORMAL LOW (ref 150–400)
RBC: 4.45 MIL/uL (ref 3.87–5.11)
RDW: 16.1 % — ABNORMAL HIGH (ref 11.5–15.5)
WBC: 12.1 K/uL — ABNORMAL HIGH (ref 4.0–10.5)
nRBC: 0 % (ref 0.0–0.2)

## 2022-03-19 LAB — BASIC METABOLIC PANEL WITH GFR
Anion gap: 5 (ref 5–15)
BUN: 37 mg/dL — ABNORMAL HIGH (ref 8–23)
CO2: 24 mmol/L (ref 22–32)
Calcium: 8.8 mg/dL — ABNORMAL LOW (ref 8.9–10.3)
Chloride: 114 mmol/L — ABNORMAL HIGH (ref 98–111)
Creatinine, Ser: 1.15 mg/dL — ABNORMAL HIGH (ref 0.44–1.00)
GFR, Estimated: 45 mL/min — ABNORMAL LOW
Glucose, Bld: 129 mg/dL — ABNORMAL HIGH (ref 70–99)
Potassium: 5.3 mmol/L — ABNORMAL HIGH (ref 3.5–5.1)
Sodium: 143 mmol/L (ref 135–145)

## 2022-03-19 LAB — MAGNESIUM: Magnesium: 1.6 mg/dL — ABNORMAL LOW (ref 1.7–2.4)

## 2022-03-19 MED ORDER — MAGNESIUM SULFATE 2 GM/50ML IV SOLN
2.0000 g | Freq: Once | INTRAVENOUS | Status: AC
  Administered 2022-03-19: 2 g via INTRAVENOUS
  Filled 2022-03-19: qty 50

## 2022-03-19 MED ORDER — DEXTROSE 5 % IV SOLN: INTRAVENOUS | Status: DC

## 2022-03-19 NOTE — Progress Notes (Signed)
Speech Language Pathology Treatment: Dysphagia  Patient Details Name: Jillian Cantrell MRN: [id number] DOB: 04-18-1927 Today's Date: 03/19/2022 Time: 0865-7846 SLP Time Calculation (min) (ACUTE ONLY): 22 min  Assessment / Plan / Recommendation Clinical Impression  Upon walking into the pt's room, she was with nurse placing IV and was awake.  SLP set up oral suction to attempt oral care with pt as secretions noted on hard palate *at least*. Pt only minimally opens her mouth twice during entire evaluation. She maintains eye closure but does open her eyes when SLP walking around the room throwing away items and setting up suction- suspect some behavioral component.     SLP attempted to administer ice cream, nectar thick applesauce boluses - with pt sealing her lips tightly - indicative of lack of desire to consume.  Was able to place small bolus in oral cavity with pt NOT manipulating bolus.  Oral suction conducted x1 removing viscous white tinged secretions.     Given pt lack of awareness to oral retention of dried secretions - evidenced by her not allowing or conducting oral care (with hand over hand assist).  She is refusing all po intake offered evidenced by tightly closing her lips.     Recommend continue NPO with oral care as pt will allow- prognosis for swallow to be functional level is guarded to poor.  Of note, pt is on dys1/nectar at facility per facility note.  Will follow up next date.     HPI HPI: Patient is a 88 y.o. female with PMH: dementia, HTN, recurrent UTI's, CVA, thyroid cancer post thyroidectomy, hypothyroidism, HLD, mostly bedbound. She arrived at hospital from SNF for AMS and fever. In ED, patient was febrile, tachycardic and almost nonverbal. She was found to have a UTI, mild leukocytosis, hypernatremia. CXR was negative for infiltrates. Per RD note, "Per admission nutrition screen, patient is on a regular diet with nectar thick liquids at nursing facility. She has been eating  poorly d/t decreased appetite and has lost 14-23 lbs recently" per RD notes. Pt has undergone palliative meeting with family for [place].  Family continues to desire to treat and assess for improvement.      SLP Plan  Continue with current plan of care      Recommendations for follow up therapy are one component of a multi-disciplinary discharge planning process, led by the attending physician.  Recommendations may be updated based on patient status, additional functional criteria and insurance authorization.    Recommendations  Diet recommendations: NPO Medication Administration: Via alternative means                Oral Care Recommendations: Oral care QID;Staff/trained caregiver to provide oral care Follow Up Recommendations: Skilled nursing-short term rehab (<3 hours/day) Assistance recommended at discharge: Frequent or constant Supervision/Assistance SLP Visit Diagnosis: Dysphagia, unspecified (R13.10) Plan: Continue with current plan of care          Kathleen Lime, MS [facility] [phone] Pager [phone]  Macario Golds  03/19/2022, 7:46 PM

## 2022-03-19 NOTE — Progress Notes (Signed)
PROGRESS NOTE    Jillian Cantrell  JHE:[id number] DOB: 03-May-1927 DOA: 03/15/2022 PCP: System, Provider Not In   Brief Narrative:  88 y.o. female with medical history significant of dementia currently residing in a facility, mostly bedbound, recurrent UTIs, chronic A-fib, not on anticoagulation because of history of GI bleed, hypertension, CVA, thyroid cancer status post thyroidectomy, hypothyroidism, hyperlipidemia who was sent from SNF for altered mental status and fever despite being on IM Rocephin for the last few days prior to presentation.  On presentation, patient was febrile, tachycardic and almost nonverbal.   She was found to have UTI, mild leukocytosis, hypernatremia.  She was started on IV fluids and antibiotics.  Chest x-ray was negative for infiltrates.  Palliative care has been consulted for goals of care discussion.  Assessment & Plan:   Sepsis: Present on admission UTI -Presented with worsened mental status, fever, tachycardia, tachypnea, leukocytosis and possible UTI.  Patient was on IM Rocephin for the last few days at SNF. -COVID and influenza negative -Initially started on cefepime, Flagyl and vancomycin IV but switched to IV ampicillin on 03/18/2022 as urine culture grew Enterococcus faecalis. -CT renal stone study showed bilateral lead-based staghorn calculi without hydronephrosis or ureteral calculus with scattered colonic diverticuli without acute diverticulitis -Blood cultures negative so far.    Leukocytosis -Improving.  Monitor   Acute metabolic encephalopathy History of dementia, mostly bedbound -Patient normally is bedbound, not very responsive but normally eats okay as per the patient's daughter -She had been more drowsy over the last few days prior to presentation.  Monitor mental status.  CT of the head without contrast was negative for acute intracranial abnormality. -Fall precautions. -Palliative care following. -If condition does not improve in the next  24 to 48 hours, patient will benefit from hospice/comfort measures if family agrees. -SLP following and currently recommend NPO. -Mental status still fluctuating: More lethargic yesterday as per nursing staff.   Chronic A-fib with RVR -Heart rate improving but still tachycardic.  Not on rate controlling agent or anticoagulation as an outpatient.  If blood pressure allows, will use metoprolol.   -Blood pressure improving; start scheduled oral metoprolol if able to swallow  Hypernatremia Dehydration - mild.  Possibly from dehydration.  Improved.  Sodium 143 today.  AKI -creatinine 2.11 on presentation.  Possibly prerenal.  Creatinine 1.15 today.  Continue IV fluids.  Repeat a.m. labs.  Currently dextrose at 75 cc an hour.  Hypomagnesemia -Replace.  Repeat a.m. labs  Hyperkalemia -Mild.  Remove potassium from IV fluids.  Thrombocytopenia -Mild.  No signs of bleeding.   Hypertension -Blood pressure improving.  Monitor.  Antihypertensives on hold       DVT prophylaxis: Heparin Code Status: DNR Family Communication: Daughter in law at bedside Disposition Plan: Status is: Inpatient Remains inpatient appropriate because: Of severity of illness    Consultants: Palliative care  Procedures: None  Antimicrobials:  Anti-infectives (From admission, onward)    Start     Dose/Rate Route Frequency Ordered Stop   03/19/22 2200  vancomycin (VANCOREADY) IVPB 1750 mg/350 mL  Status:  Discontinued        1,750 mg 175 mL/hr over 120 Minutes Intravenous Every 48 hours 03/18/22 1406 03/18/22 1442   03/18/22 1530  ampicillin (OMNIPEN) 2 g in sodium chloride 0.9 % 100 mL IVPB        2 g 300 mL/hr over 20 Minutes Intravenous Every 8 hours 03/18/22 1442     03/17/22 2200  vancomycin (VANCOREADY) IVPB 1500 mg/300 mL  Status:  Discontinued        1,500 mg 150 mL/hr over 120 Minutes Intravenous Every 48 hours 03/17/22 0934 03/18/22 1406   03/16/22 1800  ceFEPIme (MAXIPIME) 2 g in sodium  chloride 0.9 % 100 mL IVPB  Status:  Discontinued        2 g 200 mL/hr over 30 Minutes Intravenous Every 24 hours 03/15/22 1753 03/18/22 1442   03/15/22 1944  vancomycin variable dose per unstable renal function (pharmacist dosing)  Status:  Discontinued         Does not apply See admin instructions 03/15/22 1944 03/17/22 0935   03/15/22 1730  vancomycin (VANCOREADY) IVPB 1500 mg/300 mL        1,500 mg 150 mL/hr over 120 Minutes Intravenous  Once 03/15/22 1719 03/15/22 2152   03/15/22 1715  ceFEPIme (MAXIPIME) 2 g in sodium chloride 0.9 % 100 mL IVPB        2 g 200 mL/hr over 30 Minutes Intravenous  Once 03/15/22 1710 03/15/22 1815   03/15/22 1715  metroNIDAZOLE (FLAGYL) IVPB 500 mg  Status:  Discontinued        500 mg 100 mL/hr over 60 Minutes Intravenous Every 12 hours 03/15/22 1712 03/18/22 1442   03/15/22 1645  piperacillin-tazobactam (ZOSYN) IVPB 3.375 g  Status:  Discontinued        3.375 g 100 mL/hr over 30 Minutes Intravenous  Once 03/15/22 1637 03/15/22 1710   03/15/22 1630  cefTRIAXone (ROCEPHIN) 2 g in sodium chloride 0.9 % 100 mL IVPB  Status:  Discontinued        2 g 200 mL/hr over 30 Minutes Intravenous Every 24 hours 03/15/22 1615 03/15/22 1637        Subjective: Patient seen and examined at bedside.  Nonverbal.  No agitation, seizures, vomiting reported by nursing staff.  Patient was more lethargic yesterday as per nursing staff.   Objective: Vitals:   03/18/22 1707 03/18/22 2101 03/19/22 0103 03/19/22 0501  BP: 117/83 (!) 150/88 (!) 126/94 128/78  Pulse: (!) 103 (!) 109 (!) 102 (!) 108  Resp: '14 18 18 18  '$ Temp: 98 F (36.7 C) (!) 97.5 F (36.4 C) 97.6 F (36.4 C) 97.8 F (36.6 C)  TempSrc: Oral Axillary Axillary   SpO2: 98% 97% 98% 96%  Weight:      Height:        Intake/Output Summary (Last 24 hours) at 03/19/2022 0751 Last data filed at 03/19/2022 0103 Gross per 24 hour  Intake 827.78 ml  Output 850 ml  Net -22.22 ml    Filed Weights   03/16/22 0424  03/17/22 0500 03/18/22 0500  Weight: 77.5 kg 77.6 kg 77.6 kg    Examination:  General: Currently still on room air.  No acute distress.  Elderly female lying in bed.  Chronically ill and deconditioned looking. ENT/neck: No obvious palpable neck masses noted.  JVD is not currently elevated. Respiratory: Decreased breath sounds at bases with scattered crackles, no wheezing  CVS: S1 and S2 heard; tachycardic currently abdominal: Soft, nontender, distended mildly; no organomegaly, normal bowel sounds heard  extremities: Trace lower extremity edema present; no cyanosis  CNS: Wakes up only slightly; does not follow commands.  No focal neurologic deficit.  Moves extremities  lymph: No obvious cervical lymphadenopathy  skin: No obvious ecchymosis/lesions  psych: Could not be assessed because of confusional state.  Not agitated currently  musculoskeletal: No obvious joint swelling/deformity   Data Reviewed: I have personally reviewed following labs and imaging  studies  CBC: Recent Labs  Lab 03/15/22 1403 03/15/22 1822 03/16/22 0255 03/17/22 0702 03/18/22 0259 03/19/22 0408  WBC 11.4* 8.7 9.7 11.4* 13.9* 12.1*  NEUTROABS 9.0*  --   --  8.0* 9.4* 7.6  HGB 15.6* 11.5* 12.4 13.6 13.7 13.7  HCT 47.6* 36.8 39.0 42.8 43.3 42.7  MCV 95.8 99.2 97.7 98.6 98.2 96.0  PLT 153 122* 129* 146* 163 125*    Basic Metabolic Panel: Recent Labs  Lab 03/15/22 1403 03/15/22 1822 03/16/22 0255 03/17/22 0702 03/18/22 0259 03/19/22 0408  NA 153*  --  152* 150* 148* 143  K 4.2  --  3.1* 4.0 4.0 5.3*  CL 124*  --  123* 121* 119* 114*  CO2 20*  --  '24 26 24 24  '$ GLUCOSE 149*  --  121* 157* 131* 129*  BUN 86*  --  75* 63* 47* 37*  CREATININE 2.11* 1.61* 1.68* 1.39* 1.12* 1.15*  CALCIUM 9.6  --  9.1 9.4 9.2 8.8*  MG  --   --  2.2 2.0 1.8 1.6*    GFR: Estimated Creatinine Clearance: 34.2 mL/min (A) (by C-G formula based on SCr of 1.15 mg/dL (H)). Liver Function Tests: Recent Labs  Lab  03/15/22 1403 03/16/22 0255 03/17/22 0702 03/18/22 0259  AST '26 20 20 28  '$ ALT '16 12 13 13  '$ ALKPHOS 178* 141* 151* 157*  BILITOT 1.0 0.6 0.6 0.7  PROT 7.0 5.3* 5.4* 5.9*  ALBUMIN 2.7* 2.0* 2.1* 2.2*    No results for input(s): LIPASE, AMYLASE in the last 168 hours. No results for input(s): AMMONIA in the last 168 hours. Coagulation Profile: Recent Labs  Lab 03/15/22 1403  INR 1.2    Cardiac Enzymes: No results for input(s): CKTOTAL, CKMB, CKMBINDEX, TROPONINI in the last 168 hours. BNP (last 3 results) No results for input(s): PROBNP in the last 8760 hours. HbA1C: No results for input(s): HGBA1C in the last 72 hours. CBG: Recent Labs  Lab 03/15/22 2218  GLUCAP 109*    Lipid Profile: No results for input(s): CHOL, HDL, LDLCALC, TRIG, CHOLHDL, LDLDIRECT in the last 72 hours. Thyroid Function Tests: No results for input(s): TSH, T4TOTAL, FREET4, T3FREE, THYROIDAB in the last 72 hours. Anemia Panel: No results for input(s): VITAMINB12, FOLATE, FERRITIN, TIBC, IRON, RETICCTPCT in the last 72 hours. Sepsis Labs: Recent Labs  Lab 03/15/22 1402 03/15/22 1800 03/16/22 0025 03/16/22 0255  LATICACIDVEN 1.5 1.7 1.8 1.8     Recent Results (from the past 240 hour(s))  Culture, blood (Routine x 2)     Status: None (Preliminary result)   Collection Time: 03/15/22  2:03 PM   Specimen: BLOOD  Result Value Ref Range Status   Specimen Description   Final    BLOOD BLOOD RIGHT WRIST Performed at [facility] [address] [place], [place] [address]    Special Requests   Final    BOTTLES DRAWN AEROBIC AND ANAEROBIC Blood Culture results may not be optimal due to an inadequate volume of blood received in culture bottles Performed at [facility] [address] [place], [place] [address]    Culture   Final    NO GROWTH 3 DAYS Performed at [facility], [place] [address], [place], [place] [address]    Report Status PENDING   Incomplete  Urine Culture     Status: Abnormal   Collection Time: 03/15/22  2:03 PM   Specimen: Urine, Catheterized  Result Value Ref Range Status  Specimen Description   Final    URINE, CATHETERIZED Performed at [facility], [place] [address], [place], [place] [address]    Special Requests   Final    NONE Performed at [facility], [place] [address] [place], [place] [address]    Culture >=100,000 COLONIES/mL ENTEROCOCCUS FAECALIS (A)  Final   Report Status 03/18/2022 FINAL  Final   Organism ID, Bacteria ENTEROCOCCUS FAECALIS (A)  Final      Susceptibility   Enterococcus faecalis - MIC*    AMPICILLIN <=2 SENSITIVE Sensitive     NITROFURANTOIN <=16 SENSITIVE Sensitive     VANCOMYCIN 1 SENSITIVE Sensitive     * >=100,000 COLONIES/mL ENTEROCOCCUS FAECALIS  Culture, blood (Routine x 2)     Status: None (Preliminary result)   Collection Time: 03/15/22  2:12 PM   Specimen: BLOOD  Result Value Ref Range Status   Specimen Description   Final    BLOOD RIGHT ANTECUBITAL Performed at [facility] [address] [place], [place] [address]    Special Requests   Final    BOTTLES DRAWN AEROBIC AND ANAEROBIC Blood Culture results may not be optimal due to an inadequate volume of blood received in culture bottles Performed at [facility] [address], [place], [place] [address]    Culture   Final    NO GROWTH 3 DAYS Performed at [facility], [place] [address] [place], [place] [address]    Report Status PENDING  Incomplete  SARS Coronavirus 2 by RT PCR (hospital order, performed in [facility] hospital lab) *cepheid single result test* Anterior Nasal Swab     Status: None   Collection Time: 03/15/22  4:45 PM   Specimen: Anterior Nasal Swab  Result Value Ref Range Status   SARS Coronavirus 2 by RT PCR NEGATIVE NEGATIVE Final    Comment: (NOTE) SARS-CoV-2 target nucleic acids are NOT DETECTED.  The  SARS-CoV-2 RNA is generally detectable in upper and lower respiratory specimens during the acute phase of infection. The lowest concentration of SARS-CoV-2 viral copies this assay can detect is 250 copies / mL. A negative result does not preclude SARS-CoV-2 infection and should not be used as the sole basis for treatment or other patient management decisions.  A negative result may occur with improper specimen collection / handling, submission of specimen other than nasopharyngeal swab, presence of viral mutation(s) within the areas targeted by this assay, and inadequate number of viral copies (<250 copies / mL). A negative result must be combined with clinical observations, patient history, and epidemiological information.  Fact Sheet for Patients:   https://www.patel.info/  Fact Sheet for Healthcare Providers: https://hall.com/  This test is not yet approved or  cleared by the [place] FDA and has been authorized for detection and/or diagnosis of SARS-CoV-2 by FDA under an Emergency Use Authorization (EUA).  This EUA will remain in effect (meaning this test can be used) for the duration of the COVID-19 declaration under Section 564(b)(1) of the Act, 21 U.S.C. section 360bbb-3(b)(1), unless the authorization is terminated or revoked sooner.  Performed at [facility], [place] [address], [place], [place] [address]   MRSA Next Gen by PCR, Nasal     Status: None   Collection Time: 03/15/22 11:01 PM   Specimen: Nasal Mucosa; Nasal Swab  Result Value Ref Range Status   MRSA by PCR Next Gen NOT DETECTED NOT DETECTED Final    Comment: (NOTE) The GeneXpert MRSA  Assay (FDA approved for NASAL specimens only), is one component of a comprehensive MRSA colonization surveillance program. It is not intended to diagnose MRSA infection nor to guide or monitor treatment for MRSA infections. Test performance is not FDA approved in  patients less than 69 years old. Performed at [facility], [place] [address] [place], [place] [address]           Radiology Studies: No results found.      Scheduled Meds:  Chlorhexidine Gluconate Cloth  6 each Topical Daily   heparin  5,000 Units Subcutaneous Q8H   Continuous Infusions:  ampicillin (OMNIPEN) IV 2 g (03/19/22 0734)   dextrose 75 mL/hr at 03/19/22 0730          Nilah Belcourt Starla Link, MD [facility] 03/19/2022, 7:51 AM

## 2022-03-19 NOTE — Progress Notes (Signed)
Daily Progress Note   Patient Name: Jillian Cantrell       Date: 03/19/2022 DOB: Jun 14, 1927  Age: 88 y.o. MRN#: [id number] Attending Physician: Aline August, MD Primary Care Physician: System, Provider Not In Admit Date: 03/15/2022  Reason for Consultation/Follow-up: Establishing goals of care  Subjective: Patient is resting in bed, does not verbalize, family at bedside.   Discussed on phone with daughter regarding hospice.      Length of Stay: 4  Current Medications: Scheduled Meds:   Chlorhexidine Gluconate Cloth  6 each Topical Daily   heparin  5,000 Units Subcutaneous Q8H    Continuous Infusions:  ampicillin (OMNIPEN) IV 2 g (03/19/22 0734)   dextrose 75 mL/hr at 03/19/22 0730    PRN Meds: acetaminophen **OR** acetaminophen, fentaNYL (SUBLIMAZE) injection, metoprolol tartrate, ondansetron **OR** ondansetron (ZOFRAN) IV  Physical Exam         General: Lying in bed with eyes open.      does not verbalize or otherwise interact Heart: Tachycardic, regular. Lungs: Fair air movement  Ext: No significant edema Skin: Warm and dry  Vital Signs: BP 128/78 (BP Location: Right Arm)   Pulse (!) 108   Temp 97.8 F (36.6 C)   Resp 18   Ht '5\' 8"'$  (1.727 m)   Wt 77.6 kg   SpO2 96%   BMI 26.01 kg/m  SpO2: SpO2: 96 % O2 Device: O2 Device: Room Air O2 Flow Rate: O2 Flow Rate (L/min): 1 L/min  Intake/output summary:  Intake/Output Summary (Last 24 hours) at 03/19/2022 1136 Last data filed at 03/19/2022 0103 Gross per 24 hour  Intake 827.78 ml  Output 850 ml  Net -22.22 ml    LBM: Last BM Date : 03/18/22 Baseline Weight: Weight: 81.6 kg Most recent weight: Weight: 77.6 kg       Palliative Assessment/Data:    Flowsheet Rows    Flowsheet Row Most Recent Value  Intake  Tab   Referral Department Hospitalist  Unit at Time of Referral ICU  Palliative Care Primary Diagnosis Sepsis/Infectious Disease  Date Notified 03/15/22  Palliative Care Type New Palliative care  Reason for referral Clarify Goals of Care  Date of Admission 03/15/22  Date first seen by Palliative Care 03/17/22  # of days Palliative referral response time 2 Day(s)  # of days IP prior  to Palliative referral 0  Clinical Assessment   Palliative Performance Scale Score 20%  Psychosocial & Spiritual Assessment   Palliative Care Outcomes   Patient/Family meeting held? Yes  Who was at the meeting? 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 female  with past medical history of dementia, long-term skilled facility resident, mostly bedbound, recurrent UTIs, chronic A-fib not on anticoagulation due to history of GI bleed, hypertension, CVA, thyroid cancer status post thyroidectomy, hypothyroidism, hyperlipidemia admitted on 03/15/2022 with altered mental status and fever despite being on IM Rocephin due to concern for UTI at  facility.  Admission work-up revealed sepsis, acute encephalopathy, and AKI.  She was also tachycardic with known history of A-fib with RVR and also found to be hyponatremic.  Mental status has not really improved to this point.  Palliative consulted for goals of care.  Recommendations/Plan: DNR/DNI No feeding tube Recommend residential hospice. Discussed with family in the room as well as with daughter on the phone. They have expressed a preference for hospice in [facility].   Code Status:    Code Status Orders  (From admission, onward)           Start     Ordered   03/15/22 1656  Do not attempt resuscitation (DNR)  Continuous       Question Answer Comment  In the event of cardiac or respiratory ARREST Do not call a "code blue"   In the event of cardiac or respiratory ARREST Do not perform Intubation, CPR, defibrillation or ACLS   In the event of cardiac or respiratory ARREST Use medication by any route, position, wound care, and other measures to relive pain and suffering. May use oxygen, suction and manual treatment of airway obstruction as needed for comfort.      03/15/22 1657           Code Status History     Date Active Date Inactive Code Status Order ID Comments User Context   02/24/2017 1735 02/25/2017 2007 DNR [id number]  Nita Sells, MD Inpatient   05/30/2016 1728 06/01/2016 1727 Full Code [id number]  Kelvin Cellar, MD Inpatient   11/01/2015 1119 11/07/2015 1611 Full Code [id number]  Waldemar Dickens, MD ED      Advance Directive Documentation    [place] Most Recent Value  Type of Advance Directive Out of facility DNR (pink MOST or yellow form)  Pre-existing out of facility DNR order (yellow form or pink MOST form) --  "MOST" Form in Place? --       Prognosis: Less than 2 weeks.   Discharge Planning:  Residential hospice.   Care plan was discussed with daughter  Thank you for allowing the Palliative Medicine Team to assist in the care  of this patient.  Loistine Chance, MD  Please contact Palliative Medicine Team phone at [phone] for questions and concerns.

## 2022-03-19 NOTE — Progress Notes (Signed)
SLP visit with pt full report to follow.    Upon walking into the pt's room, she was with nurse placing IV and was awake.  SLP set up oral suction to attempt oral care with pt as secretions noted on hard palate *at least*. Pt only minimally opens her mouth twice during entire evaluation. She maintains eye closure but does open her eyes when SLP walking around the room throwing away items and setting up suction- suspect some behavioral component.    SLP attempted to administer ice cream, nectar thick applesauce boluses - with pt sealing her lips tightly - indicative of lack of desire to consume.  Was able to place small bolus in oral cavity with pt NOT manipulating bolus.  Oral suction conducted x1 removing viscous white tinged secretions.    Given pt lack of awareness to oral retention of dried secretions - evidenced by her not allowing or conducting oral care (with hand over hand assist).  She is refusing all po intake offered evidenced by tightly closing her lips.    Recommend continue NPO with oral care as pt will allow- prognosis for swallow to be functional level is guarded to poor.  Of note, pt is on dys1/nectar at facility.  Will follow up next date.    Kathleen Lime, MS [facility] SLP Acute Rehab Services Office [phone] Pager [phone]

## 2022-03-19 NOTE — Progress Notes (Addendum)
SLP Discharge Note  Patient Details Name: CYRENA KUCHENBECKER MRN: [id number] DOB: [birth year]   :       Reason Eval/Treat Not Completed: Other (comment) (Pt now to dc to hospice and per Dr Inda Castle note, no change in mentation observed, will sign off. Thanks.)  Kathleen Lime, MS [facility] SLP Acute Rehab Services Office [phone] Pager [phone]  Macario Golds 03/19/2022, 11:48 AM

## 2022-03-19 NOTE — TOC Initial Note (Signed)
Transition of Care [facility]) - Initial/Assessment Note    Patient Details  Name: Jillian Cantrell MRN: [id number] Date of Birth: 05-27-1927  [facility]) CM/SW Contact:    Leeroy Cha, RN Phone Number: 03/19/2022, 9:00 AM  Clinical Narrative:                 First time seeing patient Family deciding on palliative or hospice care.  Is from [facility] where she is a lt patient.  Expected Discharge Plan: Long Term Nursing Home Barriers to Discharge: Continued Medical Work up   Patient Goals and CMS Choice Patient states their goals for this hospitalization and ongoing recovery are:: unable to state family is looking into eol goals CMS Medicare.gov Compare Post Acute Care list provided to:: Patient Represenative (must comment) (daughter) Choice offered to / list presented to : Adult Children  Expected Discharge Plan and Services Expected Discharge Plan: [place]   Discharge Planning Services: CM Consult   Living arrangements for the past 2 months: [place]                                      Prior Living Arrangements/Services Living arrangements for the past 2 months: [place] Lives with:: Facility Resident Patient language and need for interpreter reviewed:: Yes              Criminal Activity/Legal Involvement Pertinent to Current Situation/Hospitalization: No - Comment as needed  Activities of Daily Living Home Assistive Devices/Equipment: [employer] fast streamer, Wheelchair ADL Screening (condition at time of admission) Patient's cognitive ability adequate to safely complete daily activities?: No Is the patient deaf or have difficulty hearing?: Yes Does the patient have difficulty seeing, even when wearing glasses/contacts?: Yes Does the patient have difficulty concentrating, remembering, or making decisions?: Yes Patient able to express need for assistance with ADLs?: Yes Does the patient have  difficulty dressing or bathing?: Yes Independently performs ADLs?: No Communication: Needs assistance Is this a change from baseline?: Pre-admission baseline Dressing (OT): Needs assistance Is this a change from baseline?: Pre-admission baseline Grooming: Needs assistance Is this a change from baseline?: Pre-admission baseline Feeding: Needs assistance Is this a change from baseline?: Pre-admission baseline Bathing: Needs assistance Is this a change from baseline?: Pre-admission baseline Toileting: Needs assistance Is this a change from baseline?: Pre-admission baseline In/Out Bed: Needs assistance Is this a change from baseline?: Pre-admission baseline Walks in Home: Dependent Is this a change from baseline?: Pre-admission baseline Does the patient have difficulty walking or climbing stairs?: Yes Weakness of Legs: Both Weakness of Arms/Hands: Both  Permission Sought/Granted                  Emotional Assessment Appearance:: Appears stated age Attitude/Demeanor/Rapport: Inconsistent Affect (typically observed): Calm Orientation: : Oriented to Place, Oriented to Self, Oriented to Situation Alcohol / Substance Use: Not Applicable Psych Involvement: No (comment)  Admission diagnosis:  UTI (urinary tract infection) [N39.0] Sepsis, due to unspecified organism, unspecified whether acute organ dysfunction present [facility]) [A41.9] Patient Active Problem List   Diagnosis Date Noted   Thrombocytopenia ([place]) 03/16/2022   UTI (urinary tract infection) 03/15/2022   Sepsis ([place]) 03/15/2022   Hypernatremia 03/15/2022   AKI (acute kidney injury) ([place]) 03/15/2022   Leukocytosis 03/15/2022   Weakness 02/24/2017   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   UTI (lower urinary tract infection) 05/30/2016   Atrial fibrillation with  normal ventricular rate (HCC)    Thyroid disease    Iatrogenic hypothyroidism    Atrial fibrillation ([place])    Hypothyroidism 11/05/2015   Rectal bleeding    Acute  blood loss anemia 11/02/2015   GI bleed 11/01/2015   Fall 11/01/2015   Physical deconditioning 11/01/2015   Atrial fibrillation, new onset ([place]) 11/01/2015   Hypotension 11/01/2015   History of CVA (cerebrovascular accident) 11/01/2015   HLD (hyperlipidemia)    PCP:  System, Provider Not In Pharmacy:   [id number] [place], [id number]: [phone]: [phone]    Social Determinants of Health (SDOH) Interventions    Readmission Risk Interventions     View : No data to display.

## 2022-03-20 DIAGNOSIS — Z7189 Other specified counseling: Secondary | ICD-10-CM | POA: Diagnosis not present

## 2022-03-20 DIAGNOSIS — Z515 Encounter for palliative care: Secondary | ICD-10-CM | POA: Diagnosis not present

## 2022-03-20 DIAGNOSIS — R531 Weakness: Secondary | ICD-10-CM | POA: Diagnosis not present

## 2022-03-20 LAB — CULTURE, BLOOD (ROUTINE X 2)
Culture: NO GROWTH
Culture: NO GROWTH

## 2022-03-20 LAB — BASIC METABOLIC PANEL
Anion gap: 7 (ref 5–15)
BUN: 27 mg/dL — ABNORMAL HIGH (ref 8–23)
CO2: 20 mmol/L — ABNORMAL LOW (ref 22–32)
Calcium: 8.6 mg/dL — ABNORMAL LOW (ref 8.9–10.3)
Chloride: 111 mmol/L (ref 98–111)
Creatinine, Ser: 0.99 mg/dL (ref 0.44–1.00)
GFR, Estimated: 53 mL/min — ABNORMAL LOW (ref >60)
Glucose, Bld: 122 mg/dL — ABNORMAL HIGH (ref 70–99)
Potassium: 4.2 mmol/L (ref 3.5–5.1)
Sodium: 138 mmol/L (ref 135–145)

## 2022-03-20 LAB — CBC WITH DIFFERENTIAL/PLATELET
Abs Immature Granulocytes: 0.9 10*3/uL — ABNORMAL HIGH (ref 0.00–0.07)
Band Neutrophils: 1 %
Basophils Absolute: 0.1 10*3/uL (ref 0.0–0.1)
Basophils Relative: 1 %
Eosinophils Absolute: 0.1 10*3/uL (ref 0.0–0.5)
Eosinophils Relative: 1 %
HCT: 39.2 % (ref 36.0–46.0)
Hemoglobin: 12.8 g/dL (ref 12.0–15.0)
Lymphocytes Relative: 10 %
Lymphs Abs: 1.2 10*3/uL (ref 0.7–4.0)
MCH: 31.3 pg (ref 26.0–34.0)
MCHC: 32.7 g/dL (ref 30.0–36.0)
MCV: 95.8 fL (ref 80.0–100.0)
Metamyelocytes Relative: 4 %
Monocytes Absolute: 0 10*3/uL — ABNORMAL LOW (ref 0.1–1.0)
Monocytes Relative: 0 %
Myelocytes: 3 %
Neutro Abs: 10 10*3/uL — ABNORMAL HIGH (ref 1.7–7.7)
Neutrophils Relative %: 80 %
Platelets: 171 10*3/uL (ref 150–400)
RBC: 4.09 MIL/uL (ref 3.87–5.11)
RDW: 16 % — ABNORMAL HIGH (ref 11.5–15.5)
WBC: 12.3 10*3/uL — ABNORMAL HIGH (ref 4.0–10.5)
nRBC: 0 % (ref 0.0–0.2)

## 2022-03-20 LAB — MAGNESIUM: Magnesium: 2.1 mg/dL (ref 1.7–2.4)

## 2022-03-20 MED ORDER — METOPROLOL TARTRATE 5 MG/5ML IV SOLN
2.5000 mg | Freq: Two times a day (BID) | INTRAVENOUS | Status: AC
  Administered 2022-03-20 – 2022-03-21 (×3): 2.5 mg via INTRAVENOUS
  Filled 2022-03-20 (×3): qty 5

## 2022-03-20 NOTE — Progress Notes (Signed)
Daily Progress Note   Patient Name: Jillian Cantrell       Date: 03/20/2022 DOB: [birth year]  Age: 88 y.o. MRN#: [id number] Attending Physician: Florencia Reasons, MD Primary Care Physician: System, Provider Not In Admit Date: 03/15/2022  Reason for Consultation/Follow-up: Establishing goals of care  Subjective: Patient is resting in bed, does not verbalize but is more interactive today, eyes open, grandsons at bedside.  Discussed about scope of current hospitalization and broad goals of care.   Length of Stay: 5  Current Medications: Scheduled Meds:   Chlorhexidine Gluconate Cloth  6 each Topical Daily   heparin  5,000 Units Subcutaneous Q8H    Continuous Infusions:  ampicillin (OMNIPEN) IV 2 g (03/20/22 6195)   dextrose 75 mL/hr at 03/20/22 0738    PRN Meds: acetaminophen **OR** acetaminophen, fentaNYL (SUBLIMAZE) injection, metoprolol tartrate, ondansetron **OR** ondansetron (ZOFRAN) IV  Physical Exam         General: Lying in bed with eyes open.      does not verbalize    Interacts some with family at bedside.  Heart: Tachycardic, regular. Lungs: Fair air movement  Ext: No significant edema Skin: Warm and dry  Vital Signs: BP 107/80   Pulse (!) 112   Temp 97.7 F (36.5 C)   Resp 18   Ht '5\' 8"'$  (1.727 m)   Wt 77.6 kg   SpO2 95%   BMI 26.01 kg/m  SpO2: SpO2: 95 % O2 Device: O2 Device: Room Air O2 Flow Rate: O2 Flow Rate (L/min): 1 L/min  Intake/output summary:  Intake/Output Summary (Last 24 hours) at 03/20/2022 1126 Last data filed at 03/20/2022 0932 Gross per 24 hour  Intake 2125.35 ml  Output 600 ml  Net 1525.35 ml    LBM: Last BM Date : 03/19/22 Baseline Weight: Weight: 81.6 kg Most recent weight: Weight: 77.6 kg       Palliative Assessment/Data:    Flowsheet  Rows    Flowsheet Row Most Recent Value  Intake Tab   Referral Department Hospitalist  Unit at Time of Referral ICU  Palliative Care Primary Diagnosis Sepsis/Infectious Disease  Date Notified 03/15/22  Palliative Care Type New Palliative care  Reason for referral Clarify Goals of Care  Date of Admission 03/15/22  Date first seen by Palliative Care 03/17/22  # of days Palliative referral  response time 2 Day(s)  # of days IP prior to Palliative referral 0  Clinical Assessment   Palliative Performance Scale Score 20%  Psychosocial & Spiritual Assessment   Palliative Care Outcomes   Patient/Family meeting held? Yes  Who was at the meeting? 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 female  with past medical history of dementia, long-term skilled facility resident, mostly bedbound, recurrent UTIs, chronic A-fib not on anticoagulation due to history of GI bleed, hypertension, CVA, thyroid cancer status post thyroidectomy,  hypothyroidism, hyperlipidemia admitted on 03/15/2022 with altered mental status and fever despite being on IM Rocephin due to concern for UTI at facility.  Admission work-up revealed sepsis, acute encephalopathy, and AKI.  She was also tachycardic with known history of A-fib with RVR and also found to be hyponatremic.  Mental status has not really improved to this point.  Palliative consulted for goals of care.  Recommendations/Plan: DNR/DNI No feeding tube Goals of care discussions undertaken again with family at bedside.  Family wishes for continuation of current scope of care, repeated SLP evaluations, continuation of antibiotics for at least the next 24 to 48 hours.  Palliative medicine team continuing to follow along for additional support and for determining best disposition options.  1/option that is being discussed is to residential hospice.  Continue monitoring of hospital disease trajectory for now.  Palliative medicine team to follow.   Code Status:    Code Status Orders  (From admission, onward)           Start     Ordered   03/15/22 1656  Do not attempt resuscitation (DNR)  Continuous       Question Answer Comment  In the event of cardiac or respiratory ARREST Do not call a "code blue"   In the event of cardiac or respiratory ARREST Do not perform Intubation, CPR, defibrillation or ACLS   In the event of cardiac or respiratory ARREST Use medication by any route, position, wound care, and other measures to relive pain and suffering. May use oxygen, suction and manual treatment of airway obstruction as needed for comfort.      03/15/22 1657           Code Status History     Date Active Date Inactive Code Status Order ID Comments User Context   02/24/2017 1735 02/25/2017 2007 DNR [id number]  Nita Sells, MD Inpatient   05/30/2016 1728 06/01/2016 1727 Full Code [id number]  Kelvin Cellar, MD Inpatient  11/01/2015 1119 11/07/2015 1611 Full Code [id number]  Waldemar Dickens, MD ED      Advance Directive Documentation    Flowsheet Row Most Recent Value  Type of Advance Directive Out of facility DNR (pink MOST or yellow form)  Pre-existing out of facility DNR order (yellow form or pink MOST form) --  "MOST" Form in Place? --       Prognosis: Likely less than 2 weeks.   Discharge Planning: To be determined.    Care plan was discussed with family at bedside.   Thank you for allowing the Palliative Medicine Team to assist in the care of this patient.  Loistine Chance, MD  Please contact Palliative Medicine Team phone at [phone] for questions and concerns.

## 2022-03-20 NOTE — Progress Notes (Signed)
Patient's daughter Jillian Cantrell called to request update for patient and update from Attending Provider. Notified Dr. Erlinda Hong that family was calling requesting an update.  Call back number for Pamela/ [phone] provided for Dr. Erlinda Hong    When nursing staff providing pericare for patient, urine noted on bedpad. Patient has indwelling urethral catheter, noted to have leaking urine from around the balloon. Dr. Erlinda Hong notified.

## 2022-03-20 NOTE — Progress Notes (Signed)
PROGRESS NOTE    Jillian Cantrell  ACZ:[id number] DOB: 06-Jan-1927 DOA: 03/15/2022 PCP: System, Provider Not In     Brief Narrative:   h/o dementia currently residing in a facility, mostly bedbound, recurrent UTIs, chronic A-fib, not on anticoagulation because of history of GI bleed,  CVA with right hemiplegia, h/o  thyroid cancer status post thyroidectomy, hypothyroidism, who was sent from SNF for altered mental status and fever   Subjective:  Alert, nonverbal RN reports foley was leaking this morning, so a new foley was reinserted   Assessment & Plan:  Principal Problem:   UTI (urinary tract infection) Active Problems:   Atrial fibrillation (HCC)   Sepsis ([place])   Hypernatremia   AKI (acute kidney injury) ([place])   Leukocytosis   Thrombocytopenia ([place])   Palliative care by specialist   Goals of care, counseling/discussion   General weakness    Assessment and Plan:   Sepsis: Present on admission Recurrent UTI/acute metabolic encephalopathy -Presented with worsened mental status, fever, tachycardia, tachypnea, leukocytosis and possible UTI.  Patient was on IM Rocephin for the last few days at SNF. -COVID and influenza negative -Initially started on cefepime, Flagyl and vancomycin IV but switched to IV ampicillin on 03/18/2022 as urine culture grew Enterococcus faecalis. -CT renal stone study showed bilateral renal pelvis staghorn calculi without hydronephrosis or ureteral calculus with scattered colonic diverticuli without acute diverticulitis -Blood cultures negative so far.       Acute metabolic encephalopathy History of dementia, mostly bedbound -Patient normally is bedbound, not very responsive but normally eats okay as per the patient's daughter -She had been more drowsy over the last few days prior to presentation.  Monitor mental status.  CT of the head without contrast was negative for acute intracranial abnormality. -Fall precautions. -Palliative care  following. -If condition does not improve in the next 24 to 48 hours, patient will benefit from hospice/comfort measures if family agrees. -SLP following and currently recommend NPO. -Mental status still fluctuating: More lethargic yesterday as per nursing staff.   Chronic A-fib with RVR -Heart rate improving but still tachycardic.  Not on rate controlling agent or anticoagulation as an outpatient. Start low dose  iv lopressor as she is not able to swallow pills.      Hypernatremia Dehydration aki - all improved on hydration, however, she is not safe to take oral intake , will follow speech eval, family does not plan for feeding tube     Hypomagnesemia -Replaced, improved    Hyperkalemia -Mild.  Remove potassium from IV fluids. K normalized   Thrombocytopenia -plt normal today   Hypertension -Blood pressure improving.  Monitor.  Antihypertensives on hold since admission, start low dose iv lopressor monitor effect         Nutritional Assessment: The patient's BMI is: Body mass index is 26.01 kg/m.[place] Kitchen Seen by dietician.  I agree with the assessment and plan as outlined below: Nutrition Status: Nutrition Problem: Inadequate oral intake Etiology: inability to eat Signs/Symptoms: NPO status Interventions: Refer to RD note for recommendations  .   I have Reviewed nursing notes, Vitals, pain scores, I/o's, Lab results and  imaging results since pt's last encounter, details please see discussion above  I ordered the following labs:  Unresulted Labs (From admission, onward)     Start     Ordered   Pending  Comprehensive metabolic panel  Once,   R        Pending   Pending  Lactic acid, plasma  Now then  every 2 hours,   R      Pending   Pending  CBC with Differential  Once,   R        Pending   Pending  Protime-INR  Once,   R        Pending   Pending  Culture, blood (Routine x 2)  BLOOD CULTURE X 2,   R      Pending   Pending  Urinalysis, Routine w reflex microscopic   Once,   R        Pending             DVT prophylaxis: heparin injection 5,000 Units Start: 03/15/22 2200   Code Status:   Code Status: DNR  Family Communication: daughter over the phone  Disposition:    Dispo: The patient is from: snf              Anticipated d/c is to: TBD              Anticipated d/c date is: TBD  Antimicrobials:    Anti-infectives (From admission, onward)    Start     Dose/Rate Route Frequency Ordered Stop   03/19/22 2200  vancomycin (VANCOREADY) IVPB 1750 mg/350 mL  Status:  Discontinued        1,750 mg 175 mL/hr over 120 Minutes Intravenous Every 48 hours 03/18/22 1406 03/18/22 1442   03/18/22 1530  ampicillin (OMNIPEN) 2 g in sodium chloride 0.9 % 100 mL IVPB        2 g 300 mL/hr over 20 Minutes Intravenous Every 8 hours 03/18/22 1442 03/21/22 2359   03/17/22 2200  vancomycin (VANCOREADY) IVPB 1500 mg/300 mL  Status:  Discontinued        1,500 mg 150 mL/hr over 120 Minutes Intravenous Every 48 hours 03/17/22 0934 03/18/22 1406   03/16/22 1800  ceFEPIme (MAXIPIME) 2 g in sodium chloride 0.9 % 100 mL IVPB  Status:  Discontinued        2 g 200 mL/hr over 30 Minutes Intravenous Every 24 hours 03/15/22 1753 03/18/22 1442   03/15/22 1944  vancomycin variable dose per unstable renal function (pharmacist dosing)  Status:  Discontinued         Does not apply See admin instructions 03/15/22 1944 03/17/22 0935   03/15/22 1730  vancomycin (VANCOREADY) IVPB 1500 mg/300 mL        1,500 mg 150 mL/hr over 120 Minutes Intravenous  Once 03/15/22 1719 03/15/22 2152   03/15/22 1715  ceFEPIme (MAXIPIME) 2 g in sodium chloride 0.9 % 100 mL IVPB        2 g 200 mL/hr over 30 Minutes Intravenous  Once 03/15/22 1710 03/15/22 1815   03/15/22 1715  metroNIDAZOLE (FLAGYL) IVPB 500 mg  Status:  Discontinued        500 mg 100 mL/hr over 60 Minutes Intravenous Every 12 hours 03/15/22 1712 03/18/22 1442   03/15/22 1645  piperacillin-tazobactam (ZOSYN) IVPB 3.375 g  Status:   Discontinued        3.375 g 100 mL/hr over 30 Minutes Intravenous  Once 03/15/22 1637 03/15/22 1710   03/15/22 1630  cefTRIAXone (ROCEPHIN) 2 g in sodium chloride 0.9 % 100 mL IVPB  Status:  Discontinued        2 g 200 mL/hr over 30 Minutes Intravenous Every 24 hours 03/15/22 1615 03/15/22 1637          Objective: Vitals:   03/20/22 7893 03/20/22 8101 03/20/22 7510 03/20/22 1249  BP:  107/80  129/77  Pulse: (!) 131  (!) 112 (!) 109  Resp:    18  Temp:    98.4 F (36.9 C)  TempSrc:    Oral  SpO2:    97%  Weight:      Height:        Intake/Output Summary (Last 24 hours) at 03/20/2022 1421 Last data filed at 03/20/2022 0102 Gross per 24 hour  Intake 2125.35 ml  Output 600 ml  Net 1525.35 ml   Filed Weights   03/16/22 0424 03/17/22 0500 03/18/22 0500  Weight: 77.5 kg 77.6 kg 77.6 kg    Examination:  General exam: appear nonverbal, but does response to questions by gesturing, right hemiplegia, able to move left extremity some Respiratory system: Clear to auscultation. Respiratory effort normal. Cardiovascular system:  IRRR.  Gastrointestinal system: Abdomen is nondistended, soft and nontender.  Normal bowel sounds heard. Central nervous system: Alert , nonverbal,  Extremities:  no edema Skin: No rashes, lesions or ulcers Psychiatry: alert, non verbal, currently on agitation     Data Reviewed: I have personally reviewed  labs and visualized  imaging studies since the last encounter and formulate the plan        Scheduled Meds:  Chlorhexidine Gluconate Cloth  6 each Topical Daily   heparin  5,000 Units Subcutaneous Q8H   Continuous Infusions:  ampicillin (OMNIPEN) IV 2 g (03/20/22 7253)   dextrose 75 mL/hr at 03/20/22 1157     LOS: 5 days     Florencia Reasons, MD PhD FACP [facility]  Available via Epic secure chat 7am-7pm for nonurgent issues Please page for urgent issues To page the attending provider between 7A-7P or the covering provider during  after hours 7P-7A, please log into the web site [email or web address] and access using universal [place] password for that web site. If you do not have the password, please call the hospital operator.    03/20/2022, 2:21 PM

## 2022-03-20 NOTE — Care Management Important Message (Signed)
Important Message  Patient Details IM Letter placed in Patients room. Name: Jillian Cantrell MRN: [id number] Date of Birth: December 17, 1926   Medicare Important Message Given:  Yes     Kerin Salen 03/20/2022, 1:00 PM

## 2022-03-20 NOTE — Plan of Care (Signed)
  Problem: Urinary Elimination: Goal: Signs and symptoms of infection will decrease Outcome: Progressing   Problem: Elimination: Goal: Will not experience complications related to bowel motility Outcome: Progressing Goal: Will not experience complications related to urinary retention Outcome: Progressing   Problem: Pain Managment: Goal: General experience of comfort will improve Outcome: Progressing

## 2022-03-20 NOTE — Progress Notes (Signed)
  Speech Language Pathology Treatment: Dysphagia  Patient Details Name: Jillian Cantrell MRN: [id number] DOB: April 04, 1927 Today's Date: 03/20/2022 Time: 2263-3354 SLP Time Calculation (min) (ACUTE ONLY): 29 min  Assessment / Plan / Recommendation Clinical Impression  Per RN request, SLP to reassess pt.  Today pt is alert, willing to allow oral care.  Oral examination completed *yesterday pt would not open her mouth* revealing dried secretions on tongue, soft palate and posterior pharynx. Pt is not closing her lips adequately to swish and expectorate with warm water and when attempting posterior oral care, she is gagging and turning away.  She does briefly close her lips when place small amount of warm water in anterior oral cavity with total cues - but then opens them and does not attempt oral manipulation.   SLP was able to remove anterior secretion from tongue approx dime sized with significant effort.    Unfortunately, this is likely not new secretion retention and clearly is not comfortable for this pt. Pt with open mouth posture at baseline- contributing to drying of secretions retained *that pt is not aware of nor attempts to clear independently.   Messaged MD inquiring if medication/solution can be provided to help loosen dried secretions from posterior oral cavity without causing harm to oral tissue that is already agitated.  Despite pt's best efforts, she is not sealing her lips adequately nor swishing or expectorating with tsp amounts of water.  Until and if oral cavity can be fully cleaned AND pt will initiate swallow, SLP treatment not indicated.  Posted picture in media, spoke to RN and messaged MDs regarding concerns.  Will follow from afar to determine when/if pt may be ready for swallow evaluation/po intake.  Thanks.    HPI HPI: Patient is a 88 y.o. female with PMH: dementia, HTN, recurrent UTI's, CVA, thyroid cancer post thyroidectomy, hypothyroidism, HLD, mostly bedbound. She arrived  at hospital from SNF for AMS and fever. In ED, patient was febrile, tachycardic and almost nonverbal. She was found to have a UTI, mild leukocytosis, hypernatremia. CXR was negative for infiltrates. Per RD note, "Per admission nutrition screen, patient is on a regular diet with nectar thick liquids at nursing facility. She has been eating poorly d/t decreased appetite and has lost 14-23 lbs recently" per RD notes. Pt has undergone palliative meeting with family for [place].  Family continues to desire to treat and assess for improvement.      SLP Plan  Other (Comment)      Recommendations for follow up therapy are one component of a multi-disciplinary discharge planning process, led by the attending physician.  Recommendations may be updated based on patient status, additional functional criteria and insurance authorization.    Recommendations  Liquids provided via: Teaspoon (warm water to help facilitate oral care, clearance) Medication Administration: Via alternative means                Oral Care Recommendations: Oral care QID;Staff/trained caregiver to provide oral care (? if medication may be given to break up dried secretions) Follow Up Recommendations: No SLP follow up Assistance recommended at discharge: Frequent or constant Supervision/Assistance SLP Visit Diagnosis: Dysphagia, unspecified (R13.10) Plan: Other (Comment)         Kathleen Lime, MS [place] Office [phone] Pager [phone]   Macario Golds  03/20/2022, 1:42 PM

## 2022-03-21 DIAGNOSIS — R531 Weakness: Secondary | ICD-10-CM | POA: Diagnosis not present

## 2022-03-21 DIAGNOSIS — Z515 Encounter for palliative care: Secondary | ICD-10-CM | POA: Diagnosis not present

## 2022-03-21 DIAGNOSIS — Z7189 Other specified counseling: Secondary | ICD-10-CM | POA: Diagnosis not present

## 2022-03-21 MED ORDER — ORAL CARE MOUTH RINSE
15.0000 mL | Freq: Two times a day (BID) | OROMUCOSAL | Status: DC
  Administered 2022-03-21 – 2022-03-22 (×2): 15 mL via OROMUCOSAL

## 2022-03-21 MED ORDER — CHLORHEXIDINE GLUCONATE 0.12 % MT SOLN
15.0000 mL | Freq: Two times a day (BID) | OROMUCOSAL | Status: DC
  Administered 2022-03-21 – 2022-03-22 (×2): 15 mL via OROMUCOSAL
  Filled 2022-03-21 (×2): qty 15

## 2022-03-21 NOTE — Progress Notes (Signed)
Daily Progress Note   Patient Name: Jillian Cantrell       Date: 03/21/2022 DOB: [birth year]  Age: 88 y.o. MRN#: [id number] Attending Physician: Florencia Reasons, MD Primary Care Physician: System, Provider Not In Admit Date: 03/15/2022  Reason for Consultation/Follow-up: Establishing goals of care  Subjective: Patient is resting in bed, does not verbalize or open eyes today, discussed with family at bedside.    Length of Stay: 6  Current Medications: Scheduled Meds:   chlorhexidine  15 mL Mouth Rinse BID   Chlorhexidine Gluconate Cloth  6 each Topical Daily   heparin  5,000 Units Subcutaneous Q8H   mouth rinse  15 mL Mouth Rinse q12n4p   metoprolol tartrate  2.5 mg Intravenous Q12H    Continuous Infusions:  ampicillin (OMNIPEN) IV 2 g (03/21/22 1102)   dextrose 75 mL/hr at 03/21/22 0403    PRN Meds: acetaminophen **OR** acetaminophen, fentaNYL (SUBLIMAZE) injection, metoprolol tartrate, ondansetron **OR** ondansetron (ZOFRAN) IV  Physical Exam         General: Lying in bed with eyes open.      does not verbalize     Heart: Tachycardic, regular. Lungs: Fair air movement  Ext: No significant edema Skin: Warm and dry  Vital Signs: BP 111/70 (BP Location: Left Arm)   Pulse 89   Temp 97.6 F (36.4 C) (Oral)   Resp 20   Ht '5\' 8"'$  (1.727 m)   Wt 77.2 kg   SpO2 95%   BMI 25.88 kg/m  SpO2: SpO2: 95 % O2 Device: O2 Device: Room Air O2 Flow Rate: O2 Flow Rate (L/min): 1 L/min  Intake/output summary:  Intake/Output Summary (Last 24 hours) at 03/21/2022 1206 Last data filed at 03/21/2022 0900 Gross per 24 hour  Intake 1493.63 ml  Output 1650 ml  Net -156.37 ml    LBM: Last BM Date : 03/20/22 Baseline Weight: Weight: 81.6 kg Most recent weight: Weight: 77.2 kg       Palliative  Assessment/Data:    Flowsheet Rows    Flowsheet Row Most Recent Value  Intake Tab   Referral Department Hospitalist  Unit at Time of Referral ICU  Palliative Care Primary Diagnosis Sepsis/Infectious Disease  Date Notified 03/15/22  Palliative Care Type New Palliative care  Reason for referral Clarify Goals of Care  Date of Admission 03/15/22  Date first seen by Palliative Care 03/17/22  # of days Palliative referral response time 2 Day(s)  # of days IP prior to Palliative referral 0  Clinical Assessment   Palliative Performance Scale Score 20%  Psychosocial & Spiritual Assessment   Palliative Care Outcomes   Patient/Family meeting held? Yes  Who was at the meeting? 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 female  with past medical history of dementia, long-term skilled facility resident, mostly bedbound, recurrent UTIs, chronic A-fib not on anticoagulation due to history of GI bleed, hypertension, CVA, thyroid  cancer status post thyroidectomy, hypothyroidism, hyperlipidemia admitted on 03/15/2022 with altered mental status and fever despite being on IM Rocephin due to concern for UTI at facility.  Admission work-up revealed sepsis, acute encephalopathy, and AKI.  She was also tachycardic with known history of A-fib with RVR and also found to be hyponatremic.  Mental status has not really improved to this point.  Palliative consulted for goals of care.  Recommendations/Plan: DNR/DNI No feeding tube Goals of care discussions undertaken again with family (daughter in law ) at bedside.  We discussed about continuation of current scope of care, repeated SLP evaluations, continuation of antibiotics or proceeding with comfort care and residential hospice. Daughter in law present at bedside states that there are ongoing family discussions with regards to this decision. Recommend comfort care and residential hospice, comfort feeds only when patient is awake/alert, provision of pain and non pain symptom management and comfort/dignity care inside a residential hospice setting. PMT to continue to follow, family also asks for input from [facility] MD as well, will notify Dr Erlinda Hong.     Code Status:    Code Status Orders  (From admission, onward)           Start     Ordered   03/15/22 1656  Do not attempt resuscitation (DNR)  Continuous       Question Answer Comment  In the event of cardiac or respiratory ARREST Do not call a "code blue"   In the event of cardiac or respiratory ARREST Do not perform Intubation, CPR, defibrillation or ACLS   In the event of cardiac or respiratory ARREST Use medication by any route, position, wound care, and other measures to relive pain and suffering. May use oxygen, suction and manual treatment of airway obstruction as needed for comfort.      03/15/22 1657           Code Status History  Date Active Date Inactive Code Status Order ID Comments User Context   02/24/2017 1735  02/25/2017 2007 DNR [id number]  Nita Sells, MD Inpatient   05/30/2016 1728 06/01/2016 1727 Full Code [id number]  Kelvin Cellar, MD Inpatient   11/01/2015 1119 11/07/2015 1611 Full Code [id number]  Waldemar Dickens, MD ED      Advance Directive Documentation    [place] Most Recent Value  Type of Advance Directive Out of facility DNR (pink MOST or yellow form)  Pre-existing out of facility DNR order (yellow form or pink MOST form) --  "MOST" Form in Place? --       Prognosis: Likely less than 2 weeks.   Discharge Planning: Recommend residential hospice.   Care plan was discussed with family at bedside.   Thank you for allowing the Palliative Medicine Team to assist in the care of this patient.  Loistine Chance, MD  Please contact Palliative Medicine Team phone at [phone] for questions and concerns.

## 2022-03-21 NOTE — Progress Notes (Signed)
Nutrition Follow-up  DOCUMENTATION CODES:   Not applicable  INTERVENTION:  - will monitor for plan.   NUTRITION DIAGNOSIS:   Inadequate oral intake related to inability to eat as evidenced by NPO status. -ongoing  GOAL:   Patient will meet greater than or equal to 90% of their needs -unable to meet at this time.  MONITOR:   Diet advancement, PO intake, Labs, Weight trends  ASSESSMENT:   88 yo female admitted with AMS, fever, UTI. PMH includes dementia, mostly bedbound, recurrent UTIs, chronic A fib, HTN, CVA, thyroid cancer S/P thyroidectomy, hypothyroidism, HLD.  Patient laying in bed asleep with blankets up to chin. Her daughter-in-law was at bedside. Patient remains NPO. SLP note from yesterday indicates ok for liquids via teaspoon.  Weight stable since 6/3. Mild pitting edema to RUE and non-pitting edema to LUE and BLE documented in the edema section of flow sheet.    Labs reviewed; BUN: 27 mg/dl, Ca: 8.6 mg/dl, GFR: 53 ml/min. Medications reviewed.  IVF; D5 @ 75 ml/hr (306 kcal/24 hrs).    NUTRITION - FOCUSED PHYSICAL EXAM:  Deferred.   Diet Order:   Diet Order     None       EDUCATION NEEDS:   No education needs have been identified at this time  Skin:  Skin Assessment: Reviewed RN Assessment  Last BM:  6/7 (type 7 x1, large amount)  Height:   Ht Readings from Last 1 Encounters:  03/15/22 '5\' 8"'$  (1.727 m)    Weight:   Wt Readings from Last 1 Encounters:  03/21/22 77.2 kg     BMI:  Body mass index is 25.88 kg/m.  Estimated Nutritional Needs:  Kcal:  4492-0100 Protein:  90-100 gm Fluid:  >/= 1.8 L      Jarome Matin, MS, RD, LDN Registered Dietitian II Inpatient Clinical Nutrition RD pager # and on-call/weekend pager # available in [facility]

## 2022-03-21 NOTE — Progress Notes (Signed)
PROGRESS NOTE    Jillian Cantrell  YSA:[id number] DOB: 10/19/1926 DOA: 03/15/2022 PCP: System, Provider Not In     Brief Narrative:   h/o dementia currently residing in a facility, mostly bedbound, recurrent UTIs, chronic A-fib, not on anticoagulation because of history of GI bleed,  CVA with right hemiplegia, h/o  thyroid cancer status post thyroidectomy, hypothyroidism, who was sent from SNF for altered mental status and fever   Subjective:  Alert, nonverbal Does not appear to have meaning full recovery   Assessment & Plan:  Principal Problem:   UTI (urinary tract infection) Active Problems:   Atrial fibrillation (HCC)   Sepsis ([place])   Hypernatremia   AKI (acute kidney injury) ([place])   Leukocytosis   Thrombocytopenia ([place])   Palliative care by specialist   Goals of care, counseling/discussion   General weakness    Assessment and Plan:   Sepsis: Present on admission Recurrent UTI/acute metabolic encephalopathy -Presented with worsened mental status, fever, tachycardia, tachypnea, leukocytosis and possible UTI.  Patient was on IM Rocephin for the last few days at SNF. -COVID and influenza negative -Initially started on cefepime, Flagyl and vancomycin IV but switched to IV ampicillin on 03/18/2022 as urine culture grew Enterococcus faecalis, last dose abx on 6/8 -CT renal stone study showed bilateral renal pelvis staghorn calculi without hydronephrosis or ureteral calculus with scattered colonic diverticuli without acute diverticulitis -Blood cultures negative so far.       Acute metabolic encephalopathy/History of dementia, mostly bedbound -Patient normally is bedbound, not very responsive but normally eats okay as per the patient's daughter -She had been more drowsy over the last few days prior to presentation.   CT of the head without contrast was negative for acute intracranial abnormality. -Fall precautions. -Palliative care following. -If condition does not improve  in the next 24 to 48 hours, patient will benefit from hospice/comfort measures if family agrees. -SLP following and currently recommend NPO. -she open eyes , does not talk, appear to gesture to questions , does not follow commands   Chronic A-fib with RVR - Not on rate controlling agent or anticoagulation as an outpatient.  Start low dose  iv lopressor as she is not able to swallow pills for heart rate control as long as bp allows    Hypernatremia/Dehydration/aki - all improved on hydration, however, she is not safe to take oral intake , family does not plan for feeding tube Does not appear to have meaning full recovery     Hypomagnesemia -Replaced, improved    Hyperkalemia -Mild.  Remove potassium from IV fluids. K normalized   Thrombocytopenia -plt normalized    Hypertension - Antihypertensives on hold since admission, start low dose iv lopressor  for heart rate control as long as bp allows  Goals of care discussion Poor prognosis, no clinical improvement with treating infection and correcting abnormal labs Will benefit from residential hospice , as life expectance is likely less than 2weeks if she can not eat enough to sustain herself        Nutritional Assessment: The patient's BMI is: Body mass index is 25.88 kg/m.Marland Kitchen Seen by dietician.  I agree with the assessment and plan as outlined below: Nutrition Status: Nutrition Problem: Inadequate oral intake Etiology: inability to eat Signs/Symptoms: NPO status Interventions: Refer to RD note for recommendations      DVT prophylaxis: heparin injection 5,000 Units Start: 03/15/22 2200   Code Status:   Code Status: DNR  Family Communication: daughter over the phone on 6/7  and 6/8 Disposition:    Dispo: The patient is from: snf              Anticipated d/c is to: residential hospice when there is a bed. Transitional care informed                 Antimicrobials:    Anti-infectives (From admission, onward)     Start     Dose/Rate Route Frequency Ordered Stop   03/19/22 2200  vancomycin (VANCOREADY) IVPB 1750 mg/350 mL  Status:  Discontinued        1,750 mg 175 mL/hr over 120 Minutes Intravenous Every 48 hours 03/18/22 1406 03/18/22 1442   03/18/22 1530  ampicillin (OMNIPEN) 2 g in sodium chloride 0.9 % 100 mL IVPB        2 g 300 mL/hr over 20 Minutes Intravenous Every 8 hours 03/18/22 1442 03/21/22 2359   03/17/22 2200  vancomycin (VANCOREADY) IVPB 1500 mg/300 mL  Status:  Discontinued        1,500 mg 150 mL/hr over 120 Minutes Intravenous Every 48 hours 03/17/22 0934 03/18/22 1406   03/16/22 1800  ceFEPIme (MAXIPIME) 2 g in sodium chloride 0.9 % 100 mL IVPB  Status:  Discontinued        2 g 200 mL/hr over 30 Minutes Intravenous Every 24 hours 03/15/22 1753 03/18/22 1442   03/15/22 1944  vancomycin variable dose per unstable renal function (pharmacist dosing)  Status:  Discontinued         Does not apply See admin instructions 03/15/22 1944 03/17/22 0935   03/15/22 1730  vancomycin (VANCOREADY) IVPB 1500 mg/300 mL        1,500 mg 150 mL/hr over 120 Minutes Intravenous  Once 03/15/22 1719 03/15/22 2152   03/15/22 1715  ceFEPIme (MAXIPIME) 2 g in sodium chloride 0.9 % 100 mL IVPB        2 g 200 mL/hr over 30 Minutes Intravenous  Once 03/15/22 1710 03/15/22 1815   03/15/22 1715  metroNIDAZOLE (FLAGYL) IVPB 500 mg  Status:  Discontinued        500 mg 100 mL/hr over 60 Minutes Intravenous Every 12 hours 03/15/22 1712 03/18/22 1442   03/15/22 1645  piperacillin-tazobactam (ZOSYN) IVPB 3.375 g  Status:  Discontinued        3.375 g 100 mL/hr over 30 Minutes Intravenous  Once 03/15/22 1637 03/15/22 1710   03/15/22 1630  cefTRIAXone (ROCEPHIN) 2 g in sodium chloride 0.9 % 100 mL IVPB  Status:  Discontinued        2 g 200 mL/hr over 30 Minutes Intravenous Every 24 hours 03/15/22 1615 03/15/22 1637          Objective: Vitals:   03/21/22 0437 03/21/22 0525 03/21/22 1300 03/21/22 1451  BP: 111/70   119/79 119/79  Pulse: 89  84 95  Resp: '20  14 14  '$ Temp: 97.6 F (36.4 C)  97.6 F (36.4 C) 97.6 F (36.4 C)  TempSrc: Oral  Axillary Oral  SpO2: 95%  92% 95%  Weight:  77.2 kg    Height:        Intake/Output Summary (Last 24 hours) at 03/21/2022 1742 Last data filed at 03/21/2022 1500 Gross per 24 hour  Intake 2071.13 ml  Output 2100 ml  Net -28.87 ml   Filed Weights   03/17/22 0500 03/18/22 0500 03/21/22 0525  Weight: 77.6 kg 77.6 kg 77.2 kg    Examination:  General exam: appear nonverbal, but does response to questions  by gesturing, right hemiplegia, able to move left extremity some Respiratory system: Clear to auscultation. Respiratory effort normal. Cardiovascular system:  IRRR.  Gastrointestinal system: Abdomen is nondistended, soft and nontender.  Normal bowel sounds heard. Central nervous system: Alert , nonverbal,  Extremities:  no edema Skin: No rashes, lesions or ulcers Psychiatry: alert, non verbal, currently on agitation     Data Reviewed: I have personally reviewed  labs and visualized  imaging studies since the last encounter and formulate the plan        Scheduled Meds:  chlorhexidine  15 mL Mouth Rinse BID   Chlorhexidine Gluconate Cloth  6 each Topical Daily   heparin  5,000 Units Subcutaneous Q8H   mouth rinse  15 mL Mouth Rinse q12n4p   metoprolol tartrate  2.5 mg Intravenous Q12H   Continuous Infusions:  ampicillin (OMNIPEN) IV 2 g (03/21/22 1713)   dextrose 75 mL/hr at 03/21/22 1608     LOS: 6 days     Florencia Reasons, MD PhD FACP [facility]  Available via Epic secure chat 7am-7pm for nonurgent issues Please page for urgent issues To page the attending provider between 7A-7P or the covering provider during after hours 7P-7A, please log into the web site [email or web address] and access using universal [place] password for that web site. If you do not have the password, please call the hospital operator.    03/21/2022, 5:42 PM

## 2022-03-21 NOTE — TOC Progression Note (Signed)
Transition of Care [facility]) - Progression Note    Patient Details  Name: Jillian Cantrell MRN: [id number] Date of Birth: [birth year]  [facility] CM/SW Contact  Leeroy Cha, RN Phone Number: 03/21/2022, 8:21 AM  Clinical Narrative:    Pt is from [facility] in [place].  plan is to return to snf when stable.   Expected Discharge Plan: Long Term Nursing Home Barriers to Discharge: Continued Medical Work up  Expected Discharge Plan and Services Expected Discharge Plan: [place]   Discharge Planning Services: CM Consult   Living arrangements for the past 2 months: [place]                                       Social Determinants of Health (SDOH) Interventions    Readmission Risk Interventions     No data to display

## 2022-03-21 NOTE — Plan of Care (Signed)
  Problem: Urinary Elimination: Goal: Signs and symptoms of infection will decrease Outcome: Progressing   Problem: Clinical Measurements: Goal: Respiratory complications will improve Outcome: Progressing   Problem: Elimination: Goal: Will not experience complications related to bowel motility Outcome: Progressing   Problem: Pain Managment: Goal: General experience of comfort will improve Outcome: Progressing

## 2022-03-21 NOTE — Plan of Care (Signed)
  Problem: Activity: Goal: Risk for activity intolerance will decrease Outcome: Not Progressing   Problem: Nutrition: Goal: Adequate nutrition will be maintained Outcome: Not Progressing   Problem: Skin Integrity: Goal: Risk for impaired skin integrity will decrease Outcome: Progressing

## 2022-03-22 DIAGNOSIS — E870 Hyperosmolality and hypernatremia: Secondary | ICD-10-CM | POA: Diagnosis not present

## 2022-03-22 DIAGNOSIS — Z7189 Other specified counseling: Secondary | ICD-10-CM | POA: Diagnosis not present

## 2022-03-22 DIAGNOSIS — N179 Acute kidney failure, unspecified: Secondary | ICD-10-CM | POA: Diagnosis not present

## 2022-03-22 DIAGNOSIS — R627 Adult failure to thrive: Secondary | ICD-10-CM

## 2022-03-22 DIAGNOSIS — R531 Weakness: Secondary | ICD-10-CM | POA: Diagnosis not present

## 2022-03-22 DIAGNOSIS — Z515 Encounter for palliative care: Secondary | ICD-10-CM | POA: Diagnosis not present

## 2022-03-22 DIAGNOSIS — I482 Chronic atrial fibrillation, unspecified: Secondary | ICD-10-CM | POA: Diagnosis not present

## 2022-03-22 MED ORDER — HALOPERIDOL LACTATE 2 MG/ML PO CONC: 0.5000 mg | ORAL | Status: DC | PRN

## 2022-03-22 MED ORDER — LORAZEPAM 2 MG/ML PO CONC
1.0000 mg | ORAL | Status: DC | PRN
  Filled 2022-03-22: qty 0.5

## 2022-03-22 MED ORDER — HALOPERIDOL 0.5 MG PO TABS: 0.5000 mg | ORAL_TABLET | ORAL | Status: DC | PRN

## 2022-03-22 MED ORDER — BIOTENE DRY MOUTH MT LIQD: 15.0000 mL | OROMUCOSAL | Status: DC | PRN

## 2022-03-22 MED ORDER — LORAZEPAM 2 MG/ML IJ SOLN: 1.0000 mg | INTRAMUSCULAR | Status: DC | PRN

## 2022-03-22 MED ORDER — ONDANSETRON HCL 4 MG/2ML IJ SOLN: 4.0000 mg | Freq: Four times a day (QID) | INTRAMUSCULAR | Status: DC | PRN

## 2022-03-22 MED ORDER — ACETAMINOPHEN 325 MG PO TABS: 650.0000 mg | ORAL_TABLET | Freq: Four times a day (QID) | ORAL | Status: DC | PRN

## 2022-03-22 MED ORDER — GLYCOPYRROLATE 0.2 MG/ML IJ SOLN: 0.2000 mg | INTRAMUSCULAR | Status: DC | PRN

## 2022-03-22 MED ORDER — FENTANYL CITRATE PF 50 MCG/ML IJ SOSY
12.5000 ug | PREFILLED_SYRINGE | Freq: Four times a day (QID) | INTRAMUSCULAR | Status: DC
  Administered 2022-03-22: 12.5 ug via INTRAVENOUS

## 2022-03-22 MED ORDER — FENTANYL CITRATE PF 50 MCG/ML IJ SOSY
12.5000 ug | PREFILLED_SYRINGE | INTRAMUSCULAR | Status: DC | PRN
  Filled 2022-03-22: qty 1

## 2022-03-22 MED ORDER — ONDANSETRON 4 MG PO TBDP: 4.0000 mg | ORAL_TABLET | Freq: Four times a day (QID) | ORAL | Status: DC | PRN

## 2022-03-22 MED ORDER — GLYCOPYRROLATE 1 MG PO TABS: 1.0000 mg | ORAL_TABLET | ORAL | Status: DC | PRN

## 2022-03-22 MED ORDER — LORAZEPAM 1 MG PO TABS: 1.0000 mg | ORAL_TABLET | ORAL | Status: DC | PRN

## 2022-03-22 MED ORDER — ACETAMINOPHEN 650 MG RE SUPP: 650.0000 mg | Freq: Four times a day (QID) | RECTAL | Status: DC | PRN

## 2022-03-22 MED ORDER — HALOPERIDOL LACTATE 5 MG/ML IJ SOLN: 0.5000 mg | INTRAMUSCULAR | Status: DC | PRN

## 2022-03-22 MED ORDER — POLYVINYL ALCOHOL 1.4 % OP SOLN: 1.0000 [drp] | Freq: Four times a day (QID) | OPHTHALMIC | Status: DC | PRN

## 2022-03-22 NOTE — Care Management Important Message (Signed)
Important Message  Patient Details IM Letter placed in Patient room for Daughter Pam. Name: Jillian Cantrell MRN: [id number] Date of Birth: Sep 18, 1927   Medicare Important Message Given:  Yes     Kerin Salen 03/22/2022, 11:51 AM

## 2022-03-22 NOTE — Discharge Summary (Signed)
Discharge Summary  Jillian Cantrell GGY:[id number] DOB: 10/06/1927  PCP: System, Provider Not In  Admit date: 03/15/2022 Discharge date: 03/22/2022  Time spent: 57mns, more than 50% time spent on coordination of care.   Discharge to residential hospice, on full comfort measures    History of present illness: ( per admitting MD Dr AStarla Link Chief Complaint: Altered mental status   HPI: Jillian RACHELis a 88y.o. female with medical history significant of dementia currently residing in a facility, mostly bedbound, recurrent UTIs, chronic A-fib, not on anticoagulation because of history of GI bleed, hypertension, CVA, thyroid cancer status post thyroidectomy, hypothyroidism, hyperlipidemia who was sent from SNF for altered mental status and fever.  Patient is currently nonverbal and only moans to painful stimuli and hence other history cannot be obtained.  I have taken some history from the daughter at bedside and have discussed with the ED provider myself.  I have reviewed patient's medical records myself as well.  Patient was reportedly being treated for UTI at her facility since 03/12/2022 with intramuscular Rocephin.  Patient apparently has been more drowsy over the last day.  Reportedly, patient is slightly more alert and is able to eat normally.   ED Course: Patient was febrile, tachycardic and almost nonverbal.  She was found to have UTI, mild leukocytosis, hypernatremia.  She was started on IV fluids and antibiotics.  Chest x-ray was negative for infiltrates. Hospitalist service was called to evaluate the patient.    Hospital Course:  Principal Problem:   UTI (urinary tract infection) Active Problems:   Atrial fibrillation (HCC)   Sepsis (HCC)   Hypernatremia   AKI (acute kidney injury) ([place]   Leukocytosis   Thrombocytopenia (HCC)   Palliative care by specialist   Goals of care, counseling/discussion   General weakness  Acute metabolic encephalopathy/History of dementia, mostly  bedbound -Patient normally is bedbound, not very responsive but normally eats okay as per the patient's daughter -She had been more drowsy over the last few days prior to presentation.   CT of the head without contrast was negative for acute intracranial abnormality. -no clinical improvement after treating infection and metabolic abnormalities -seen by speech and palliative care,  patient will benefit from hospice/comfort measures , now  family agrees. -transition to full comfort measures and residential hospice placement    Sepsis: Present on admission Recurrent UTI/acute metabolic encephalopathy -Presented with worsened mental status, fever, tachycardia, tachypnea, leukocytosis and possible UTI.  Patient was on IM Rocephin for the last few days at SNF. -COVID and influenza negative -Initially started on cefepime, Flagyl and vancomycin IV but switched to IV ampicillin on 03/18/2022 as urine culture grew Enterococcus faecalis, last dose abx on 6/8 -CT renal stone study showed bilateral renal pelvis staghorn calculi without hydronephrosis or ureteral calculus with scattered colonic diverticuli without acute diverticulitis -Blood cultures negative . --transition to full comfort measures and residential hospice placement     Hypernatremia/Dehydration/aki/hypomagnesemia - all improved on hydration, however, she is not safe to take oral intake , family does not plan for feeding tube Does not appear to have meaning full recovery  Thrombocytopenia -plt normalized   Other chronic medical issues: Chronic A-fib with RVR HTN      Discharge Exam: BP 121/82 (BP Location: Left Arm)   Pulse 98   Temp (!) 97.5 F (36.4 C)   Resp 14   Ht '5\' 8"'$  (1.727 m)   Wt 77 kg   SpO2 95%   BMI 25.81 kg/m  General: alert, non  verbal  Cardiovascular: IRRR Respiratory: on room air, no hypoxia    Discharge Instructions     Diet general   Complete by: As directed    Comfort feeds      Allergies as  of 03/22/2022       Reactions   Sulfa Antibiotics Other (See Comments)   Reaction:  Unknown   Codeine Hives        Medication List     STOP taking these medications    apixaban 5 MG Tabs tablet Commonly known as: Eliquis   atorvastatin 10 MG tablet Commonly known as: LIPITOR   brimonidine 0.15 % ophthalmic solution Commonly known as: ALPHAGAN   Bromfenac Sodium 0.075 % Soln   cefTRIAXone 1 g injection Commonly known as: ROCEPHIN   cyclobenzaprine 5 MG tablet Commonly known as: FLEXERIL   diclofenac 1.3 % Ptch Commonly known as: FLECTOR   diltiazem 120 MG tablet Commonly known as: CARDIZEM   diltiazem 60 MG 12 hr capsule Commonly known as: CARDIZEM SR   divalproex 125 MG capsule Commonly known as: DEPAKOTE SPRINKLE   DUREZOL OP   escitalopram 10 MG tablet Commonly known as: LEXAPRO   latanoprost 0.005 % ophthalmic solution Commonly known as: XALATAN   levothyroxine 125 MCG tablet Commonly known as: SYNTHROID   levothyroxine 150 MCG tablet Commonly known as: SYNTHROID   NON FORMULARY   NON FORMULARY   NON FORMULARY   spironolactone 50 MG tablet Commonly known as: ALDACTONE       Allergies  Allergen Reactions   Sulfa Antibiotics Other (See Comments)    Reaction:  Unknown   Codeine Hives      The results of significant diagnostics from this hospitalization (including imaging, microbiology, ancillary and laboratory) are listed below for reference.    Significant Diagnostic Studies: CT Head Wo Contrast  Result Date: 03/15/2022 CLINICAL DATA:  Mental status changes. EXAM: CT HEAD WITHOUT CONTRAST TECHNIQUE: Contiguous axial images were obtained from the base of the skull through the vertex without intravenous contrast. RADIATION DOSE REDUCTION: This exam was performed according to the departmental dose-optimization program which includes automated exposure control, adjustment of the mA and/or kV according to patient size and/or use of  iterative reconstruction technique. COMPARISON:  12/15/2019 FINDINGS: Brain: Calcified meningiomas about both frontal convexities are similar including on the order of 1.1 and 1.2 cm. Left frontoparietal encephalomalacia from remote infarct is not significantly changed. Ex vacuo dilatation of the lateral ventricles secondary to atrophy and encephalomalacia. Advanced cerebral and cerebellar volume loss. No hemorrhage, acute infarct, mass lesion, intra-axial, or extra-axial fluid collection. Vascular: No hyperdense vessel or unexpected calcification. Intracranial atherosclerosis. Skull: Normal Sinuses/Orbits: Surgical changes about both globes. Clear paranasal sinuses and mastoid air cells. Other: None. IMPRESSION: 1. No acute intracranial abnormality. 2. Remote left frontoparietal infarct. 3. Cerebral/cerebellar volume loss. 4. Similar calcified meningiomas about both frontal convexities. Electronically Signed   By: Abigail Miyamoto M.D.   On: 03/15/2022 17:39   CT Renal Stone Study  Result Date: 03/15/2022 CLINICAL DATA:  Flank pain, kidney stone suspected. Altered mental status. EXAM: CT ABDOMEN AND PELVIS WITHOUT CONTRAST TECHNIQUE: Multidetector CT imaging of the abdomen and pelvis was performed following the standard protocol without IV contrast. RADIATION DOSE REDUCTION: This exam was performed according to the departmental dose-optimization program which includes automated exposure control, adjustment of the mA and/or kV according to patient size and/or use of iterative reconstruction technique. COMPARISON:  CT examination dated May 30, 2016 FINDINGS: Lower  chest: Bibasilar dependent atelectasis. Coronary artery atherosclerotic disease. Hepatobiliary: No focal liver abnormality is seen. Status post cholecystectomy. No biliary dilatation. Pancreas: Unremarkable. No pancreatic ductal dilatation or surrounding inflammatory changes. Spleen: Normal in size without focal abnormality. Adrenals/Urinary Tract:  Adrenal glands are unremarkable. There are 2 large calculi in the right renal pelvis measuring up to 1.6 cm. Right ureter is normal in caliber. No ureteral stone. There is also staghorn calculus in the left renal pelvis measuring up to 2 cm. Left ureter is also normal in caliber without evidence of hydronephrosis. Mild nonspecific bilateral perinephric fat stranding. Bladder is unremarkable. Stomach/Bowel: Stomach is within normal limits. Appendix not identified. No evidence of bowel wall thickening, distention, or inflammatory changes. Scattered colonic diverticuli without evidence of acute diverticulitis. Vascular/Lymphatic: Aortic atherosclerosis. No enlarged abdominal or pelvic lymph nodes. Reproductive: Status post hysterectomy. No adnexal masses. Other: No abdominal wall hernia or abnormality. No abdominopelvic ascites. Musculoskeletal: Osteopenia and degenerate disc disease of the lumbar spine. No acute osseous abnormality. Generalized muscle atrophy. IMPRESSION: 1. Nephrolithiasis, with bilateral renal pelvis staghorn calculi without evidence of hydronephrosis or ureteral calculus. 2.  Urinary bladder is unremarkable. 3. Scattered colonic diverticuli without evidence of acute diverticulitis. Bowel loops are normal in caliber. 3. Additional chronic findings as above. No CT evidence of acute abdominal/pelvic process. Electronically Signed   By: Keane Police D.O.   On: 03/15/2022 16:57   DG Chest 2 View  Result Date: 03/15/2022 CLINICAL DATA:  Possible sepsis. EXAM: CHEST - 2 VIEW COMPARISON:  Chest x-ray dated December 15, 2019. FINDINGS: Unchanged mild cardiomegaly. Normal pulmonary vascularity. No focal consolidation, pleural effusion, or pneumothorax. No acute osseous abnormality. End-stage left glenohumeral osteoarthritis again noted. IMPRESSION: 1. No acute cardiopulmonary disease. Electronically Signed   By: Titus Dubin M.D.   On: 03/15/2022 14:44    Microbiology: Recent Results (from the past 240  hour(s))  Culture, blood (Routine x 2)     Status: None   Collection Time: 03/15/22  2:03 PM   Specimen: BLOOD  Result Value Ref Range Status   Specimen Description   Final    BLOOD BLOOD RIGHT WRIST Performed at [facility] [address] [place], [place] [address]    Special Requests   Final    BOTTLES DRAWN AEROBIC AND ANAEROBIC Blood Culture results may not be optimal due to an inadequate volume of blood received in culture bottles Performed at [facility] [address] [place], [place] [address]    Culture   Final    NO GROWTH 5 DAYS Performed at [facility], [place] [address], [place], [place] [address]    Report Status 03/20/2022 FINAL  Final  Urine Culture     Status: Abnormal   Collection Time: 03/15/22  2:03 PM   Specimen: Urine, Catheterized  Result Value Ref Range Status   Specimen Description   Final    URINE, CATHETERIZED Performed at [facility] [address] [place], [place] [address]    Special Requests   Final    NONE Performed at [facility], [place] [address] [place], [place] [address]    Culture >=100,000 COLONIES/mL ENTEROCOCCUS FAECALIS (A)  Final   Report Status 03/18/2022 FINAL  Final   Organism ID, Bacteria ENTEROCOCCUS FAECALIS (A)  Final      Susceptibility   Enterococcus faecalis - MIC*    AMPICILLIN <=2 SENSITIVE Sensitive     NITROFURANTOIN <=16 SENSITIVE Sensitive  VANCOMYCIN 1 SENSITIVE Sensitive     * >=100,000 COLONIES/mL ENTEROCOCCUS FAECALIS  Culture, blood (Routine x 2)     Status: None   Collection Time: 03/15/22  2:12 PM   Specimen: BLOOD  Result Value Ref Range Status   Specimen Description   Final    BLOOD RIGHT ANTECUBITAL Performed at [facility] [address], [place], [place] [address]    Special Requests   Final    BOTTLES DRAWN AEROBIC AND ANAEROBIC Blood Culture results may not be optimal due to an  inadequate volume of blood received in culture bottles Performed at [facility] [address], [place], [place] [address]    Culture   Final    NO GROWTH 5 DAYS Performed at [facility], [place] [address] [place], [place] [address]    Report Status 03/20/2022 FINAL  Final  SARS Coronavirus 2 by RT PCR (hospital order, performed in [facility] hospital lab) *cepheid single result test* Anterior Nasal Swab     Status: None   Collection Time: 03/15/22  4:45 PM   Specimen: Anterior Nasal Swab  Result Value Ref Range Status   SARS Coronavirus 2 by RT PCR NEGATIVE NEGATIVE Final    Comment: (NOTE) SARS-CoV-2 target nucleic acids are NOT DETECTED.  The SARS-CoV-2 RNA is generally detectable in upper and lower respiratory specimens during the acute phase of infection. The lowest concentration of SARS-CoV-2 viral copies this assay can detect is 250 copies / mL. A negative result does not preclude SARS-CoV-2 infection and should not be used as the sole basis for treatment or other patient management decisions.  A negative result may occur with improper specimen collection / handling, submission of specimen other than nasopharyngeal swab, presence of viral mutation(s) within the areas targeted by this assay, and inadequate number of viral copies (<250 copies / mL). A negative result must be combined with clinical observations, patient history, and epidemiological information.  Fact Sheet for Patients:   https://www.patel.info/  Fact Sheet for Healthcare Providers: https://hall.com/  This test is not yet approved or  cleared by the [place] FDA and has been authorized for detection and/or diagnosis of SARS-CoV-2 by FDA under an Emergency Use Authorization (EUA).  This EUA will remain in effect (meaning this test can be used) for the duration of the COVID-19 declaration under Section 564(b)(1) of the Act, 21  U.S.C. section 360bbb-3(b)(1), unless the authorization is terminated or revoked sooner.  Performed at [facility], [place] [address] [place], [place] [address]   MRSA Next Gen by PCR, Nasal     Status: None   Collection Time: 03/15/22 11:01 PM   Specimen: Nasal Mucosa; Nasal Swab  Result Value Ref Range Status   MRSA by PCR Next Gen NOT DETECTED NOT DETECTED Final    Comment: (NOTE) The GeneXpert MRSA Assay (FDA approved for NASAL specimens only), is one component of a comprehensive MRSA colonization surveillance program. It is not intended to diagnose MRSA infection nor to guide or monitor treatment for MRSA infections. Test performance is not FDA approved in patients less than 23 years old. Performed at [facility], [place] [address] [place], [place] [address]      Labs: Basic Metabolic Panel: Recent Labs  Lab 03/16/22 0255 03/17/22 0702 03/18/22 0259 03/19/22 0408 03/20/22 0336  NA 152* 150* 148* 143 138  K 3.1* 4.0 4.0 5.3* 4.2  CL 123* 121* 119* 114* 111  CO2 24 26  24 24 20*  GLUCOSE 121* 157* 131* 129* 122*  BUN 75* 63* 47* 37* 27*  CREATININE 1.68* 1.39* 1.12* 1.15* 0.99  CALCIUM 9.1 9.4 9.2 8.8* 8.6*  MG 2.2 2.0 1.8 1.6* 2.1   Liver Function Tests: Recent Labs  Lab 03/15/22 1403 03/16/22 0255 03/17/22 0702 03/18/22 0259  AST '26 20 20 28  '$ ALT '16 12 13 13  '$ ALKPHOS 178* 141* 151* 157*  BILITOT 1.0 0.6 0.6 0.7  PROT 7.0 5.3* 5.4* 5.9*  ALBUMIN 2.7* 2.0* 2.1* 2.2*   No results for input(s): "LIPASE", "AMYLASE" in the last 168 hours. No results for input(s): "AMMONIA" in the last 168 hours. CBC: Recent Labs  Lab 03/15/22 1403 03/15/22 1822 03/16/22 0255 03/17/22 0702 03/18/22 0259 03/19/22 0408 03/20/22 0336  WBC 11.4*   < > 9.7 11.4* 13.9* 12.1* 12.3*  NEUTROABS 9.0*  --   --  8.0* 9.4* 7.6 10.0*  HGB 15.6*   < > 12.4 13.6 13.7 13.7 12.8  HCT 47.6*   < > 39.0 42.8 43.3 42.7 39.2  MCV 95.8   < > 97.7 98.6  98.2 96.0 95.8  PLT 153   < > 129* 146* 163 125* 171   < > = values in this interval not displayed.   Cardiac Enzymes: No results for input(s): "CKTOTAL", "CKMB", "CKMBINDEX", "TROPONINI" in the last 168 hours. BNP: BNP (last 3 results) No results for input(s): "BNP" in the last 8760 hours.  ProBNP (last 3 results) No results for input(s): "PROBNP" in the last 8760 hours.  CBG: Recent Labs  Lab 03/15/22 2218  GLUCAP 109*    Signed:  Florencia Reasons MD, PhD, FACP  [facility] 03/22/2022, 12:08 PM

## 2022-03-22 NOTE — Progress Notes (Signed)
   This pt was referred to [facility] facility. I have reviewed chart, spoken to the daughter at work and confirmed goals for comfort care and reviewed with my MD at [facility]. She has been approved for hospice care. The daughter has accepted the bed offer and we can take her today if MD in agreement with d/c.   Webb Silversmith RN [phone]

## 2022-03-22 NOTE — Progress Notes (Signed)
Daily Progress Note   Patient Name: Jillian Cantrell       Date: 03/22/2022 DOB: 1927-07-28  Age: 88 y.o. MRN#: [id number] Attending Physician: Florencia Reasons, MD Primary Care Physician: System, Provider Not In Admit Date: 03/15/2022  Reason for Consultation/Follow-up: Establishing goals of care  Subjective: Patient is resting in bed, opens eyes when name is called, appears more flushed today, has shallow breathing, family - daughter Jillian Cantrell at bedside. See below.    Length of Stay: 7  Current Medications: Scheduled Meds:   chlorhexidine  15 mL Mouth Rinse BID   Chlorhexidine Gluconate Cloth  6 each Topical Daily   fentaNYL (SUBLIMAZE) injection  12.5 mcg Intravenous Q6H   mouth rinse  15 mL Mouth Rinse q12n4p    Continuous Infusions:    PRN Meds: acetaminophen **OR** acetaminophen, antiseptic oral rinse, fentaNYL (SUBLIMAZE) injection, glycopyrrolate **OR** glycopyrrolate **OR** glycopyrrolate, haloperidol **OR** haloperidol **OR** haloperidol lactate, LORazepam **OR** LORazepam **OR** LORazepam, metoprolol tartrate, ondansetron **OR** ondansetron (ZOFRAN) IV, polyvinyl alcohol  Physical Exam         General: Lying in bed with eyes open.      does not verbalize     Heart: Tachycardic, regular. Lungs: Fair air movement shallow breathing.  Ext: No significant edema Skin: Warm and dry  Vital Signs: BP 135/79 (BP Location: Left Arm)   Pulse (!) 101   Temp 98.5 F (36.9 C) (Oral)   Resp 18   Ht '5\' 8"'$  (1.727 m)   Wt 77 kg   SpO2 95%   BMI 25.81 kg/m  SpO2: SpO2: 95 % O2 Device: O2 Device: Room Air O2 Flow Rate: O2 Flow Rate (L/min): 1 L/min  Intake/output summary:  Intake/Output Summary (Last 24 hours) at 03/22/2022 1056 Last data filed at 03/22/2022 0400 Gross per 24 hour  Intake  1747.04 ml  Output 850 ml  Net 897.04 ml    LBM: Last BM Date : 03/20/22 Baseline Weight: Weight: 81.6 kg Most recent weight: Weight: 77 kg       Palliative Assessment/Data:    Flowsheet Rows    Flowsheet Row Most Recent Value  Intake Tab   Referral Department Hospitalist  Unit at Time of Referral ICU  Palliative Care Primary Diagnosis Sepsis/Infectious Disease  Date Notified 03/15/22  Palliative Care Type New Palliative care  Reason for  referral Clarify Goals of Care  Date of Admission 03/15/22  Date first seen by Palliative Care 03/17/22  # of days Palliative referral response time 2 Day(s)  # of days IP prior to Palliative referral 0  Clinical Assessment   Palliative Performance Scale Score 20%  Psychosocial & Spiritual Assessment   Palliative Care Outcomes   Patient/Family meeting held? Yes  Who was at the meeting? 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 female  with past medical history of dementia,  long-term skilled facility resident, mostly bedbound, recurrent UTIs, chronic A-fib not on anticoagulation due to history of GI bleed, hypertension, CVA, thyroid cancer status post thyroidectomy, hypothyroidism, hyperlipidemia admitted on 03/15/2022 with altered mental status and fever despite being on IM Rocephin due to concern for UTI at facility.  Admission work-up revealed sepsis, acute encephalopathy, and AKI.  She was also tachycardic with known history of A-fib with RVR and also found to be hyponatremic.  Mental status has not really improved to this point.  Palliative consulted for goals of care.  Recommendations/Plan: DNR/DNI No feeding tube Goals of care discussions undertaken again with family HCPOA daughter Jillian Cantrell at bedside:  Comfort care  D/C IVF and IV antibiotics and all other interventions not contributing to comfort measures at this point.  Transfer to residential hospice.     Code Status:    Code Status Orders  (From admission, onward)           Start     Ordered   03/15/22 1656  Do not attempt resuscitation (DNR)  Continuous       Question Answer Comment  In the event of cardiac or respiratory ARREST Do not call a "code blue"   In the event of cardiac or respiratory ARREST Do not perform Intubation, CPR, defibrillation or ACLS   In the event of cardiac or respiratory ARREST Use medication by any route, position, wound care, and other measures to relive pain and suffering. May use oxygen, suction and manual treatment of airway obstruction as needed for comfort.      03/15/22 1657           Code Status History     Date Active Date Inactive Code Status Order ID Comments User Context   02/24/2017 1735 02/25/2017 2007 DNR [id number]  Nita Sells, MD Inpatient   05/30/2016 1728 06/01/2016 1727 Full Code [id number]  Kelvin Cellar, MD Inpatient   11/01/2015 1119 11/07/2015 1611 Full Code [id number]  Waldemar Dickens,  MD ED      Advance Directive Documentation     Flowsheet Row Most Recent Value  Type of Advance Directive Out of facility DNR (pink MOST or yellow form)  Pre-existing out of facility DNR order (yellow form or pink MOST form) --  "MOST" Form in Place? --       Prognosis:  less than 2 weeks.   Discharge Planning: Recommend residential hospice.   Care plan was discussed with family at bedside.   Thank you for allowing the Palliative Medicine Team to assist in the care of this patient.  Loistine Chance, MD  Please contact Palliative Medicine Team phone at [phone] for questions and concerns.

## 2022-03-22 NOTE — Progress Notes (Signed)
Pt discharged to hospice house with IV. [facility] has just arrived to transport pt.  [name]

## 2022-03-22 NOTE — Plan of Care (Signed)
  Problem: Elimination: Goal: Will not experience complications related to bowel motility Outcome: Progressing Goal: Will not experience complications related to urinary retention Outcome: Progressing   Problem: Pain Managment: Goal: General experience of comfort will improve Outcome: Progressing   

## 2022-03-22 NOTE — TOC Progression Note (Addendum)
Transition of Care [facility]) - Progression Note    Patient Details  Name: Jillian Cantrell MRN: [id number] Date of Birth: 1927-09-27  Transition of Care [facility]) CM/SW Contact  Leeroy Cha, RN Phone Number: 03/22/2022, 9:48 AM  Clinical Narrative:    Family has requested [facility] [place] [facility] will contact. [facility] on [address] in [place].  Will have rep. Call back to take intake. Response for cheri kennedy/trying to get in touch with the daughter. PATIENT APPROVED TO GO TO [place].  AWAITING SIGNED PAPERS BY THE DAUGHTER, Ptar to be called when done. Transport packet given to unit sec. Expected Discharge Plan: Long Term Nursing Home Barriers to Discharge: Continued Medical Work up  Expected Discharge Plan and Services Expected Discharge Plan: [place]   Discharge Planning Services: CM Consult   Living arrangements for the past 2 months: [place] Expected Discharge Date: 03/22/22                                     Social Determinants of Health (SDOH) Interventions    Readmission Risk Interventions     No data to display

## 2022-03-22 NOTE — Plan of Care (Signed)
  Problem: Urinary Elimination: Goal: Signs and symptoms of infection will decrease 03/22/2022 1610 by [name], RN Outcome: Adequate for Discharge 03/22/2022 1610 by [name], RN Outcome: Progressing   Problem: Education: Goal: Knowledge of General Education information will improve Description: Including pain rating scale, medication(s)/side effects and non-pharmacologic comfort measures 03/22/2022 1610 by [name], RN Outcome: Adequate for Discharge 03/22/2022 1610 by [name], RN Outcome: Progressing   Problem: Health Behavior/Discharge Planning: Goal: Ability to manage health-related needs will improve 03/22/2022 1610 by [name], RN Outcome: Adequate for Discharge 03/22/2022 1610 by [name], RN Outcome: Progressing   Problem: Clinical Measurements: Goal: Ability to maintain clinical measurements within normal limits will improve Outcome: Adequate for Discharge Goal: Will remain free from infection Outcome: Adequate for Discharge Goal: Diagnostic test results will improve Outcome: Adequate for Discharge Goal: Respiratory complications will improve Outcome: Adequate for Discharge Goal: Cardiovascular complication will be avoided Outcome: Adequate for Discharge   Problem: Nutrition: Goal: Adequate nutrition will be maintained Outcome: Adequate for Discharge   Problem: Activity: Goal: Risk for activity intolerance will decrease Outcome: Adequate for Discharge   Problem: Coping: Goal: Level of anxiety will decrease Outcome: Adequate for Discharge   Problem: Elimination: Goal: Will not experience complications related to bowel motility Outcome: Adequate for Discharge Goal: Will not experience complications related to urinary retention Outcome: Adequate for Discharge   Problem: Pain Managment: Goal: General experience of comfort will improve Outcome: Adequate for Discharge   Problem: Safety: Goal: Ability to remain free from  injury will improve Outcome: Adequate for Discharge   Problem: Skin Integrity: Goal: Risk for impaired skin integrity will decrease Outcome: Adequate for Discharge   Problem: Inadequate Intake (NI-2.1) Goal: Food and/or nutrient delivery Description: Individualized approach for food/nutrient provision. Outcome: Adequate for Discharge   Problem: SLP Dysphagia Goals Goal: Patient will demonstrate readiness for PO's Description: Patient will demonstrate readiness for PO's and/or instrumental swallow study as evidenced by: Outcome: Adequate for Discharge

## 2022-03-22 NOTE — Progress Notes (Signed)
Report called to [place], [phone]. Report given to Blenda Peals, RN at [phone].  [name]

## 2022-03-22 NOTE — Plan of Care (Signed)
  Problem: Urinary Elimination: Goal: Signs and symptoms of infection will decrease Outcome: Progressing   Problem: Education: Goal: Knowledge of General Education information will improve Description: Including pain rating scale, medication(s)/side effects and non-pharmacologic comfort measures Outcome: Progressing   Problem: Health Behavior/Discharge Planning: Goal: Ability to manage health-related needs will improve Outcome: Progressing

## 2022-03-22 NOTE — TOC Transition Note (Signed)
Transition of Care [facility]) - CM/SW Discharge Note   Patient Details  Name: Jillian Cantrell MRN: [id number] Date of Birth: May 20, 1927  Transition of Care [facility]) CM/SW Contact:  Leeroy Cha, RN Phone Number: 03/22/2022, 12:43 PM   Clinical Narrative:    Tct-ptar for transport to [facility] in [place] at 1242.     Barriers to Discharge: Continued Medical Work up   Patient Goals and CMS Choice Patient states their goals for this hospitalization and ongoing recovery are:: unable to state family is looking into [place] goals CMS Medicare.gov Compare Post Acute Care list provided to:: Patient Represenative (must comment) (daughter) Choice offered to / list presented to : Adult Children  Discharge Placement                       Discharge Plan and Services   Discharge Planning Services: CM Consult                                 Social Determinants of Health (SDOH) Interventions     Readmission Risk Interventions     No data to display

## 2022-04-13 DEATH — deceased
